# Patient Record
Sex: Female | Born: 1960 | Race: White | Hispanic: No | Marital: Married | Smoking: Never smoker
Health system: Southern US, Community
[De-identification: ages and names within clinical notes are randomized; demographics above are authoritative.]

## PROBLEM LIST (undated history)

## (undated) DIAGNOSIS — K219 Gastro-esophageal reflux disease without esophagitis: Secondary | ICD-10-CM

## (undated) DIAGNOSIS — R319 Hematuria, unspecified: Secondary | ICD-10-CM

## (undated) DIAGNOSIS — Z87898 Personal history of other specified conditions: Secondary | ICD-10-CM

## (undated) DIAGNOSIS — E559 Vitamin D deficiency, unspecified: Secondary | ICD-10-CM

## (undated) DIAGNOSIS — N189 Chronic kidney disease, unspecified: Secondary | ICD-10-CM

## (undated) DIAGNOSIS — F329 Major depressive disorder, single episode, unspecified: Secondary | ICD-10-CM

## (undated) DIAGNOSIS — R5383 Other fatigue: Secondary | ICD-10-CM

## (undated) DIAGNOSIS — F419 Anxiety disorder, unspecified: Secondary | ICD-10-CM

## (undated) DIAGNOSIS — K760 Fatty (change of) liver, not elsewhere classified: Secondary | ICD-10-CM

## (undated) DIAGNOSIS — E785 Hyperlipidemia, unspecified: Secondary | ICD-10-CM

## (undated) DIAGNOSIS — R232 Flushing: Secondary | ICD-10-CM

## (undated) DIAGNOSIS — N6320 Unspecified lump in the left breast, unspecified quadrant: Secondary | ICD-10-CM

## (undated) DIAGNOSIS — N83209 Unspecified ovarian cyst, unspecified side: Secondary | ICD-10-CM

## (undated) DIAGNOSIS — F32A Depression, unspecified: Secondary | ICD-10-CM

## (undated) DIAGNOSIS — N949 Unspecified condition associated with female genital organs and menstrual cycle: Secondary | ICD-10-CM

## (undated) DIAGNOSIS — K589 Irritable bowel syndrome without diarrhea: Secondary | ICD-10-CM

## (undated) DIAGNOSIS — M5126 Other intervertebral disc displacement, lumbar region: Secondary | ICD-10-CM

## (undated) DIAGNOSIS — R1032 Left lower quadrant pain: Secondary | ICD-10-CM

## (undated) HISTORY — DX: Major depressive disorder, single episode, unspecified: F32.9

## (undated) HISTORY — DX: Fatty (change of) liver, not elsewhere classified: K76.0

## (undated) HISTORY — DX: Left lower quadrant pain: R10.32

## (undated) HISTORY — DX: Irritable bowel syndrome, unspecified: K58.9

## (undated) HISTORY — DX: Anxiety disorder, unspecified: F41.9

## (undated) HISTORY — DX: Other intervertebral disc displacement, lumbar region: M51.26

## (undated) HISTORY — DX: Hematuria, unspecified: R31.9

## (undated) HISTORY — DX: Depression, unspecified: F32.A

## (undated) HISTORY — DX: Unspecified condition associated with female genital organs and menstrual cycle: N94.9

## (undated) HISTORY — PX: TUBAL LIGATION: SHX77

## (undated) HISTORY — DX: Chronic kidney disease, unspecified: N18.9

## (undated) HISTORY — DX: Hyperlipidemia, unspecified: E78.5

## (undated) HISTORY — DX: Other fatigue: R53.83

## (undated) HISTORY — DX: Flushing: R23.2

## (undated) HISTORY — DX: Vitamin D deficiency, unspecified: E55.9

## (undated) HISTORY — DX: Unspecified lump in the left breast, unspecified quadrant: N63.20

## (undated) HISTORY — PX: OTHER SURGICAL HISTORY: SHX169

## (undated) HISTORY — PX: ENDOMETRIAL ABLATION: SHX621

## (undated) HISTORY — DX: Unspecified ovarian cyst, unspecified side: N83.209

## (undated) HISTORY — DX: Personal history of other specified conditions: Z87.898

---

## 1996-04-03 DIAGNOSIS — D229 Melanocytic nevi, unspecified: Secondary | ICD-10-CM

## 1996-04-03 HISTORY — DX: Melanocytic nevi, unspecified: D22.9

## 2001-01-03 ENCOUNTER — Other Ambulatory Visit: Admission: RE | Admit: 2001-01-03 | Discharge: 2001-01-03 | Payer: Self-pay | Admitting: Obstetrics and Gynecology

## 2001-12-19 ENCOUNTER — Encounter: Payer: Self-pay | Admitting: Obstetrics and Gynecology

## 2001-12-19 ENCOUNTER — Ambulatory Visit (HOSPITAL_COMMUNITY): Admission: RE | Admit: 2001-12-19 | Discharge: 2001-12-19 | Payer: Self-pay | Admitting: Obstetrics and Gynecology

## 2001-12-22 DIAGNOSIS — C4492 Squamous cell carcinoma of skin, unspecified: Secondary | ICD-10-CM

## 2001-12-22 HISTORY — DX: Squamous cell carcinoma of skin, unspecified: C44.92

## 2003-01-01 ENCOUNTER — Ambulatory Visit (HOSPITAL_COMMUNITY): Admission: RE | Admit: 2003-01-01 | Discharge: 2003-01-01 | Payer: Self-pay | Admitting: Obstetrics and Gynecology

## 2003-01-01 ENCOUNTER — Encounter: Payer: Self-pay | Admitting: Obstetrics and Gynecology

## 2004-01-07 ENCOUNTER — Ambulatory Visit (HOSPITAL_COMMUNITY): Admission: RE | Admit: 2004-01-07 | Discharge: 2004-01-07 | Payer: Self-pay | Admitting: Obstetrics and Gynecology

## 2005-01-11 ENCOUNTER — Ambulatory Visit (HOSPITAL_COMMUNITY): Admission: RE | Admit: 2005-01-11 | Discharge: 2005-01-11 | Payer: Self-pay | Admitting: Obstetrics and Gynecology

## 2005-03-11 ENCOUNTER — Ambulatory Visit (HOSPITAL_COMMUNITY): Admission: RE | Admit: 2005-03-11 | Discharge: 2005-03-11 | Payer: Self-pay | Admitting: Obstetrics and Gynecology

## 2006-01-17 ENCOUNTER — Ambulatory Visit (HOSPITAL_COMMUNITY): Admission: RE | Admit: 2006-01-17 | Discharge: 2006-01-17 | Payer: Self-pay | Admitting: Obstetrics and Gynecology

## 2006-12-06 ENCOUNTER — Ambulatory Visit (HOSPITAL_COMMUNITY): Payer: Self-pay | Admitting: Psychiatry

## 2006-12-12 ENCOUNTER — Ambulatory Visit (HOSPITAL_COMMUNITY): Payer: Self-pay | Admitting: Psychiatry

## 2006-12-20 ENCOUNTER — Ambulatory Visit (HOSPITAL_COMMUNITY): Payer: Self-pay | Admitting: Psychiatry

## 2006-12-27 ENCOUNTER — Ambulatory Visit (HOSPITAL_COMMUNITY): Payer: Self-pay | Admitting: Psychiatry

## 2007-01-11 ENCOUNTER — Ambulatory Visit (HOSPITAL_COMMUNITY): Payer: Self-pay | Admitting: Psychiatry

## 2007-01-20 ENCOUNTER — Ambulatory Visit (HOSPITAL_COMMUNITY): Admission: RE | Admit: 2007-01-20 | Discharge: 2007-01-20 | Payer: Self-pay | Admitting: Obstetrics and Gynecology

## 2007-02-01 ENCOUNTER — Ambulatory Visit (HOSPITAL_COMMUNITY): Payer: Self-pay | Admitting: Psychiatry

## 2007-02-23 ENCOUNTER — Ambulatory Visit (HOSPITAL_COMMUNITY): Payer: Self-pay | Admitting: Psychiatry

## 2007-03-16 ENCOUNTER — Ambulatory Visit (HOSPITAL_COMMUNITY): Payer: Self-pay | Admitting: Psychiatry

## 2007-03-16 ENCOUNTER — Ambulatory Visit (HOSPITAL_COMMUNITY): Admission: RE | Admit: 2007-03-16 | Discharge: 2007-03-16 | Payer: Self-pay | Admitting: Internal Medicine

## 2007-04-24 ENCOUNTER — Ambulatory Visit (HOSPITAL_COMMUNITY): Payer: Self-pay | Admitting: Psychiatry

## 2007-08-22 ENCOUNTER — Ambulatory Visit (HOSPITAL_COMMUNITY): Admission: RE | Admit: 2007-08-22 | Discharge: 2007-08-22 | Payer: Self-pay | Admitting: Internal Medicine

## 2008-01-30 ENCOUNTER — Ambulatory Visit (HOSPITAL_COMMUNITY): Admission: RE | Admit: 2008-01-30 | Discharge: 2008-01-30 | Payer: Self-pay | Admitting: Obstetrics and Gynecology

## 2008-03-01 ENCOUNTER — Other Ambulatory Visit: Admission: RE | Admit: 2008-03-01 | Discharge: 2008-03-01 | Payer: Self-pay | Admitting: Obstetrics and Gynecology

## 2008-03-25 ENCOUNTER — Ambulatory Visit (HOSPITAL_COMMUNITY): Admission: RE | Admit: 2008-03-25 | Discharge: 2008-03-25 | Payer: Self-pay | Admitting: Obstetrics and Gynecology

## 2008-04-15 ENCOUNTER — Other Ambulatory Visit: Admission: RE | Admit: 2008-04-15 | Discharge: 2008-04-15 | Payer: Self-pay | Admitting: Obstetrics and Gynecology

## 2008-05-02 ENCOUNTER — Ambulatory Visit (HOSPITAL_COMMUNITY): Admission: RE | Admit: 2008-05-02 | Discharge: 2008-05-02 | Payer: Self-pay | Admitting: Obstetrics and Gynecology

## 2009-02-04 ENCOUNTER — Ambulatory Visit (HOSPITAL_COMMUNITY): Admission: RE | Admit: 2009-02-04 | Discharge: 2009-02-04 | Payer: Self-pay | Admitting: Obstetrics and Gynecology

## 2009-03-11 ENCOUNTER — Other Ambulatory Visit: Admission: RE | Admit: 2009-03-11 | Discharge: 2009-03-11 | Payer: Self-pay | Admitting: Obstetrics and Gynecology

## 2010-02-05 ENCOUNTER — Ambulatory Visit (HOSPITAL_COMMUNITY): Admission: RE | Admit: 2010-02-05 | Discharge: 2010-02-05 | Payer: Self-pay | Admitting: Obstetrics and Gynecology

## 2010-03-18 ENCOUNTER — Other Ambulatory Visit: Admission: RE | Admit: 2010-03-18 | Discharge: 2010-03-18 | Payer: Self-pay | Admitting: Obstetrics and Gynecology

## 2010-10-14 ENCOUNTER — Ambulatory Visit (HOSPITAL_COMMUNITY)
Admission: RE | Admit: 2010-10-14 | Discharge: 2010-10-14 | Payer: Self-pay | Source: Home / Self Care | Attending: Psychiatry | Admitting: Psychiatry

## 2010-10-20 ENCOUNTER — Ambulatory Visit (HOSPITAL_COMMUNITY)
Admission: RE | Admit: 2010-10-20 | Discharge: 2010-10-20 | Payer: Self-pay | Source: Home / Self Care | Attending: Psychiatry | Admitting: Psychiatry

## 2010-10-27 ENCOUNTER — Ambulatory Visit (HOSPITAL_COMMUNITY)
Admission: RE | Admit: 2010-10-27 | Discharge: 2010-10-27 | Payer: Self-pay | Source: Home / Self Care | Attending: Psychiatry | Admitting: Psychiatry

## 2010-11-03 ENCOUNTER — Encounter (INDEPENDENT_AMBULATORY_CARE_PROVIDER_SITE_OTHER): Payer: No Typology Code available for payment source | Admitting: Psychiatry

## 2010-11-03 DIAGNOSIS — F329 Major depressive disorder, single episode, unspecified: Secondary | ICD-10-CM

## 2010-11-10 ENCOUNTER — Encounter (INDEPENDENT_AMBULATORY_CARE_PROVIDER_SITE_OTHER): Payer: No Typology Code available for payment source | Admitting: Psychiatry

## 2010-11-10 DIAGNOSIS — F329 Major depressive disorder, single episode, unspecified: Secondary | ICD-10-CM

## 2010-11-24 ENCOUNTER — Encounter (INDEPENDENT_AMBULATORY_CARE_PROVIDER_SITE_OTHER): Payer: No Typology Code available for payment source | Admitting: Psychiatry

## 2010-11-24 DIAGNOSIS — F329 Major depressive disorder, single episode, unspecified: Secondary | ICD-10-CM

## 2010-12-08 ENCOUNTER — Encounter (INDEPENDENT_AMBULATORY_CARE_PROVIDER_SITE_OTHER): Payer: No Typology Code available for payment source | Admitting: Psychiatry

## 2010-12-08 DIAGNOSIS — F329 Major depressive disorder, single episode, unspecified: Secondary | ICD-10-CM

## 2011-01-05 ENCOUNTER — Encounter (INDEPENDENT_AMBULATORY_CARE_PROVIDER_SITE_OTHER): Payer: No Typology Code available for payment source | Admitting: Psychiatry

## 2011-01-05 DIAGNOSIS — F329 Major depressive disorder, single episode, unspecified: Secondary | ICD-10-CM

## 2011-01-20 ENCOUNTER — Other Ambulatory Visit: Payer: Self-pay | Admitting: Obstetrics and Gynecology

## 2011-01-20 DIAGNOSIS — Z139 Encounter for screening, unspecified: Secondary | ICD-10-CM

## 2011-02-08 ENCOUNTER — Ambulatory Visit (HOSPITAL_COMMUNITY)
Admission: RE | Admit: 2011-02-08 | Discharge: 2011-02-08 | Disposition: A | Discharge status: Home / Self Care | Payer: BC Managed Care – PPO | Source: Ambulatory Visit | Attending: Obstetrics and Gynecology | Admitting: Obstetrics and Gynecology

## 2011-02-08 DIAGNOSIS — Z139 Encounter for screening, unspecified: Secondary | ICD-10-CM

## 2011-02-08 DIAGNOSIS — Z1231 Encounter for screening mammogram for malignant neoplasm of breast: Secondary | ICD-10-CM | POA: Insufficient documentation

## 2011-02-09 NOTE — Op Note (Signed)
[name], Erica Lynch              ACCOUNT NO.:  [id number]   MEDICAL RECORD NO.:  [id number]          PATIENT TYPE:  AMB   LOCATION:  DAY                           FACILITY:  [facility]   PHYSICIAN:  Tilda Burrow, M.D. DATE OF BIRTH:  15-May-1961   DATE OF PROCEDURE:  05/02/2008  DATE OF DISCHARGE:                               OPERATIVE REPORT   PREOPERATIVE DIAGNOSES:  1. Menorrhagia.  2. Dysmenorrhea.   POSTOPERATIVE DIAGNOSES:  1. Menorrhagia.  2. Dysmenorrhea.   PROCEDURES:  1. Hysteroscopy.  2. Dilatation and curettage.  3. Endometrial ablation.   SURGEON:  Tilda Burrow, MD   ASSISTANTAmie Critchley, CST   ANESTHESIA:  General with LMA and paracervical block.   COMPLICATIONS:  None.   FINDINGS:  Uterus sounding to 8 cm, slightly more elongated at right  cornual area than left.  No visible areas of pathology.  Smooth  endometrial cavity as the patient is on her menses.  She is completing  her menses.   DETAILS OF PROCEDURE:  The patient was prepped and draped for vaginal  procedure.  Cervix grasped with single-tooth tenaculum.  Uterus sounded  in the anteflex position to 8 cm, dilated to 23-French, allowing  introduction of the 30-degree rigid hysteroscope which showed thin  endometrial surfaces, diffuse, and oozing from the tubes of the spiral  arteries and no evidence of tissue build up of any sort.  Smooth and  sharp curettage was performed briefly and no tissue fragments of  significance were obtained.  There was nothing to send for pathology.  Brief repeat hysteroscopy was performed to confirm that there were no  clots in the uterine cavity, to interfere with the procedure, and then  we proceeded with the Gynecare ThermaChoice III hysteroscopy,  endometrial ablation device testing it and inserting it into the uterus  to a depth of 8 cm, inflating it to 8 mL with D5W at 170 mmHg pressure.  The thermal ablation sequence was initiated.  After temperature  elevation of 87degrees, the patient had a 5 minute 45 second scalding  procedure performed.  Towards the end of the scalding sequence, the  intrauterine pressure began to increase.  There was no movement of the  catheter itself due to myometrial spasms.  This reached 200 mmHg and  resulted in automatic shutoff of the thermal sequence.  It was felt that  this was closed enough to completion of the normal 8-minute cycle with  no additional cautery was necessary.  The fluid was extracted now only 8  mL recovered.  Paracervical block was then placed with 20 mL of 1%  Marcaine with epinephrine and the patient allowed to go to recovery room  in stable condition with minimal blood loss, and the patient in good  comfort when reevaluated upon arrival to the recovery area.  Sponge and  needle counts were correct.  The patient will be discharged home on  Percocet x20 tablets and Motrin x30 tablets.      Tilda Burrow, M.D.  Electronically Signed     JVF/MEDQ  D:  05/02/2008  T:  05/03/2008  Job:  [id number]   cc:   [facility]

## 2011-02-09 NOTE — H&P (Signed)
NAME:  Erica Lynch, Erica Lynch              ACCOUNT NO.:  [id number]   MEDICAL RECORD NO.:  [id number]          PATIENT TYPE:  AMB   LOCATION:  DAY                           FACILITY:  [facility]   PHYSICIAN:  Tilda Burrow, M.D. DATE OF BIRTH:  04-25-1961   DATE OF ADMISSION:  DATE OF DISCHARGE:  LH                              HISTORY & PHYSICAL   PREOPERATIVE DIAGNOSES:  1. Menorrhagia.  2. Dysmenorrhea, currently managed on oral contraceptives.   HISTORY OF PRESENT ILLNESS:  This 50 year old female status post tubal  ligation, status post umbilical herniorrhaphy with an otherwise benign  GYN.  Course has been seen in our office for counseling regarding her  heavy and uncomfortable periods.  She has been referred to our office.  She was a patient of Dr. Carylon Perches.  She is having unacceptably heavy  periods that have lots of cramps associated with a menstrual flow.  It  lasts 5-7 days when on oral contraceptives.  Currently, she is on Junel  FE 1.5/30 taking one daily and has light menstrual periods,  which is  still after oral contraceptives.  She is reviewed Gynecare ThermaChoice,  endometrial ablation device, and brochures and desires to proceed with  the above procedure.  There is an ultrasound of the uterus, which shows  a small uterus measuring  9.1 x 4.8 x 6.6 cm with a 3 and 9-mm  endometrial stripe, considered normal with Bartholin cyst to the cervix  and no adnexal pathology.  Small ovaries are seen bilaterally.  She has  had a Pap smear, which was ascus with a negative HPV and negative GC and  chlamydia culture.  Risks of procedure have been reviewed.  Questions  answered to the patient's satisfaction.   PAST MEDICAL HISTORY:  Benign.   SURGICAL HISTORY:  D&C, tubal ligation, and umbilical herniorrhaphy on  March 02, 2005.   No known drug allergies.   PHYSICAL EXAM:  GENERAL:  Shows an anxious but appropriate healthy  Caucasian female alert and oriented x3.  HEENT: Pupils  equal, round, and reactive.  NECK:  Supple.  CARDIOVASCULAR EXAM: Unremarkable.  ABDOMEN: Moderate obesity without masses.  EXTERNAL GENITALIA: Multiparous.  Cervix smooth well supported.  Adnexa  negative for masses.  Uterus sounds to 8 cm with a small endometrial  biopsy performed under paracervical block on April 15, 2008, results are  please __________.   PLAN:  Hysteroscopy, D&C, and endometrial ablation using Gynecare  ThermaChoice III device on May 02, 2008, at [facility].      Tilda Burrow, M.D.     JVF/MEDQ  D:  04/15/2008  T:  04/16/2008  Job:  [id number]   cc:   Kingsley Callander. Ouida Sills, MD  Fax: [phone]

## 2011-02-09 NOTE — H&P (Signed)
NAME:  Erica Lynch, Erica Lynch              ACCOUNT NO.:  [id number]   MEDICAL RECORD NO.:  [id number]          PATIENT TYPE:  AMB   LOCATION:  DAY                           FACILITY:  [facility]   PHYSICIAN:  Tilda Burrow, M.D. DATE OF BIRTH:  02-12-1961   DATE OF ADMISSION:  DATE OF DISCHARGE:  LH                              HISTORY & PHYSICAL   PREOPERATIVE DIAGNOSES:  1. Menorrhagia.  2. Dysmenorrhea, currently managed on oral contraceptives.   HISTORY OF PRESENT ILLNESS:  This 50 year old female status post tubal  ligation, status post umbilical herniorrhaphy with an otherwise benign  GYN.  Course has been seen in our office for counseling regarding her  heavy and uncomfortable periods.  She has been referred to our office.  She was a patient of Dr. Carylon Perches.  She is having unacceptably heavy  periods that have lots of cramps associated with a menstrual flow.  It  lasts 5-7 days when on oral contraceptives.  Currently, she is on Junel  FE 1.5/30 taking one daily and has light menstrual periods,  which is  still after oral contraceptives.  She is reviewed Gynecare ThermaChoice,  endometrial ablation device, and brochures and desires to proceed with  the above procedure.  There is an ultrasound of the uterus, which shows  a small uterus measuring  9.1 x 4.8 x 6.6 cm with a 3 and 9-mm  endometrial stripe, considered normal with Bartholin cyst to the cervix  and no adnexal pathology.  Small ovaries are seen bilaterally.  She has  had a Pap smear, which was ascus with a negative HPV and negative GC and  chlamydia culture.  Risks of procedure have been reviewed.  Questions  answered to the patient's satisfaction.   PAST MEDICAL HISTORY:  Benign.   SURGICAL HISTORY:  D&C, tubal ligation, and umbilical herniorrhaphy on  March 02, 2005.   No known drug allergies.   PHYSICAL EXAM:  GENERAL:  Shows an anxious but appropriate healthy  Caucasian female alert and oriented x3.  HEENT: Pupils  equal, round, and reactive.  NECK:  Supple.  CARDIOVASCULAR EXAM: Unremarkable.  ABDOMEN: Moderate obesity without masses.  EXTERNAL GENITALIA: Multiparous.  Cervix smooth well supported.  Adnexa  negative for masses.  Uterus sounds to 8 cm with a small endometrial  biopsy performed under paracervical block on April 15, 2008, results are  __________.   PLAN:  Hysteroscopy, D&C, and endometrial ablation using Gynecare  ThermaChoice III device on May 02, 2008, at [facility].      Tilda Burrow, M.D.  Electronically Signed     JVF/MEDQ  D:  04/15/2008  T:  04/16/2008  Job:  [id number]   cc:   Kingsley Callander. Ouida Sills, MD  Fax: [phone]

## 2011-02-12 NOTE — Op Note (Signed)
[name], Erica Lynch              ACCOUNT NO.:  [id number]   MEDICAL RECORD NO.:  [id number]          PATIENT TYPE:  AMB   LOCATION:  DAY                           FACILITY:  [facility]   PHYSICIAN:  Tilda Burrow, M.D. DATE OF BIRTH:  [birth year]   DATE OF PROCEDURE:  03/11/2005  DATE OF DISCHARGE:                                 OPERATIVE REPORT   PREOPERATIVE DIAGNOSES:  1.  Elective sterilization.  2.  Small umbilical hernia.   POSTOPERATIVE DIAGNOSES:  1.  Elective sterilization.  2.  Small umbilical hernia.   POSTOPERATIVE DIAGNOSES:  1.  Laparoscopic tubal sterilization with Falope rings.  2.  Umbilical herniorrhaphy.   SURGEON:  Tilda Burrow, M.D.   ASSISTANT:  None.   ANESTHESIA:  General.   COMPLICATIONS:  None.   FINDINGS:  A small 1.5 cm umbilical hernia, subjectively tender to patient,  requesting correction at the time of tubal sterilization.  Normal tubes and  ovaries bilaterally.   DETAILS OF PROCEDURE:  The patient was taken to the operating room and  prepped and draped for combined abdominal and vaginal procedure with Hulka  tenaculum attached to the cervix, in-and-out catheterization of the bladder  performed, and the attention directed to the umbilicus, where an  intraumbilical 1.5 cm incision is made through the skin, being careful to  elevate it off of the underlying superficial hernia tissue sac.  The Veress  needle was introduced, and there was only extreme ease in identifying the  peritoneal space.  Pneumoperitoneum under 8 mmHg was achieved x3 L CO2,  followed by easy introduction of the camera and laparoscopic trocar under  direct visualization without difficulty.  Inspection of the pelvis showed no  evidence of bleeding or trauma.  The suprapubic 8 mm trocar was placed under  direct visualization as well.  Attention was directed to the left tube and  ovary, which were grasped and a single ring placed on the midsegment of the  left fallopian  tube with photo documentation at photo #1.  We then proceeded  with placement of ring in similar fashion on the right fallopian tube.  Both  tubes were infiltrated with Marcaine solution both above and below the ring  to improve pain management postop.  We then considered the procedure  completed successfully and addressed the hernia.   A camera was placed through the suprapubic port and a photo taken of the  insertion of the umbilical trocar.  There were no adhesions surrounding it.  We instilled 200 mL saline and removed the laparoscopic equipment.  We took  a photo of the right tube and ovary prior to placing saline.  There was a  little bit of oozing from the needle sticks through the abdominal wall for  anesthesia placement.   Umbilical hernia repair consisted of identifying the fascial defect, a 1.5  cm long firm-edged fascial defect which was longstanding.  The smooth  serosal tissue was trimmed off the surface of the fascial edges and the  protruding subfascial fat was excised.  We then pulled the fascia together  with three separate interrupted  0 Vicryl sutures, resulting in complete  satisfactory closure of the defect.  The subcuticular closure of the skin  then followed with minimal blood loss.  The suprapubic site was closed with  subcuticular 4-0 Dexon as well.  The patient went to the recovery room in  good condition.  Sponge and needle counts were correct.       JVF/MEDQ  D:  03/11/2005  T:  03/11/2005  Job:  [id number]

## 2011-02-12 NOTE — H&P (Signed)
NAME:  Erica Lynch, Erica Lynch              ACCOUNT NO.:  [id number]   MEDICAL RECORD NO.:  [id number]          PATIENT TYPE:  AMB   LOCATION:  DAY                           FACILITY:  [facility]   PHYSICIAN:  Tilda Burrow, M.D. DATE OF BIRTH:  03-16-1961   DATE OF ADMISSION:  DATE OF DISCHARGE:  LH                                HISTORY & PHYSICAL   ADMISSION DIAGNOSES:  1.  Desire for elective permanent sterilization.  2.  Umbilical hernia.   HISTORY OF PRESENT ILLNESS:  This 50 year old female was admitted for  elective permanent sterilization.  She has been seen in our office and  desires to make permanent her unwavering desire for no further children.  She is not on birth control pills and cannot take them due to increased  moodiness.  She describes her menses as light.  She first sought care in  April to discuss this and will proceed at this time with permanent  sterilization.   PAST MEDICAL HISTORY:  Benign.   PAST SURGICAL HISTORY:  Negative.   HABITS:  Alcohol:  Rarely, social.  Cigarettes:  Denied.  Recreational  drugs:  Denied.   PHYSICAL EXAMINATION:  GENERAL:  Shows a healthy-appearing Caucasian female.  VITAL SIGNS:  Weight 199-1/2 pounds, blood pressure 120/70.  HEENT:  Pupils equal, round, and reactive.  Extraocular movements intact.  NECK:  Supple.  CARDIOVASCULAR:  Exam unremarkable.  ABDOMEN:  Nontender.  There is a small 1 cm central umbilical hernia which  is tender to touch.  EXTERNAL GENITALIA:  Normal.  Vaginal exam normal secretions and cervix:  Class I Pap smear performed January 21, 2005.  Uterus anterior, normal size,  shape, and contour.  Adnexa negative for masses.   IMPRESSION:  Desire for elective permanent sterilization.   PLAN:  Laparoscopic tubal sterilization, __________June 2, 2006.  Also will  perform a simple closure of the tiny umbilical hernia.       JVF/MEDQ  D:  03/10/2005  T:  03/10/2005  Job:  [id number]   cc:   Tilda Burrow, M.D.  Fax: [phone]

## 2011-03-10 ENCOUNTER — Emergency Department (HOSPITAL_COMMUNITY): Payer: BC Managed Care – PPO

## 2011-03-10 ENCOUNTER — Emergency Department (HOSPITAL_COMMUNITY)
Admission: EM | Admit: 2011-03-10 | Discharge: 2011-03-10 | Disposition: A | Discharge status: Home / Self Care | Payer: BC Managed Care – PPO | Attending: Emergency Medicine | Admitting: Emergency Medicine

## 2011-03-10 DIAGNOSIS — F329 Major depressive disorder, single episode, unspecified: Secondary | ICD-10-CM | POA: Insufficient documentation

## 2011-03-10 DIAGNOSIS — R079 Chest pain, unspecified: Secondary | ICD-10-CM | POA: Insufficient documentation

## 2011-03-10 DIAGNOSIS — F411 Generalized anxiety disorder: Secondary | ICD-10-CM | POA: Insufficient documentation

## 2011-03-10 DIAGNOSIS — F3289 Other specified depressive episodes: Secondary | ICD-10-CM | POA: Insufficient documentation

## 2011-03-10 LAB — CK TOTAL AND CKMB (NOT AT ARMC)
CK, MB: 1.9 ng/mL (ref 0.3–4.0)
Relative Index: INVALID (ref 0.0–2.5)

## 2011-03-10 LAB — TROPONIN I: Troponin I: <0.3 ng/mL (ref <0.30)

## 2011-03-10 LAB — COMPREHENSIVE METABOLIC PANEL
ALT: 13 U/L (ref 0–35)
BUN: 22 mg/dL (ref 6–23)
GFR calc Af Amer: >60 mL/min (ref >60)
Sodium: 139 mEq/L (ref 135–145)
Total Protein: 7 g/dL (ref 6.0–8.3)

## 2011-05-05 ENCOUNTER — Other Ambulatory Visit: Payer: Self-pay | Admitting: Obstetrics and Gynecology

## 2011-05-05 ENCOUNTER — Other Ambulatory Visit (HOSPITAL_COMMUNITY)
Admission: RE | Admit: 2011-05-05 | Discharge: 2011-05-05 | Disposition: A | Discharge status: Home / Self Care | Payer: BC Managed Care – PPO | Source: Ambulatory Visit | Attending: Obstetrics and Gynecology | Admitting: Obstetrics and Gynecology

## 2011-05-05 DIAGNOSIS — R87810 Cervical high risk human papillomavirus (HPV) DNA test positive: Secondary | ICD-10-CM | POA: Insufficient documentation

## 2011-05-05 DIAGNOSIS — Z124 Encounter for screening for malignant neoplasm of cervix: Secondary | ICD-10-CM | POA: Insufficient documentation

## 2011-06-25 LAB — CBC
HCT: 41
Hemoglobin: 13.7
MCHC: 33.5
Platelets: 386

## 2011-06-25 LAB — HCG, QUANTITATIVE, PREGNANCY: hCG, Beta Chain, Quant, S: <2

## 2011-07-28 ENCOUNTER — Telehealth: Payer: Self-pay

## 2011-07-28 NOTE — Telephone Encounter (Signed)
LMOM for a return call on 07/27/2011.

## 2011-08-03 NOTE — Telephone Encounter (Signed)
Pt is scheduled an OV on 08/04/2011 @ 8:30 Am with Lorenza Burton, NP, due to med list.

## 2011-08-03 NOTE — Telephone Encounter (Signed)
LMOM to call.

## 2011-08-04 ENCOUNTER — Ambulatory Visit (INDEPENDENT_AMBULATORY_CARE_PROVIDER_SITE_OTHER): Payer: BC Managed Care – PPO | Admitting: Urgent Care

## 2011-08-04 ENCOUNTER — Encounter: Payer: Self-pay | Admitting: Urgent Care

## 2011-08-04 VITALS — BP 110/75 | HR 76 | Temp 97.4°F | Ht 65.0 in | Wt 175.8 lb

## 2011-08-04 DIAGNOSIS — Z1211 Encounter for screening for malignant neoplasm of colon: Secondary | ICD-10-CM | POA: Insufficient documentation

## 2011-08-04 NOTE — Progress Notes (Signed)
Referring Provider: Dr. Ouida Sills Primary Care Physician:  Carylon Perches, MD Primary Gastroenterologist:  Dr. Jena Gauss  Chief Complaint  Patient presents with  . Colonoscopy   HPI:  Erica Lynch is a 50 y.o. female here as a referral from Dr. Ouida Sills for screening colonoscopy.  She was brought into the office today due to her multiple psychoactive medications to discuss propofol for her procedure.  Wt loss 30# intentionally by cutting out extra calories.   Denies any upper GI symptoms including heartburn, indigestion, nausea, vomiting, dysphagia, odynophagia or anorexia.   Denies any lower GI symptoms including constipation, diarrhea, rectal bleeding, melena or weight loss.  Past Medical History  Diagnosis Date  . IBS (irritable bowel syndrome)   . Anxiety   . Hyperlipidemia    Past Surgical History  Procedure Date  . Cesarean section 1996  . Tubal ligation   . Other surgical history     Uterine ablation    Current Outpatient Prescriptions  Medication Sig Dispense Refill  . ALUVEA 39 % CREA Apply 1 application topically Nightly.      Marland Kitchen buPROPion (WELLBUTRIN XL) 300 MG 24 hr tablet Take 300 mg by mouth every morning.       . clonazePAM (KLONOPIN) 0.5 MG tablet Take 0.5 mg by mouth 2 (two) times daily as needed.        Marland Kitchen FLUoxetine (PROZAC) 20 MG capsule 60 mg.         Allergies as of 08/04/2011  . (No Known Allergies)    Family History  Problem Relation Age of Onset  . Lung cancer Mother   . Cirrhosis Father     etoh     History   Social History  . Marital Status: Married    Spouse Name: N/A    Number of Children: 1  . Years of Education: N/A   Occupational History  . [employer]    Social History Main Topics  . Smoking status: Never Smoker   . Smokeless tobacco: Not on file  . Alcohol Use: Yes     glass of wine once a month sometime not at all  . Drug Use: No  . Sexually Active: Not on file   Other Topics Concern  . Not on file   Social History Narrative   1 son-16    Review of Systems: Gen: Denies any fever, chills, sweats, anorexia, fatigue, weakness, malaise, weight loss, and sleep disorder CV: Denies chest pain, angina, palpitations, syncope, orthopnea, PND, peripheral edema, and claudication. Resp: Denies dyspnea at rest, dyspnea with exercise, cough, sputum, wheezing, coughing up blood, and pleurisy. GI: Denies vomiting blood, jaundice, and fecal incontinence.   Denies dysphagia or odynophagia. GU : Denies urinary burning, blood in urine, urinary frequency, urinary hesitancy, nocturnal urination, and urinary incontinence. MS: Denies joint pain, limitation of movement, and swelling, stiffness, low back pain, extremity pain. Denies muscle weakness, cramps, atrophy.  Derm: Denies rash, itching, dry skin, hives, moles, warts, or unhealing ulcers.  Psych: History of chronic anxiety controlled on above medications. Denies depression, memory loss, suicidal ideation, hallucinations, paranoia, and confusion. Heme: Denies bruising, bleeding, and enlarged lymph nodes.  Physical Exam: BP 110/75  Pulse 76  Temp(Src) 97.4 F (36.3 C) (Temporal)  Ht 5\' 5"  (1.651 m)  Wt 175 lb 12.8 oz (79.742 kg)  BMI 29.25 kg/m2 General:   Alert,  Well-developed, well-nourished, pleasant and cooperative in NAD Head:  Normocephalic and atraumatic. Eyes:  Sclera clear, no icterus.   Conjunctiva pink. Ears:  Normal auditory acuity. Nose:  No deformity, discharge,  or lesions. Mouth:  No deformity or lesions, oropharynx pink and moist. Neck:  Supple; no masses or thyromegaly. Lungs:  Clear throughout to auscultation.   No wheezes, crackles, or rhonchi. No acute distress. Heart:  Regular rate and rhythm; no murmurs, clicks, rubs,  or gallops. Abdomen:  Soft, nontender and nondistended. No masses, hepatosplenomegaly or hernias noted. Normal bowel sounds, without guarding, and without rebound.   Rectal:  Deferred until time of colonoscopy.   Msk:  Symmetrical without  gross deformities. Normal posture. Pulses:  Normal pulses noted. Extremities:  Without clubbing or edema. Neurologic:  Alert and  oriented x4;  grossly normal neurologically. Skin:  Intact without significant lesions or rashes. Cervical Nodes:  No significant cervical adenopathy. Psych:  Alert and cooperative. Normal mood and affect.

## 2011-08-04 NOTE — Assessment & Plan Note (Signed)
Erica Lynch is a 50 y.o. female for screening colonoscopy. She has no alarm features. She was brought in the office today to discuss the use of propofol for her procedure given her multiple medications for anxiety.  I have discussed risks & benefits which include, but are not limited to, bleeding, infection, perforation & drug reaction.  The patient agrees with this plan & written consent will be obtained.   Procedure will need to be done with deep sedation (propofol) in the OR under the direction of anesthesia services for multiple psychoactive medications.

## 2011-08-04 NOTE — Progress Notes (Signed)
Cc to PCP 

## 2011-08-04 NOTE — Patient Instructions (Signed)
Keep colonoscopy as planned 

## 2011-08-10 ENCOUNTER — Other Ambulatory Visit: Payer: Self-pay | Admitting: Gastroenterology

## 2011-08-10 DIAGNOSIS — Z1211 Encounter for screening for malignant neoplasm of colon: Secondary | ICD-10-CM

## 2011-08-28 HISTORY — PX: COLONOSCOPY: SHX174

## 2011-08-31 ENCOUNTER — Telehealth: Payer: Self-pay | Admitting: Gastroenterology

## 2011-08-31 NOTE — Telephone Encounter (Signed)
Okay for colonoscopy in OR with propofol as planned

## 2011-08-31 NOTE — Telephone Encounter (Signed)
Pt was seen by you on 08/04/11- could not get her scheduled until 12/13 in the OR due to RMR schedule being full and him on vacation

## 2011-08-31 NOTE — Telephone Encounter (Signed)
Patient needs office visit prior to colonoscopy given multiple psychoactive meds

## 2011-08-31 NOTE — Telephone Encounter (Signed)
Gastroenterology Pre-Procedure Form  Request Date: 08/31/11,     PATIENT INFORMATION:  Erica Lynch is a 50 y.o., female (DOB=10-Jun-1961).  PROCEDURE: Procedure(s) requested: colonoscopy Procedure Reason: screening for colon cancer  PATIENT REVIEW QUESTIONS: The patient reports the following:   1. Diabetes Melitis: no 2. Joint replacements in the past 12 months: no 3. Major health problems in the past 3 months: no 4. Has an artificial valve or MVP:no 5. Has been advised in past to take antibiotics in advance of a procedure like teeth cleaning: no}    MEDICATIONS & ALLERGIES:    Patient reports the following regarding taking any blood thinners:   Plavix? no Aspirin?no Coumadin?  no  Patient confirms/reports the following medications:  Current Outpatient Prescriptions  Medication Sig Dispense Refill  . ALUVEA 39 % CREA Apply 1 application topically Nightly.      Marland Kitchen buPROPion (WELLBUTRIN XL) 300 MG 24 hr tablet Take 300 mg by mouth every morning.       . clonazePAM (KLONOPIN) 0.5 MG tablet Take 0.5 mg by mouth 2 (two) times daily as needed.        Marland Kitchen FLUoxetine (PROZAC) 20 MG capsule 60 mg.         Patient confirms/reports the following allergies:  No Known Allergies  Patient is appropriate to schedule for requested procedure(s): yes   No orders of the defined types were placed in this encounter.    SCHEDULE INFORMATION: Procedure has been scheduled as follows:  Date: 09/09/11, Time:   Location: [facility]   This Gastroenterology Pre-Precedure Form is being routed to the following provider(s) for review: R. Roetta Sessions, MD

## 2011-08-31 NOTE — Telephone Encounter (Signed)
Pt was seen by you on 08/04/11- could not get her scheduled until 12/13 in the OR due to RMR schedule being full and him on vacation  

## 2011-09-03 ENCOUNTER — Encounter (HOSPITAL_COMMUNITY)
Admission: RE | Admit: 2011-09-03 | Discharge: 2011-09-03 | Disposition: A | Discharge status: Home / Self Care | Payer: BC Managed Care – PPO | Source: Ambulatory Visit | Attending: Internal Medicine | Admitting: Internal Medicine

## 2011-09-03 ENCOUNTER — Encounter (HOSPITAL_COMMUNITY): Payer: Self-pay | Admitting: Pharmacy Technician

## 2011-09-03 ENCOUNTER — Encounter (HOSPITAL_COMMUNITY): Payer: Self-pay

## 2011-09-03 LAB — CBC
Hemoglobin: 13.8 g/dL (ref 12.0–15.0)
MCHC: 34.1 g/dL (ref 30.0–36.0)
Platelets: 334 10*3/uL (ref 150–400)
WBC: 6.6 10*3/uL (ref 4.0–10.5)

## 2011-09-03 LAB — BASIC METABOLIC PANEL
BUN: 18 mg/dL (ref 6–23)
Calcium: 9.8 mg/dL (ref 8.4–10.5)
Creatinine, Ser: 0.92 mg/dL (ref 0.50–1.10)
Sodium: 139 mEq/L (ref 135–145)

## 2011-09-03 NOTE — Patient Instructions (Addendum)
19 Erica Lynch  09/03/2011   Your procedure is scheduled on:  09/09/2011  Report to [facility] at 11:20 AM.  Call this number if you have problems the morning of surgery: [phone]   Remember:   Do not eat food:After Midnight.  May have clear liquids:until Midnight .  Clear liquids include soda, tea, black coffee, apple or grape juice, broth.  Take these medicines the morning of surgery with A SIP OF WATER:klonopin,lwellbutrin,prozac   Do not wear jewelry, make-up or nail polish.  Do not wear lotions, powders, or perfumes. You may wear deodorant.  Do not shave 48 hours prior to surgery.  Do not bring valuables to the hospital.  Contacts, dentures or bridgework may not be worn into surgery.  Leave suitcase in the car. After surgery it may be brought to your room.  For patients admitted to the hospital, checkout time is 11:00 AM the day of discharge.   Patients discharged the day of surgery will not be allowed to drive home.  Name and phone number of your driver:   Special Instructions: N/A   Please read over the following fact sheets that you were given: Anesthesia Post-op Instructions     Colonoscopy A colonoscopy is an exam to evaluate your entire colon. In this exam, your colon is cleansed. A long fiberoptic tube is inserted through your rectum and into your colon. The fiberoptic scope (endoscope) is a long bundle of enclosed and very flexible fibers. These fibers transmit light to the area examined and send images from that area to your caregiver. Discomfort is usually minimal. You may be given a drug to help you sleep (sedative) during or prior to the procedure. This exam helps to detect lumps (tumors), polyps, inflammation, and areas of bleeding. Your caregiver may also take a small piece of tissue (biopsy) that will be examined under a microscope. LET YOUR CAREGIVER KNOW ABOUT:   Allergies to food or medicine.   Medicines taken, including vitamins, herbs, eyedrops,  over-the-counter medicines, and creams.   Use of steroids (by mouth or creams).   Previous problems with anesthetics or numbing medicines.   History of bleeding problems or blood clots.   Previous surgery.   Other health problems, including diabetes and kidney problems.   Possibility of pregnancy, if this applies.  BEFORE THE PROCEDURE   A clear liquid diet may be required for 2 days before the exam.   Ask your caregiver about changing or stopping your regular medications.   Liquid injections (enemas) or laxatives may be required.   A large amount of electrolyte solution may be given to you to drink over a short period of time. This solution is used to clean out your colon.   You should be present 60 minutes prior to your procedure or as directed by your caregiver.  AFTER THE PROCEDURE   If you received a sedative or pain relieving medication, you will need to arrange for someone to drive you home.   Occasionally, there is a little blood passed with the first bowel movement. Do not be concerned.  FINDING OUT THE RESULTS OF YOUR TEST Not all test results are available during your visit. If your test results are not back during the visit, make an appointment with your caregiver to find out the results. Do not assume everything is normal if you have not heard from your caregiver or the medical facility. It is important for you to follow up on all of your test results. HOME  CARE INSTRUCTIONS   It is not unusual to pass moderate amounts of gas and experience mild abdominal cramping following the procedure. This is due to air being used to inflate your colon during the exam. Walking or a warm pack on your belly (abdomen) may help.   You may resume all normal meals and activities after sedatives and medicines have worn off.   Only take over-the-counter or prescription medicines for pain, discomfort, or fever as directed by your caregiver. Do not use aspirin or blood thinners if a biopsy  was taken. Consult your caregiver for medicine usage if biopsies were taken.  SEEK IMMEDIATE MEDICAL CARE IF:   You have a fever.   You pass large blood clots or fill a toilet with blood following the procedure. This may also occur 10 to 14 days following the procedure. This is more likely if a biopsy was taken.   You develop abdominal pain that keeps getting worse and cannot be relieved with medicine.  Document Released: 09/10/2000 Document Revised: 05/26/2011 Document Reviewed: 04/25/2008 [facility] Patient Information 2012 [place], [place].    PATIENT INSTRUCTIONS POST-ANESTHESIA  IMMEDIATELY FOLLOWING SURGERY:  Do not drive or operate machinery for the first twenty four hours after surgery.  Do not make any important decisions for twenty four hours after surgery or while taking narcotic pain medications or sedatives.  If you develop intractable nausea and vomiting or a severe headache please notify your doctor immediately.  FOLLOW-UP:  Please make an appointment with your surgeon as instructed. You do not need to follow up with anesthesia unless specifically instructed to do so.  WOUND CARE INSTRUCTIONS (if applicable):  Keep a dry clean dressing on the anesthesia/puncture wound site if there is drainage.  Once the wound has quit draining you may leave it open to air.  Generally you should leave the bandage intact for twenty four hours unless there is drainage.  If the epidural site drains for more than 36-48 hours please call the anesthesia department.  QUESTIONS?:  Please feel free to call your physician or the hospital operator if you have any questions, and they will be happy to assist you.     [facility] Anesthesia Department [address] [place] [phone]

## 2011-09-08 DIAGNOSIS — Z1211 Encounter for screening for malignant neoplasm of colon: Secondary | ICD-10-CM

## 2011-09-09 ENCOUNTER — Encounter (HOSPITAL_COMMUNITY)
Admission: RE | Disposition: A | Discharge status: Home / Self Care | Payer: Self-pay | Source: Ambulatory Visit | Attending: Internal Medicine

## 2011-09-09 ENCOUNTER — Encounter (HOSPITAL_COMMUNITY): Payer: Self-pay | Admitting: Anesthesiology

## 2011-09-09 ENCOUNTER — Ambulatory Visit (HOSPITAL_COMMUNITY)
Admission: RE | Admit: 2011-09-09 | Discharge: 2011-09-09 | Disposition: A | Discharge status: Home / Self Care | Payer: BC Managed Care – PPO | Source: Ambulatory Visit | Attending: Internal Medicine | Admitting: Internal Medicine

## 2011-09-09 ENCOUNTER — Encounter (HOSPITAL_COMMUNITY): Payer: Self-pay | Admitting: *Deleted

## 2011-09-09 DIAGNOSIS — E785 Hyperlipidemia, unspecified: Secondary | ICD-10-CM | POA: Insufficient documentation

## 2011-09-09 DIAGNOSIS — Z1211 Encounter for screening for malignant neoplasm of colon: Secondary | ICD-10-CM | POA: Insufficient documentation

## 2011-09-09 SURGERY — COLONOSCOPY WITH PROPOFOL
Anesthesia: Monitor Anesthesia Care

## 2011-09-09 MED ORDER — FENTANYL CITRATE 0.05 MG/ML IJ SOLN
INTRAMUSCULAR | Status: DC | PRN
  Administered 2011-09-09 (×2): 50 ug via INTRAVENOUS

## 2011-09-09 MED ORDER — STERILE WATER FOR IRRIGATION IR SOLN
Status: DC | PRN
  Administered 2011-09-09: 12:00:00

## 2011-09-09 MED ORDER — ONDANSETRON HCL 4 MG/2ML IJ SOLN
4.0000 mg | Freq: Once | INTRAMUSCULAR | Status: AC
  Administered 2011-09-09: 4 mg via INTRAVENOUS

## 2011-09-09 MED ORDER — MIDAZOLAM HCL 2 MG/2ML IJ SOLN
1.0000 mg | INTRAMUSCULAR | Status: DC | PRN
  Administered 2011-09-09 (×2): 2 mg via INTRAVENOUS

## 2011-09-09 MED ORDER — DEXAMETHASONE SODIUM PHOSPHATE 4 MG/ML IJ SOLN
INTRAMUSCULAR | Status: AC
  Administered 2011-09-09: 4 mg via INTRAVENOUS
  Filled 2011-09-09: qty 1

## 2011-09-09 MED ORDER — WATER FOR IRRIGATION, STERILE IR SOLN
Status: DC | PRN
  Administered 2011-09-09: 1000 mL

## 2011-09-09 MED ORDER — FENTANYL CITRATE 0.05 MG/ML IJ SOLN
INTRAMUSCULAR | Status: AC
  Filled 2011-09-09: qty 2

## 2011-09-09 MED ORDER — MIDAZOLAM HCL 2 MG/2ML IJ SOLN
INTRAMUSCULAR | Status: AC
  Filled 2011-09-09: qty 2

## 2011-09-09 MED ORDER — ONDANSETRON HCL 4 MG/2ML IJ SOLN
INTRAMUSCULAR | Status: AC
  Administered 2011-09-09: 4 mg via INTRAVENOUS
  Filled 2011-09-09: qty 2

## 2011-09-09 MED ORDER — GLYCOPYRROLATE 0.2 MG/ML IJ SOLN
0.2000 mg | Freq: Once | INTRAMUSCULAR | Status: AC | PRN
  Administered 2011-09-09: 0.2 mg via INTRAVENOUS

## 2011-09-09 MED ORDER — PROPOFOL 10 MG/ML IV EMUL
INTRAVENOUS | Status: AC
  Filled 2011-09-09: qty 20

## 2011-09-09 MED ORDER — LACTATED RINGERS IV SOLN
INTRAVENOUS | Status: DC
  Administered 2011-09-09: 1000 mL via INTRAVENOUS

## 2011-09-09 MED ORDER — SCOPOLAMINE 1 MG/3DAYS TD PT72
1.0000 | MEDICATED_PATCH | Freq: Once | TRANSDERMAL | Status: DC
  Administered 2011-09-09: 1.5 mg via TRANSDERMAL

## 2011-09-09 MED ORDER — SCOPOLAMINE 1 MG/3DAYS TD PT72
MEDICATED_PATCH | TRANSDERMAL | Status: AC
  Administered 2011-09-09: 1.5 mg via TRANSDERMAL
  Filled 2011-09-09: qty 1

## 2011-09-09 MED ORDER — GLYCOPYRROLATE 0.2 MG/ML IJ SOLN
INTRAMUSCULAR | Status: AC
  Administered 2011-09-09: 0.2 mg via INTRAVENOUS
  Filled 2011-09-09: qty 1

## 2011-09-09 MED ORDER — PROPOFOL 10 MG/ML IV EMUL
INTRAVENOUS | Status: DC | PRN
  Administered 2011-09-09: 150 ug/kg/min via INTRAVENOUS

## 2011-09-09 MED ORDER — MIDAZOLAM HCL 2 MG/2ML IJ SOLN
INTRAMUSCULAR | Status: AC
  Administered 2011-09-09: 2 mg via INTRAVENOUS
  Filled 2011-09-09: qty 2

## 2011-09-09 MED ORDER — DEXAMETHASONE SODIUM PHOSPHATE 4 MG/ML IJ SOLN
4.0000 mg | Freq: Once | INTRAMUSCULAR | Status: AC
  Administered 2011-09-09: 4 mg via INTRAVENOUS

## 2011-09-09 MED ORDER — LIDOCAINE HCL (PF) 1 % IJ SOLN
INTRAMUSCULAR | Status: AC
  Filled 2011-09-09: qty 5

## 2011-09-09 MED ORDER — LIDOCAINE HCL (CARDIAC) 10 MG/ML IV SOLN
INTRAVENOUS | Status: DC | PRN
  Administered 2011-09-09: 10 mg via INTRAVENOUS

## 2011-09-09 SURGICAL SUPPLY — 21 items
ELECT REM PT RETURN 9FT ADLT (ELECTROSURGICAL)
ELECTRODE REM PT RTRN 9FT ADLT (ELECTROSURGICAL) IMPLANT
FCP BXJMBJMB 240X2.8X (CUTTING FORCEPS)
FLOOR PAD 36X40 (MISCELLANEOUS) ×2
FORCEPS BIOP RAD 4 LRG CAP 4 (CUTTING FORCEPS) IMPLANT
FORCEPS BIOP RJ4 240 W/NDL (CUTTING FORCEPS)
FORCEPS BXJMBJMB 240X2.8X (CUTTING FORCEPS) IMPLANT
INJECTOR/SNARE I SNARE (MISCELLANEOUS) IMPLANT
LUBRICANT JELLY 4.5OZ STERILE (MISCELLANEOUS) ×2 IMPLANT
MANIFOLD NEPTUNE II (INSTRUMENTS) ×2 IMPLANT
NEEDLE SCLEROTHERAPY 25GX240 (NEEDLE) IMPLANT
PAD FLOOR 36X40 (MISCELLANEOUS) ×1 IMPLANT
PROBE APC STR FIRE (PROBE) IMPLANT
PROBE INJECTION GOLD (MISCELLANEOUS)
PROBE INJECTION GOLD 7FR (MISCELLANEOUS) IMPLANT
SNARE ROTATE MED OVAL 20MM (MISCELLANEOUS) IMPLANT
SYR 50ML LL SCALE MARK (SYRINGE) ×2 IMPLANT
TRAP SPECIMEN MUCOUS 40CC (MISCELLANEOUS) IMPLANT
TUBING ENDO SMARTCAP PENTAX (MISCELLANEOUS) IMPLANT
TUBING IRRIGATION ENDOGATOR (MISCELLANEOUS) ×2 IMPLANT
WATER STERILE IRR 1000ML POUR (IV SOLUTION) ×4 IMPLANT

## 2011-09-09 NOTE — Op Note (Signed)
[facility] [address] [place], [place]  [address]  COLONOSCOPY PROCEDURE REPORT  PATIENT:  Erica Lynch, Erica Lynch  MR#:  [id number] BIRTHDATE:  [birth year], 50 yrs. old  GENDER:  female ENDOSCOPIST:  R. Roetta Sessions, MD FACP [facility] REF. BY:         Dr. Ouida Sills PROCEDURE DATE:  09/09/2011 PROCEDURE:   Screening colonoscopy  INDICATIONS:   first ever  average risk screening examination  INFORMED CONSENT:  The risks, benefits, alternatives and imponderables including but not limited to bleeding, perforation as well as the possibility of a missed lesion have been reviewed. The potential for biopsy, lesion removal, etc. have also been discussed.  Questions have been answered.  All parties agreeable. Please see the history and physical in the medical record for more information.  MEDICATIONS:   deep sedation per Dr. [facility]  DESCRIPTION OF PROCEDURE:  After a digital rectal exam was performed, the  colonoscope was advanced from the anus through the rectum and colon to the area of the cecum, ileocecal valve and appendiceal orifice.  The cecum was deeply intubated.  These structures were well-seen and photographed for the record.  From the level of the cecum and ileocecal valve, the scope was slowly and cautiously withdrawn.  The mucosal surfaces were carefully surveyed utilizing scope tip deflection to facilitate fold flattening as needed.  The scope was pulled down into the rectum where a thorough examination including retroflexion was performed. <<PROCEDUREIMAGES>>  FINDINGS: excellent preparation. Normal-appearing rectum and colon  THERAPEUTIC / DIAGNOSTIC MANEUVERS PERFORMED: none  COMPLICATIONS:   none  CECAL WITHDRAWAL TIME: 8 minutes  IMPRESSION:    Normal rectum and colon  RECOMMENDATIONS:    Consider repeat screening colonoscopy in 10 years  ______________________________ R. Roetta Sessions, MD Caleen Essex  CC:  Carylon Perches, MD  n. eSIGNED:   R.  Roetta Sessions at 09/09/2011 01:42 PM  Darene Lamer, [id number]

## 2011-09-09 NOTE — Anesthesia Procedure Notes (Addendum)
Procedure Name: MAC Date/Time: 09/09/2011 12:09 PM Performed by: Minerva Areola Pre-anesthesia Checklist: Patient identified, Patient being monitored, Emergency Drugs available, Timeout performed and Suction available Patient Re-evaluated:Patient Re-evaluated prior to inductionOxygen Delivery Method: Simple face mask

## 2011-09-09 NOTE — Transfer of Care (Signed)
Immediate Anesthesia Transfer of Care Note  Patient: Erica Lynch  Procedure(s) Performed:  COLONOSCOPY WITH PROPOFOL - in cecum at 1022, total withdrawal time  Patient Location: PACU  Anesthesia Type: MAC  Level of Consciousness: awake  Airway & Oxygen Therapy: Patient Spontanous Breathing.   Post-op Assessment: Report given to PACU RN, Post -op Vital signs reviewed and stable and Patient moving all extremities  Post vital signs: Reviewed and stable  Complications: No apparent anesthesia complications

## 2011-09-09 NOTE — H&P (Signed)
Primary Care Physician:  Carylon Perches, MD Primary Gastroenterologist:  Dr.   Pre-Procedure History & Physical: HPI:  Erica Lynch is a 50 y.o. female is here for a screening colonoscopy. First ever average risk screening examination. No lower GI tract symptoms. No family history of colon cancer.  Past Medical History  Diagnosis Date  . IBS (irritable bowel syndrome)   . Anxiety   . Hyperlipidemia   . PONV (postoperative nausea and vomiting)     Past Surgical History  Procedure Date  . Cesarean section 1996  . Other surgical history     Uterine ablation  . Endometrial ablation 2-3 yrs ago    [facility]  . Tubal ligation 6 yrs ago    [facility]    Prior to Admission medications   Medication Sig Start Date End Date Taking? Authorizing Provider  ALUVEA 39 % CREA Apply 1 application topically Nightly. 07/22/11  Yes Historical Provider, MD  buPROPion (WELLBUTRIN XL) 300 MG 24 hr tablet Take 300 mg by mouth daily.     Yes Historical Provider, MD  clonazePAM (KLONOPIN) 0.5 MG tablet Take 1 mg by mouth at bedtime as needed. For sleep   Yes Historical Provider, MD  FLUoxetine (PROZAC) 20 MG capsule Take 60 mg by mouth every morning.  06/29/11  Yes Historical Provider, MD    Allergies as of 08/10/2011  . (No Known Allergies)    Family History  Problem Relation Age of Onset  . Lung cancer Mother   . Breast cancer Mother   . Cirrhosis Father     etoh   . Anesthesia problems Neg Hx   . Hypotension Neg Hx   . Malignant hyperthermia Neg Hx   . Pseudochol deficiency Neg Hx     History   Social History  . Marital Status: Married    Spouse Name: N/A    Number of Children: 1  . Years of Education: N/A   Occupational History  . [employer]    Social History Main Topics  . Smoking status: Never Smoker   . Smokeless tobacco: Not on file  . Alcohol Use: Yes     glass of wine once a month sometime not at all  . Drug Use: No  . Sexually Active: Yes    Birth Control/  Protection: Surgical   Other Topics Concern  . Not on file   Social History Narrative   1 son-16    Review of Systems: See HPI, otherwise negative ROS  Physical Exam: BP 112/75  Pulse 76  Temp(Src) 98.4 F (36.9 C) (Oral)  Resp 14  Ht 5\' 5"  (1.651 m)  Wt 173 lb (78.472 kg)  BMI 28.79 kg/m2  SpO2 92% General:   Alert,  Well-developed, well-nourished, pleasant and cooperative in NAD Head:  Normocephalic and atraumatic. Eyes:  Sclera clear, no icterus.   Conjunctiva pink. Ears:  Normal auditory acuity. Nose:  No deformity, discharge,  or lesions. Mouth:  No deformity or lesions, dentition normal. Neck:  Supple; no masses or thyromegaly. Lungs:  Clear throughout to auscultation.   No wheezes, crackles, or rhonchi. No acute distress. Heart:  Regular rate and rhythm; no murmurs, clicks, rubs,  or gallops. Abdomen:  Soft, nontender and nondistended. No masses, hepatosplenomegaly or hernias noted. Normal bowel sounds, without guarding, and without rebound.   Msk:  Symmetrical without gross deformities. Normal posture. Pulses:  Normal pulses noted. Extremities:  Without clubbing or edema. Neurologic:  Alert and  oriented x4;  grossly normal neurologically. Skin:  Intact without significant lesions or rashes. Cervical Nodes:  No significant cervical adenopathy. Psych:  Alert and cooperative. Normal mood and affect.  Impression/Plan: Erica Lynch is now here to undergo a screening colonoscopy. First ever average risk screening examination  The risks, benefits, limitations, imponderables and alternatives regarding colonoscopy have been reviewed with the patient. Questions have been answered. All parties agreeable.

## 2011-09-09 NOTE — Anesthesia Preprocedure Evaluation (Signed)
Anesthesia Evaluation  Patient identified by MRN, date of birth, ID band Patient awake    Reviewed: Allergy & Precautions, H&P , NPO status , Patient's Chart, lab work & pertinent test results  History of Anesthesia Complications (+) PONV  Airway Mallampati: I      Dental  (+) Teeth Intact   Pulmonary neg pulmonary ROS,    Pulmonary exam normal       Cardiovascular neg cardio ROS Regular Normal    Neuro/Psych PSYCHIATRIC DISORDERS Anxiety Depression    GI/Hepatic GERD-  Medicated and Controlled,  Endo/Other    Renal/GU      Musculoskeletal   Abdominal   Peds  Hematology   Anesthesia Other Findings   Reproductive/Obstetrics                           Anesthesia Physical Anesthesia Plan  ASA: II  Anesthesia Plan: MAC   Post-op Pain Management:    Induction: Intravenous  Airway Management Planned: Simple Face Mask  Additional Equipment:   Intra-op Plan:   Post-operative Plan:   Informed Consent: I have reviewed the patients History and Physical, chart, labs and discussed the procedure including the risks, benefits and alternatives for the proposed anesthesia with the patient or authorized representative who has indicated his/her understanding and acceptance.     Plan Discussed with:   Anesthesia Plan Comments:         Anesthesia Quick Evaluation

## 2011-09-09 NOTE — Anesthesia Postprocedure Evaluation (Signed)
Anesthesia Post Note  Patient: Erica Lynch  Procedure(s) Performed:  COLONOSCOPY WITH PROPOFOL - in cecum at 1022, total withdrawal time  Anesthesia type: MAC  Patient location: PACU  Post pain: Pain level controlled  Post assessment: Post-op Vital signs reviewed, Patient's Cardiovascular Status Stable, Respiratory Function Stable, Patent Airway, No signs of Nausea or vomiting and Pain level controlled  Last Vitals:  Filed Vitals:   09/09/11 1234  BP: 103/75  Pulse: 94  Temp: 36.6 C  Resp: 18    Post vital signs: Reviewed and stable  Level of consciousness: awake and alert   Complications: No apparent anesthesia complications

## 2011-09-13 NOTE — Progress Notes (Signed)
Cc to PCP 

## 2011-12-01 ENCOUNTER — Other Ambulatory Visit (HOSPITAL_COMMUNITY)
Admission: RE | Admit: 2011-12-01 | Discharge: 2011-12-01 | Disposition: A | Discharge status: Home / Self Care | Payer: BC Managed Care – PPO | Source: Ambulatory Visit | Attending: Obstetrics and Gynecology | Admitting: Obstetrics and Gynecology

## 2011-12-01 ENCOUNTER — Other Ambulatory Visit: Payer: Self-pay | Admitting: Obstetrics and Gynecology

## 2011-12-01 DIAGNOSIS — Z01419 Encounter for gynecological examination (general) (routine) without abnormal findings: Secondary | ICD-10-CM | POA: Insufficient documentation

## 2011-12-21 ENCOUNTER — Other Ambulatory Visit: Payer: Self-pay | Admitting: Obstetrics and Gynecology

## 2011-12-31 ENCOUNTER — Other Ambulatory Visit: Payer: Self-pay | Admitting: Obstetrics and Gynecology

## 2011-12-31 DIAGNOSIS — Z139 Encounter for screening, unspecified: Secondary | ICD-10-CM

## 2012-02-10 ENCOUNTER — Ambulatory Visit (HOSPITAL_COMMUNITY)
Admission: RE | Admit: 2012-02-10 | Discharge: 2012-02-10 | Disposition: A | Discharge status: Home / Self Care | Payer: BC Managed Care – PPO | Source: Ambulatory Visit | Attending: Obstetrics and Gynecology | Admitting: Obstetrics and Gynecology

## 2012-02-10 DIAGNOSIS — Z1231 Encounter for screening mammogram for malignant neoplasm of breast: Secondary | ICD-10-CM | POA: Insufficient documentation

## 2012-02-10 DIAGNOSIS — Z139 Encounter for screening, unspecified: Secondary | ICD-10-CM

## 2012-03-02 ENCOUNTER — Ambulatory Visit: Payer: BC Managed Care – PPO | Admitting: Orthopedic Surgery

## 2012-05-24 ENCOUNTER — Other Ambulatory Visit: Payer: Self-pay | Admitting: Obstetrics and Gynecology

## 2012-05-24 ENCOUNTER — Other Ambulatory Visit (HOSPITAL_COMMUNITY)
Admission: RE | Admit: 2012-05-24 | Discharge: 2012-05-24 | Disposition: A | Discharge status: Home / Self Care | Payer: BC Managed Care – PPO | Source: Ambulatory Visit | Attending: Obstetrics and Gynecology | Admitting: Obstetrics and Gynecology

## 2012-05-24 DIAGNOSIS — Z01419 Encounter for gynecological examination (general) (routine) without abnormal findings: Secondary | ICD-10-CM | POA: Insufficient documentation

## 2012-06-21 ENCOUNTER — Other Ambulatory Visit: Payer: Self-pay | Admitting: Physician Assistant

## 2012-11-15 ENCOUNTER — Other Ambulatory Visit (HOSPITAL_COMMUNITY)
Admission: RE | Admit: 2012-11-15 | Discharge: 2012-11-15 | Disposition: A | Discharge status: Home / Self Care | Payer: No Typology Code available for payment source | Source: Ambulatory Visit | Attending: Obstetrics and Gynecology | Admitting: Obstetrics and Gynecology

## 2012-11-15 ENCOUNTER — Other Ambulatory Visit: Payer: Self-pay | Admitting: Obstetrics and Gynecology

## 2012-11-15 ENCOUNTER — Other Ambulatory Visit (HOSPITAL_COMMUNITY)
Admission: RE | Admit: 2012-11-15 | Discharge: 2012-11-15 | Disposition: A | Discharge status: Home / Self Care | Payer: BC Managed Care – PPO | Source: Ambulatory Visit | Attending: Obstetrics and Gynecology | Admitting: Obstetrics and Gynecology

## 2012-11-15 DIAGNOSIS — Z01419 Encounter for gynecological examination (general) (routine) without abnormal findings: Secondary | ICD-10-CM | POA: Insufficient documentation

## 2012-11-15 DIAGNOSIS — Z1151 Encounter for screening for human papillomavirus (HPV): Secondary | ICD-10-CM | POA: Insufficient documentation

## 2012-12-27 ENCOUNTER — Ambulatory Visit (INDEPENDENT_AMBULATORY_CARE_PROVIDER_SITE_OTHER): Payer: BC Managed Care – PPO | Admitting: Psychiatry

## 2012-12-27 DIAGNOSIS — F329 Major depressive disorder, single episode, unspecified: Secondary | ICD-10-CM

## 2012-12-27 DIAGNOSIS — F3289 Other specified depressive episodes: Secondary | ICD-10-CM

## 2012-12-29 NOTE — Patient Instructions (Signed)
Discussed orally 

## 2012-12-29 NOTE — Progress Notes (Addendum)
Patient:  Erica Lynch   DOB: 08/11/1961  MR Number: [id number]  [facility]:  [address] [place] [place],  [place], [address]  Start: Wednesday 12/27/2012 2:00 PM End: Wednesday 12/27/2012 2:55 PM  Provider/Observer:     Florencia Reasons, MSW, LCSW   Chief Complaint:      Chief Complaint  Patient presents with  . Anxiety  . Depression    Reason For Service:     The patient is referred for services by primary care physician Dr. Ouida Sills do to patient experiencing increased symptoms of depression and anxiety. She is a returning patient was last seen in this practice on 01/05/2011. 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 Patient reports being angry, irritable, resentful, and sad. She also expresses guilt for her feelings. She reports feeling overwhelmed, being more negative, and isolating self. She reports spending most of her Saturdays cleaning her home and being becoming very angry and resentful as her husband and son do not appreciate her efforts and did not pick up after themselves.  Interventions Strategy:  Supportive therapy  Participation Level:   Active  Participation Quality:  Appropriate      Behavioral Observation:  Well Groomed, Alert, and Tearful.   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 Patient admits fleeting suicidal ideations about 3 weeks ago and states she would not harm self. She denies current suicidal ideations as well as homicidal ideations. She denies any  psychotic symptoms. The patient agrees to call this practice, call 911, or have someone take her to the emergency room should symptoms worsen.  Patient Progress:   Poor. The patient reports experiencing increased symptoms of anxiety and depression prior to her father's death as he continued to experience problems with alcohol abuse/dependence and was in and out of the hospital. Symptoms have worsened since his death. Patient reports other stressors as being in her marriage as well as her job. Patient has continued to attending work regularly but reports feeling overwhelmed. She has been more negative and has lost interest in activities. Therapist works with patient to identify ways to improve self-care and to review relaxation techniques as well as began to process some of her feelings  Target Goals:   Establishing rapport  Last Reviewed:     Goals Addressed Today:    Establishing rapport  Impression/Diagnosis:   The patient presents with a long-standing history of depression that has been well controlled most of the time but has worsened in recent months due to to her father's death in 10-13-13marital discord, and work related stress. The patient is experiencing depressed mood, irritability, anxiety, excessive worrying, isolative behaviors, crying spells, fatigue, and loss of interest in activities. Diagnosis depressive disorder, rule out OCD  Diagnosis:  Axis I: Depressive disorder, not elsewhere classified          Axis II: Deferred

## 2013-01-03 ENCOUNTER — Encounter: Payer: Self-pay | Admitting: Obstetrics and Gynecology

## 2013-01-04 ENCOUNTER — Ambulatory Visit (INDEPENDENT_AMBULATORY_CARE_PROVIDER_SITE_OTHER): Payer: BC Managed Care – PPO | Admitting: Psychiatry

## 2013-01-04 DIAGNOSIS — F329 Major depressive disorder, single episode, unspecified: Secondary | ICD-10-CM

## 2013-01-04 NOTE — Progress Notes (Signed)
Patient:  Erica Lynch   DOB: 1961/05/31  MR Number: [id number]  Location: [facility]:  [address] [place] [place],  [place], [address]  Start: Wednesday 12/27/2012 2:00 PM End: Wednesday 12/27/2012 2:55 PM  Provider/Observer:     Florencia Reasons, MSW, LCSW   Chief Complaint:      Chief Complaint  Patient presents with  . Depression  . Anxiety    Reason For Service:     The patient is referred for services by primary care physician Dr. Ouida Sills do to patient experiencing increased symptoms of depression and anxiety. She is a returning patient was last seen in this practice on 01/05/2011. 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 Patient reports being angry, irritable, resentful, and sad. She also expresses guilt for her feelings. She reports feeling overwhelmed, being more negative, and isolating self. She reports spending most of her Saturdays cleaning her home and being becoming very angry and resentful as her husband and son do not appreciate her efforts and did not pick up after themselves. Patient is seen today for follow up appointment.  Interventions Strategy:  Supportive therapy  Participation Level:   Active  Participation Quality:  Appropriate      Behavioral Observation:  Well Groomed, Alert, and Tearful.   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  Patient Progress:   Fair. The patient reports feeling better since last session. She reports increased involvement in activity going toher  son's ballgame and attending church. She reports decreased conflict and increased positive interaction with husband who expressed support regarding patient being in treatment. 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 Patient has improved self-care efforts regarding nutrition and taking time for self. Patient continues to have unresolved grief and loss issues regarding her father's death. She also continues to experience anger issues regarding the past and present and states no longer wanting to be consumed by her anger. She reports additional stress related to anxiety and worry about husband and son not meeting her expectations. Patient also is experiencing anxiety related to her father's estate. She plans to attend a meeting tomorrow and is concerned about possible conflict with her sister. Therapist works with patient to develop a treatment plan. Patient Patient is looking forward to going on a weekend trip to [place] with her friend.   Target Goals:   1. Decrease anxiety and excessive worry: 1:1 psychotherapy one time every one to 4 weeks (supportive, cognitive behavioral therapy)    2. Process anger and manage in a healthy way: 1:1 psychotherapy one time every one to 4 weeks (supportive, cognitive behavioral therapy)    3. Increase positive interaction at work: 1:1 psychotherapy one time every one to 4 weeks (supportive cognitive behavioral therapy)    4. Process and resolve grief and loss issues, obtain closure: 1:1 psychotherapy one time every one to 4 weeks (supportive, cognitive behavioral therapy)  Last Reviewed:   01/04/2013  Goals Addressed Today:    Goals 1, 2, 3, and 4  Impression/Diagnosis:  The patient presents with a long-standing history of depression that has been well controlled most of the time but has worsened in recent months due to to her father's death in Nov 07, 2013marital discord, and work related stress. The patient  is experiencing depressed mood, irritability, anxiety, excessive worrying, isolative behaviors, crying spells, fatigue, and loss of interest in activities. Diagnosis depressive disorder, rule out OCD  Diagnosis:  Axis I: Depressive disorder, not elsewhere classified          Axis II: Deferred

## 2013-01-09 ENCOUNTER — Encounter: Payer: Self-pay | Admitting: Obstetrics and Gynecology

## 2013-01-18 ENCOUNTER — Ambulatory Visit (INDEPENDENT_AMBULATORY_CARE_PROVIDER_SITE_OTHER): Payer: BC Managed Care – PPO | Admitting: Psychiatry

## 2013-01-18 DIAGNOSIS — F329 Major depressive disorder, single episode, unspecified: Secondary | ICD-10-CM

## 2013-01-19 ENCOUNTER — Other Ambulatory Visit: Payer: Self-pay | Admitting: Obstetrics and Gynecology

## 2013-01-19 DIAGNOSIS — Z139 Encounter for screening, unspecified: Secondary | ICD-10-CM

## 2013-01-19 NOTE — Progress Notes (Addendum)
Patient:  Erica Lynch   DOB: [birth year]  MR Number: [id number]  [facility]: [facility]:  [address] [place],  [place], [address]  Start: Thursday 01/18/2013 3:00 PM End: Thursday 01/18/2013 3:50 PM  Provider/Observer:     Florencia Reasons, MSW, LCSW   Chief Complaint:      Chief Complaint  Patient presents with  . Depression  . Anxiety    Reason For Service:     The patient is referred for services by primary care physician Dr. Ouida Sills do to patient experiencing increased symptoms of depression and anxiety. She is a returning patient was last seen in this practice on 01/05/2011. 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 Patient reports being angry, irritable, resentful, and sad. She also expresses guilt for her feelings. She reports feeling overwhelmed, being more negative, and isolating self. She reports spending most of her Saturdays cleaning her home and being becoming very angry and resentful as her husband and son do not appreciate her efforts and did not pick up after themselves. Patient is seen today for follow up appointment.  Interventions Strategy:  Supportive therapy  Participation Level:   Active  Participation Quality:  Appropriate      Behavioral Observation:  Well Groomed, Alert, and Tearful at times  Current Psychosocial Factors: Father's death in 12-Jul-2012 stress related to work demands  Content of Session:    reviewing symptoms, processing grief and loss issues, reinforcing efforts to improve self-care, identifying boundary issues in work relationships  Current Status:   The patient reports improved mood and resuming normal interest in activities. However, she reports increased work stress and anger.  Patient Progress:   Fair. The patient reports feeling less depressed and increased involvement in activities including  regularly attending her son's baseball games. She has increased self-care efforts.  She continues to experience grief and loss issues regarding her father. She reports recently going inside her father's home and having difficulty being in the home due to to experiencing deep sadness. Therapist works with patient to process her feelings. Patient is grieving not only over the loss of her father but also for the way he lived his life after patient's mothers death. Patient expresses sadness regarding her father's alcohol addiction and his loneliness. Patient reports increased work stress related to additional work duties due to being [employer]. She expresses anger and frustration with her brother-in-law who is one of the co-owners of the business and will not confront issues or employees to try to correct the situation per patient's report. Patient reports repeated attempts to try to talk to her brother-in-law and a tendency to run interference to try to make things easier for her brother-in-law. Therapist works with patient to process her feelings and to discuss boundary issues in her work relationships. Therapist also works with patient to identify relaxation techniques.  Target Goals:   1. Decrease anxiety and excessive worry: 1:1 psychotherapy one time every one to 4 weeks (supportive, cognitive behavioral therapy)    2. Process anger and manage in a healthy way: 1:1 psychotherapy one time every one to 4 weeks (supportive, cognitive behavioral therapy)    3. Increase positive interaction at work: 1:1 psychotherapy one time every one to 4 weeks (supportive cognitive behavioral therapy)    4. Process and resolve grief and loss issues, obtain closure: 1:1 psychotherapy one time every one to  4 weeks (supportive, cognitive behavioral therapy)  Last Reviewed:   01/04/2013  Goals Addressed Today:    Goals 1, 2, 3, and 4  Impression/Diagnosis:   The patient presents with a long-standing history of depression  that has been well controlled most of the time but has worsened in recent months due to to her father's death in Oct 20, 2013marital discord, and work related stress. The patient is experiencing depressed mood, irritability, anxiety, excessive worrying, isolative behaviors, crying spells, fatigue, and loss of interest in activities. Diagnosis depressive disorder, rule out OCD  Diagnosis:  Axis I: Depressive disorder, not elsewhere classified          Axis II: Deferred

## 2013-01-19 NOTE — Patient Instructions (Signed)
Discussed orally 

## 2013-02-01 ENCOUNTER — Ambulatory Visit (HOSPITAL_COMMUNITY): Payer: Self-pay | Admitting: Psychiatry

## 2013-02-09 ENCOUNTER — Ambulatory Visit (INDEPENDENT_AMBULATORY_CARE_PROVIDER_SITE_OTHER): Payer: BC Managed Care – PPO | Admitting: Psychiatry

## 2013-02-09 DIAGNOSIS — F329 Major depressive disorder, single episode, unspecified: Secondary | ICD-10-CM

## 2013-02-11 NOTE — Patient Instructions (Signed)
Discussed orally 

## 2013-02-11 NOTE — Progress Notes (Signed)
Patient:  Erica Lynch   DOB: 08-30-1961  MR Number: [id number]  Location: [facility]:  [address] [place],  [place], [address]  Start: Friday 02/09/2013 2:00 PM End: Friday 02/09/2013 2:50 PM  Provider/Observer:     Erica Lynch, MSW, LCSW   Chief Complaint:      Chief Complaint  Patient presents with  . Anxiety  . Depression    Reason For Service:     The patient is referred for services by primary care physician Dr. Ouida Sills do to patient experiencing increased symptoms of depression and anxiety. She is a returning patient was last seen in this practice on 01/05/2011. 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 Patient reports being angry, irritable, resentful, and sad. She also expresses guilt for her feelings. She reports feeling overwhelmed, being more negative, and isolating self. She reports spending most of her Saturdays cleaning her home and being becoming very angry and resentful as her husband and son do not appreciate her efforts and did not pick up after themselves. Patient is seen today for follow up appointment.  Interventions Strategy:  Supportive therapy  Participation Level:   Active  Participation Quality:  Appropriate      Behavioral Observation:  Well Groomed, Alert, and appropriate  Current Psychosocial Factors: Father's death in 2013-10-31father's birthday was Sunday, stress related to work issues  Content of Session:    reviewing symptoms, processing grief and loss issues, identifying areas within patient's control, identifying coping statements  Current Status:   The patient reports "okay"  mood and resuming normal interest in activities. She reports increased sadness triggered by Mother's Day and her father's birthday.  Patient Progress:   Fair. The patient reports feeling okay but experiencing increased  sadness this past Sunday  due to Mother's Day and her father's birthday. Therapist works with patient to process her feelings. Patient states using it at work recently due to one of the co-owners taking supplies without going through proper procedures and paying. She told her brother-in-law who addressed the issues. 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 Therapist works with patient to process her feelings and to identify areas within her control as well as identify coping statements.   Target Goals:   1. Decrease anxiety and excessive worry: 1:1 psychotherapy one time every one to 4 weeks (supportive, cognitive behavioral therapy)    2. Process anger and manage in a healthy way: 1:1 psychotherapy one time every one to 4 weeks (supportive, cognitive behavioral therapy)    3. Increase positive interaction at work: 1:1 psychotherapy one time every one to 4 weeks (supportive cognitive behavioral therapy)    4. Process and resolve grief and loss issues, obtain closure: 1:1 psychotherapy one time every one to 4 weeks (supportive, cognitive behavioral therapy)  Last Reviewed:   01/04/2013  Goals Addressed Today:    Goals 1, 2, and 4  Impression/Diagnosis:   The patient presents with a long-standing history of depression that has been well controlled most of the time but has worsened in recent months due to to her father's death in 10/31/13marital discord, and work related stress. The patient is experiencing depressed mood, irritability, anxiety, excessive worrying, isolative behaviors, crying  spells, fatigue, and loss of interest in activities. Diagnosis depressive disorder, rule out OCD  Diagnosis:  Axis I: Depressive disorder, not elsewhere classified          Axis II: Deferred

## 2013-02-12 ENCOUNTER — Ambulatory Visit (HOSPITAL_COMMUNITY)
Admission: RE | Admit: 2013-02-12 | Discharge: 2013-02-12 | Disposition: A | Discharge status: Home / Self Care | Payer: BC Managed Care – PPO | Source: Ambulatory Visit | Attending: Obstetrics and Gynecology | Admitting: Obstetrics and Gynecology

## 2013-02-12 DIAGNOSIS — Z139 Encounter for screening, unspecified: Secondary | ICD-10-CM

## 2013-02-12 DIAGNOSIS — Z1231 Encounter for screening mammogram for malignant neoplasm of breast: Secondary | ICD-10-CM | POA: Insufficient documentation

## 2013-02-27 ENCOUNTER — Ambulatory Visit (HOSPITAL_COMMUNITY): Payer: Self-pay | Admitting: Psychiatry

## 2013-03-05 ENCOUNTER — Ambulatory Visit (INDEPENDENT_AMBULATORY_CARE_PROVIDER_SITE_OTHER): Payer: BC Managed Care – PPO | Admitting: Psychiatry

## 2013-03-05 DIAGNOSIS — F329 Major depressive disorder, single episode, unspecified: Secondary | ICD-10-CM

## 2013-03-05 NOTE — Progress Notes (Signed)
Patient:  Erica Lynch   DOB: Aug 26, 1961  MR Number: [id number]  Location: [facility]:  [address] [place],  [place], [address]  Start: Monday 03/05/2013 9:00 AM End: Monday 03/05/2013 9:55 AM  Provider/Observer:     Florencia Reasons, MSW, LCSW   Chief Complaint:      Chief Complaint  Patient presents with  . Depression  . Anxiety    Reason For Service:     The patient is referred for services by primary care physician Dr. Ouida Sills do to patient experiencing increased symptoms of depression and anxiety. She is a returning patient was last seen in this practice on 01/05/2011. 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 Patient reports being angry, irritable, resentful, and sad. She also expresses guilt for her feelings. She reports feeling overwhelmed, being more negative, and isolating self. She reports spending most of her Saturdays cleaning her home and being becoming very angry and resentful as her husband and son do not appreciate her efforts and did not pick up after themselves. Patient is seen today for follow up appointment.  Interventions Strategy:  Supportive therapy, cognitive behavioral therapy  Participation Level:   Active  Participation Quality:  Appropriate      Behavioral Observation:  Well Groomed, Alert, and appropriate  Current Psychosocial Factors: Patient reports recent conflict with her sister  Content of Session:    reviewing symptoms, processing feelings, discussing boundary issues and ways to maintain boundaries,  identifying areas within patient's control  Current Status:   The patient reports less depressed mood but increased anger and fatigue.  Patient Progress:   Good. The patient reports recent conflict with her sister regarding financial matters related to patient's brother-in-law's business. She expresses anger and  disappointment regarding her sister's refusal to accept brother-in-law's recent offer regarding the business. Patient expresses worry about brother-in-law's health and excessive use of alcohol. This has triggered memories of patient's father's excessive alcohol use. Therapist works with patient to identify the effects of this on patient's relationship with her brother-in-law and her ability to set boundaries. Therapist also works with patient to identify areas within her control. Therapist encourages patient to resume self-care efforts regarding walking and nutrition. Patient also plans to followup with her doctor regarding increased fatigue. Patient is excited about her son graduated from high school this Saturday.    Target Goals:   1. Decrease anxiety and excessive worry: 1:1 psychotherapy one time every one to 4 weeks (supportive, cognitive behavioral therapy)    2. Process anger and manage in a healthy way: 1:1 psychotherapy one time every one to 4 weeks (supportive, cognitive behavioral therapy)    3. Increase positive interaction at work: 1:1 psychotherapy one time every one to 4 weeks (supportive cognitive behavioral therapy)    4. Process and resolve grief and loss issues, obtain closure: 1:1 psychotherapy one time every one to 4 weeks (supportive, cognitive behavioral therapy)  Last Reviewed:   01/04/2013  Goals Addressed Today:    Goals 1, 2,  Impression/Diagnosis:   The patient presents with a long-standing history of depression that has been well controlled most of the time but has worsened in recent months due to to her father's death in 11-08-13marital discord, and work related stress. The patient is experiencing depressed mood, irritability, anxiety, excessive worrying, isolative behaviors, crying spells, fatigue, and loss of interest in activities.  Diagnosis depressive disorder, rule out OCD  Diagnosis:  Axis I: Depressive disorder, not elsewhere classified          Axis II:  Deferred

## 2013-03-05 NOTE — Patient Instructions (Signed)
Discussed orally 

## 2013-03-28 ENCOUNTER — Ambulatory Visit (HOSPITAL_COMMUNITY): Payer: Self-pay | Admitting: Psychiatry

## 2013-04-09 ENCOUNTER — Ambulatory Visit (INDEPENDENT_AMBULATORY_CARE_PROVIDER_SITE_OTHER): Payer: BC Managed Care – PPO | Admitting: Psychiatry

## 2013-04-09 DIAGNOSIS — F3289 Other specified depressive episodes: Secondary | ICD-10-CM

## 2013-04-09 DIAGNOSIS — F329 Major depressive disorder, single episode, unspecified: Secondary | ICD-10-CM

## 2013-04-10 NOTE — Progress Notes (Signed)
Patient:  Erica Lynch   DOB: 1961-03-11  MR Number: [id number]  Location: [facility]:  [address] [place],  [place], [address]  Start: Monday 04/09/2013 9:05 AM End: Monday 04/09/2013 9:55 AM  Provider/Observer:     Florencia Reasons, MSW, LCSW   Chief Complaint:      Chief Complaint  Patient presents with  . Anxiety  . Depression    Reason For Service:     The patient is referred for services by primary care physician Dr. Ouida Sills do to patient experiencing increased symptoms of depression and anxiety. She is a returning patient was last seen in this practice on 01/05/2011. 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 Patient reports being angry, irritable, resentful, and sad. She also expresses guilt for her feelings. She reports feeling overwhelmed, being more negative, and isolating self. She reports spending most of her Saturdays cleaning her home and becoming very angry and resentful as her husband and son do not appreciate her efforts and did not pick up after themselves. Patient is seen today for follow up appointment.  Interventions Strategy:  Supportive therapy, cognitive behavioral therapy  Participation Level:   Active  Participation Quality:  Appropriate      Behavioral Observation:  Well Groomed, Alert, and appropriate  Current Psychosocial Factors: Patient reports son will be going to college this fall and is scheduled for orientation this Friday.  Content of Session:    reviewing symptoms, processing feelings, discussing boundary issues and ways to maintain boundaries,  identifying areas within patient's control  Current Status:   The patient reports less depressed mood but increased sadness and worry. She also continues to experience anger.  Patient Progress:   Good. The patient reports decreased stress regarding work as her  brother-in-law's partner has retired. Patient is pleased. Patient is experiencing increased anxiety regarding her son who is planning to attending college this fall. He is scheduled for orientation this Friday but has not completed some of his paperwork for school. Patient is experiencing anxiety as she is concerned about her son's lack of planning and organization. She also is experiencing anxiety and transition issues regarding her son leaving home. Therapist works with patient to process feelings, to identify coping statements, and identify personal interests patient possibly could pursue at this time. Patient expresses interest in taking accounting classes.    Target Goals:   1. Decrease anxiety and excessive worry: 1:1 psychotherapy one time every one to 4 weeks (supportive, cognitive behavioral therapy)    2. Process anger and manage in a healthy way: 1:1 psychotherapy one time every one to 4 weeks (supportive, cognitive behavioral therapy)    3. Increase positive interaction at work: 1:1 psychotherapy one time every one to 4 weeks (supportive cognitive behavioral therapy)    4. Process and resolve grief and loss issues, obtain closure: 1:1 psychotherapy one time every one to 4 weeks (supportive, cognitive behavioral therapy)  Last Reviewed:   01/04/2013  Goals Addressed Today:    Goal 1  Impression/Diagnosis:   The patient presents with a long-standing history of depression that has been well controlled most of the time but has worsened in recent months due to to her father's death in 2013/11/09marital discord, and work related stress. The patient is experiencing depressed mood, irritability, anxiety, excessive worrying, isolative behaviors, crying spells, fatigue, and loss of interest in activities.  Diagnosis depressive disorder, rule out OCD  Diagnosis:  Axis I: Depressive disorder, not elsewhere classified          Axis II: Deferred

## 2013-04-10 NOTE — Patient Instructions (Signed)
Discussed orally 

## 2013-04-23 ENCOUNTER — Ambulatory Visit (HOSPITAL_COMMUNITY): Payer: Self-pay | Admitting: Psychiatry

## 2013-05-30 ENCOUNTER — Other Ambulatory Visit: Payer: Self-pay | Admitting: Physician Assistant

## 2013-11-07 ENCOUNTER — Encounter (INDEPENDENT_AMBULATORY_CARE_PROVIDER_SITE_OTHER): Payer: Self-pay

## 2013-11-07 ENCOUNTER — Other Ambulatory Visit: Payer: Self-pay | Admitting: Adult Health

## 2013-11-07 ENCOUNTER — Ambulatory Visit (INDEPENDENT_AMBULATORY_CARE_PROVIDER_SITE_OTHER): Payer: BC Managed Care – PPO | Admitting: Adult Health

## 2013-11-07 ENCOUNTER — Encounter: Payer: Self-pay | Admitting: Adult Health

## 2013-11-07 ENCOUNTER — Other Ambulatory Visit: Payer: Self-pay | Admitting: Obstetrics and Gynecology

## 2013-11-07 VITALS — BP 122/80 | Ht 64.0 in | Wt 196.5 lb

## 2013-11-07 DIAGNOSIS — N6320 Unspecified lump in the left breast, unspecified quadrant: Secondary | ICD-10-CM

## 2013-11-07 DIAGNOSIS — N644 Mastodynia: Secondary | ICD-10-CM

## 2013-11-07 DIAGNOSIS — N63 Unspecified lump in unspecified breast: Secondary | ICD-10-CM

## 2013-11-07 DIAGNOSIS — N6325 Unspecified lump in the left breast, overlapping quadrants: Secondary | ICD-10-CM

## 2013-11-07 HISTORY — DX: Unspecified lump in the left breast, unspecified quadrant: N63.20

## 2013-11-07 NOTE — Progress Notes (Signed)
Subjective:     Patient ID: Erica Lynch, female   DOB: [birth year], 53 y.o.   MRN: [id number]  HPI Erica Lynch is a 53 year old white female, married, in complaining of sore spot left breast and it feels different, noticed today.And she is teary, Mom had breast cancer.  Review of Systems See HPI Reviewed past medical,surgical, social and family history. Reviewed medications and allergies.     Objective:   Physical Exam BP 122/80  Ht 5\' 4"  (1.626 m)  Wt 196 lb 8 oz (89.132 kg)  BMI 33.71 kg/m2  LMP 10/15/2013    Skin warm and dry,  Breasts:no dominate palpable mass, retraction or nipple discharge on the right, no retraction or nipple discharge on the left, but has thickness ?mass at 3 0'clock at areola about 2 x 3 cms, that is tender.Had normal mammogram 12/2012. Discussed that tenderness is good sign could be a cyst, but will get diagnostic mammogram and [place] to evaluate the area.  Assessment:     Left breast mass    Plan:     Diagnostic mammogram 2/17 at 1:30 pm at [facility], and [place] if needed Follow up prn , has physical scheduled 2/23 with Dr Glo Herring   Do not wear under wire this week Review handout on breast cyst

## 2013-11-07 NOTE — Patient Instructions (Signed)
Breast Cyst  A breast cyst is a sac in the breast that is filled with fluid. Breast cysts are common in women. Women can have one or many cysts. When the breasts contain many cysts, it is usually due to a noncancerous (benign) condition called fibrocystic change. These lumps form under the influence of female hormones (estrogen and progesterone). The lumps are most often located in the upper, outer portion of the breast. They are often more swollen, painful, and tender before your period starts. They usually disappear after menopause, unless you are on hormone therapy.   There are several types of cysts:  · Macrocyst. This is a cyst that is about 2 in. (5.1 cm) in diameter.    · Microcyst. This is a tiny cyst that you cannot feel but can be seen with a mammogram or an ultrasound.    · Galactocele. This is a cyst containing milk that may develop if you suddenly stop breastfeeding.    · Sebaceous cyst of the skin. This type of cyst is not in the breast tissue itself.  Breast cysts do not increase your risk of breast cancer. However, they must be monitored closely because they can be cancerous.   CAUSES   It is not known exactly what causes a breast cyst to form. Possible causes include:   · An overgrowth of milk glands and connective tissue in the breast can block the milk glands, causing them to fill with fluid.    · Scar tissue in the breast from previous surgery may block the glands, causing a cyst.    RISK FACTORS  Estrogen may influence the development of a breast cyst.    SIGNS AND SYMPTOMS   · Feeling a smooth, round, soft lump (like a grape) in the breast that is easily moveable.    · Breast discomfort or pain.  · Increase in size of the lump before your menstrual period and decrease in its size after your menstrual period.    DIAGNOSIS   A cyst can be felt during a physical exam by your health care provider. A breast X-ray exam (mammogram) and ultrasonography will be done to confirm the diagnosis. Fluid may  be removed from the cyst with a needle (fine needle aspiration) to make sure the cyst is not cancerous.    TREATMENT   Treatment may not be necessary. Your health care provider may monitor the cyst to see if it goes away on its own. If treatment is needed, it may include:  · Hormone treatment.    · Needle aspiration. There is a chance of the cyst coming back after aspiration.    · Surgery to remove the whole cyst.    HOME CARE INSTRUCTIONS   · Keep all follow-up appointments with your health care provider.  · See your health care provider regularly:  · Get a yearly exam by your health care provider.  · Have a clinical breast exam by a health care provider every 1 3 years if you are 20 54 years of age. After age 40 years, you should have the exam every year.    · Get mammogram tests as directed by your health care provider.    · Understand the normal appearance and feel of your breasts and perform breast self-exams.    · Only take over-the-counter or prescription medicines as directed by your health care provider.    · Wear a supportive bra, especially when exercising.    · Avoid caffeine.    · Reduce your salt intake, especially before your menstrual period. Too much salt can cause fluid retention, breast   Your breast is still causing pain after your menstrual period is over.   You need medicine for breast pain and swelling that occurs with your menstrual period.  SEEK IMMEDIATE MEDICAL CARE IF:   You have severe pain, tenderness, redness, or warmth in your breast.   You have nipple discharge or bleeding.   Your breast lump becomes hard and painful.   You find new lumps or bumps that were not there before.   You feel lumps in your armpit (axilla).   You notice dimpling or wrinkling of the breast or nipple.   You  have a fever.  MAKE SURE YOU:  Understand these instructions.  Will watch your condition.  Will get help right away if you are not doing well or get worse. Document Released: 09/13/2005 Document Revised: 05/16/2013 Document Reviewed: 04/12/2013 [facility] Patient Information 2014 [place], [place]. Diagnostic mammogram and [place] 2/17 a 1:30 pm at breast center

## 2013-11-13 ENCOUNTER — Ambulatory Visit
Admission: RE | Admit: 2013-11-13 | Discharge: 2013-11-13 | Disposition: A | Discharge status: Home / Self Care | Payer: BC Managed Care – PPO | Source: Ambulatory Visit | Attending: Adult Health | Admitting: Adult Health

## 2013-11-13 ENCOUNTER — Other Ambulatory Visit: Payer: Self-pay | Admitting: Adult Health

## 2013-11-13 ENCOUNTER — Telehealth: Payer: Self-pay | Admitting: Adult Health

## 2013-11-13 DIAGNOSIS — N6325 Unspecified lump in the left breast, overlapping quadrants: Secondary | ICD-10-CM

## 2013-11-13 DIAGNOSIS — N6320 Unspecified lump in the left breast, unspecified quadrant: Secondary | ICD-10-CM

## 2013-11-13 DIAGNOSIS — N644 Mastodynia: Secondary | ICD-10-CM

## 2013-11-13 NOTE — Telephone Encounter (Signed)
Pt good is aware mammogram was ok

## 2013-11-19 ENCOUNTER — Encounter: Payer: Self-pay | Admitting: Obstetrics and Gynecology

## 2013-11-19 ENCOUNTER — Ambulatory Visit (INDEPENDENT_AMBULATORY_CARE_PROVIDER_SITE_OTHER): Payer: BC Managed Care – PPO | Admitting: Obstetrics and Gynecology

## 2013-11-19 ENCOUNTER — Other Ambulatory Visit (HOSPITAL_COMMUNITY)
Admission: RE | Admit: 2013-11-19 | Discharge: 2013-11-19 | Disposition: A | Discharge status: Home / Self Care | Payer: BC Managed Care – PPO | Source: Ambulatory Visit | Attending: Obstetrics and Gynecology | Admitting: Obstetrics and Gynecology

## 2013-11-19 VITALS — BP 124/82 | Ht 65.5 in | Wt 193.0 lb

## 2013-11-19 DIAGNOSIS — Z1151 Encounter for screening for human papillomavirus (HPV): Secondary | ICD-10-CM | POA: Insufficient documentation

## 2013-11-19 DIAGNOSIS — Z0000 Encounter for general adult medical examination without abnormal findings: Secondary | ICD-10-CM

## 2013-11-19 DIAGNOSIS — Z1212 Encounter for screening for malignant neoplasm of rectum: Secondary | ICD-10-CM

## 2013-11-19 DIAGNOSIS — Z124 Encounter for screening for malignant neoplasm of cervix: Secondary | ICD-10-CM | POA: Insufficient documentation

## 2013-11-19 DIAGNOSIS — Z01419 Encounter for gynecological examination (general) (routine) without abnormal findings: Secondary | ICD-10-CM

## 2013-11-19 LAB — POC HEMOCCULT BLD/STL (OFFICE/1-CARD/DIAGNOSTIC): FECAL OCCULT BLD: NEGATIVE

## 2013-11-19 NOTE — Patient Instructions (Signed)
Conjugated Estrogens; Bazedoxifene tablets What is this medicine? CONJUGATED ESTROGENS; BAZEDOXIFENE (CON ju gate ed ESS troe jenz; BAY ze DOX i feen) is used as hormone replacement in menopausal women who still have their uterus. This medicine helps to treat hot flashes and prevent osteoporosis (weak bones). This medicine may be used for other purposes; ask your health care provider or pharmacist if you have questions. COMMON BRAND NAME(S): DUAVEE What should I tell my health care provider before I take this medicine? They need to know if you have any of these conditions: -abnormal vaginal bleeding -blood vessel disease or blood clots -breast, cervical, endometrial, ovarian, liver, or uterine cancer -dementia -diabetes -endometriosis -fibroids -gallbladder disease -heart disease or recent heart attack -hereditary angioedema -high blood pressure -high cholesterol -high level of calcium in the blood -kidney disease -liver disease -mental depression -migraine headaches -porphyria -protein C deficiency -protein S deficiency -seizure disorder -stroke -systemic lupus erythematosus -thyroid disorder -tobacco smoker -an unusual or allergic reaction to estrogens, bazedoxifene, other medicines, foods, dyes, or preservatives -pregnant or trying to get pregnant -breast-feeding How should I use this medicine? Take this medicine by mouth with a glass of water. Follow the directions on the prescription label. Take your medicine at regular intervals, at the same time each day. Do not take your medicine more often than directed. A patient package insert for the product will be given with each prescription and refill. Read this sheet carefully each time. Talk to your pediatrician regarding the use of this medicine in children. This medicine is not approved for use in children. Overdosage: If you think you've taken too much of this medicine contact a poison control center or emergency room at  once. Overdosage: If you think you have taken too much of this medicine contact a poison control center or emergency room at once. NOTE: This medicine is only for you. Do not share this medicine with others. What if I miss a dose? If you miss a dose, take it as soon as you can. If it is almost time for your next dose, take only that dose. Do not take double or extra doses. What may interact with this medicine? -aromatase inhibitors like aminoglutethimide, anastrozole, exemestane, letrozole, testolactone -metyrapone  This medicine may also interact with the following medications: -barbiturates, such as phenobarbital -carbamazepine -clarithromycin -erythromycin -grapefruit juice -medicines for fungal infections like ketoconazole and itraconazole -phenytoin -rifampin -ritonavir -St. Erica Lynch's Wort -thyroid hormones This list may not describe all possible interactions. Give your health care provider a list of all the medicines, herbs, non-prescription drugs, or dietary supplements you use. Also tell them if you smoke, drink alcohol, or use illegal drugs. Some items may interact with your medicine. What should I watch for while using this medicine? Do not take a progestin product or additional estrogen or estrogen-like products while taking this drug. Discuss the risks and benefits of taking this drug with your prescriber. Visit your health care professional for regular checks on your progress. You will need a regular breast and pelvic exam and Pap smear while on this medicine. You should also discuss the need for regular mammograms with your health care professional, and follow his or her guidelines for these tests. This medicine can make your body retain fluid, making your fingers, hands, or ankles swell. Your blood pressure can go up. Contact your doctor or health care professional if you feel you are retaining fluid. You should make sure you get enough calcium and vitamin D in your diet while  you are taking this medicine. Discuss your dietary needs with your health care professional or nutritionist. Exercise may help to prevent bone loss. Discuss your exercise needs with your doctor or health care professional. This medicine can rarely cause blood clots. You should avoid long periods of bed rest while taking this medicine. If you are going to have surgery, tell your doctor or health care professional that you are taking this medicine. This medicine should be stopped at least 3 days before surgery. After surgery, it should be restarted only after you are walking again. It should not be restarted while you still need long periods of bed rest. Smoking increases the risk of getting a blood clot or having a stroke while you are taking this medicine, especially if you are more than 54 years old. You are strongly advised not to smoke. If you have any reason to think you are pregnant; stop taking this medicine at once and contact your doctor or health care professional. If you wear contact lenses and notice visual changes, or if the lenses begin to feel uncomfortable, consult your eye care specialist. What side effects may I notice from receiving this medicine? Side effects that you should report to your doctor or health care professional as soon as possible: -allergic reactions like skin rash, itching or hives, swelling of the face, lips, or tongue -breast tissue changes or discharge -changes in vision -chest pain -confusion, trouble speaking or understanding -dark urine -general ill feeling or flu-like symptoms -light-colored stools -nausea, vomiting -pain, swelling, warmth in the leg -right upper belly pain -severe headaches -shortness of breath -sudden numbness or weakness of the face, arm or leg -trouble walking, dizziness, loss of balance or coordination -unusual vaginal bleeding -yellowing of the eyes or skin  Side effects that usually do not require medical attention (Report  these to your doctor or health care professional if they continue or are bothersome.): -abdominal pain -diarrhea -dizziness -hair loss -increased hunger or thirst -increased urination -nausea -symptoms of vaginal infection like itching, irritation or unusual discharge -unusually weak or tired -upset stomach This list may not describe all possible side effects. Call your doctor for medical advice about side effects. You may report side effects to FDA at 1-800-FDA-1088. Where should I keep my medicine? Keep out of the reach of children. Store at room temperature between 15 and 30 degrees C (59 and 86 degrees F). Throw away any unused medicine after the expiration date. NOTE: This sheet is a summary. It may not cover all possible information. If you have questions about this medicine, talk to your doctor, pharmacist, or health care provider.  2014, Elsevier/Gold Standard. (2012-07-03 15:15:52)

## 2013-11-19 NOTE — Progress Notes (Signed)
Patient ID: Erica Lynch, female   DOB: Sep 02, 1961, 53 y.o.   MRN: [id number]  Assessment:  Annual Gyn Exam S/p recent Normal Diagnostic Mammogram LEFT breast, with followup recommended in 1 yr.  Hx ASCUS with - HPV in 2014, for 1 yr followup. Perimenopause will review DUAVEE INFO  And consider HT. Plan:  1. pap smear done, next pap due 65yr 2. return annually or prn 3    Annual mammogram advised Subjective:  Erica Lynch is a 53 y.o. female G1P1 who presents for annual exam. Patient's last menstrual period was 10/15/2013. The patient has complaints today of intermittent vasomotory sx ,and cycles q 63months, no PCB, IMB  The following portions of the patient's history were reviewed and updated as appropriate: allergies, current medications, past family history, past medical history, past social history, past surgical history and problem list.  Review of Systems Constitutional: negative Gastrointestinal: negative colonoscopy at 51 normal Genitourinary: no incontinence, s/p c/s   Objective:  BP 124/82  Ht 5' 5.5" (1.664 m)  Wt 193 lb (87.544 kg)  BMI 31.62 kg/m2  LMP 10/15/2013   BMI: Body mass index is 31.62 kg/(m^2).  General Appearance: Alert, appropriate appearance for age. No acute distress HEENT: Grossly normal Neck / Thyroid:  Cardiovascular: RRR; normal S1, S2, no murmur Lungs: CTA bilaterally Back: No CVAT Breast Exam: normal exam to me and to pt. Pt deniesa any breast pain at this time, and No masses or nodes.No dimpling, nipple retraction or discharge. Gastrointestinal: Soft, non-tender, no masses or organomegaly Pelvic Exam: Vulva and vagina appear normal. Bimanual exam reveals normal uterus and adnexa. External genitalia: normal general appearance Vaginal: normal mucosa without prolapse or lesions Cervix: normal appearance Adnexa: normal bimanual exam Uterus: normal single, nontender Rectal: good sphincter tone, no masses and guaiac negative Rectovaginal:  normal rectal, no masses Lymphatic Exam: Non-palpable nodes in neck, clavicular, axillary, or inguinal regions Skin: no rash or abnormalities Neurologic: Normal gait and speech, no tremor  Psychiatric: Alert and oriented, appropriate affect.  Urinalysis:Not done  Mallory Shirk. MD Pgr [phone] 8:48 AM

## 2013-11-21 ENCOUNTER — Telehealth: Payer: Self-pay | Admitting: Obstetrics and Gynecology

## 2013-11-23 ENCOUNTER — Telehealth: Payer: Self-pay | Admitting: Obstetrics and Gynecology

## 2013-11-23 MED ORDER — CONJ ESTROGENS-BAZEDOXIFENE 0.45-20 MG PO TABS: 1.0000 | ORAL_TABLET | Freq: Every day | ORAL | Status: DC

## 2013-11-23 NOTE — Telephone Encounter (Signed)
rx called to pharmacy of record by [facility]

## 2014-02-01 ENCOUNTER — Other Ambulatory Visit: Payer: Self-pay | Admitting: Physician Assistant

## 2014-02-19 ENCOUNTER — Ambulatory Visit (INDEPENDENT_AMBULATORY_CARE_PROVIDER_SITE_OTHER): Payer: BC Managed Care – PPO | Admitting: Adult Health

## 2014-02-19 ENCOUNTER — Encounter: Payer: Self-pay | Admitting: Adult Health

## 2014-02-19 VITALS — BP 118/82 | Ht 64.0 in | Wt 181.4 lb

## 2014-02-19 DIAGNOSIS — Z87898 Personal history of other specified conditions: Secondary | ICD-10-CM | POA: Insufficient documentation

## 2014-02-19 HISTORY — DX: Personal history of other specified conditions: Z87.898

## 2014-02-19 NOTE — Progress Notes (Signed)
Subjective:     Patient ID: Erica Lynch, female   DOB: 01-May-1961, 53 y.o.   MRN: [id number]  HPI [name] is a 53 year old white female in for breast exam.  Review of Systems See HPI Reviewed past medical,surgical, social and family history. Reviewed medications and allergies.     Objective:   Physical Exam BP 118/82  Ht 5\' 4"  (1.626 m)  Wt 181 lb 6.4 oz (82.283 kg)  BMI 31.12 kg/m2  Skin warm and dry,  Breasts:no dominate palpable mass, retraction or nipple discharge.Has regular irregularities in left breast esp 3-7 0 o'clock, breast dense.she stopped duavee due to leg cramps and has recently started back on Prozac,told her this may help with hot flashes if not let me know.    Assessment:     History of breast lump    Plan:     Follow up in 3 months to recheck breast

## 2014-02-19 NOTE — Patient Instructions (Signed)
Follow up in 3 months

## 2014-02-20 ENCOUNTER — Telehealth: Payer: Self-pay | Admitting: Adult Health

## 2014-02-20 DIAGNOSIS — N6320 Unspecified lump in the left breast, unspecified quadrant: Secondary | ICD-10-CM

## 2014-02-20 NOTE — Telephone Encounter (Signed)
Will schedule left diagnostic mammogram and [place] to check out irregularities in left breast, husband worried,

## 2014-02-20 NOTE — Telephone Encounter (Signed)
Spoke with pt. Pt's husband is very concerned with breast issues and wants to know if she should have further testing. Please advise. [place]

## 2014-02-21 NOTE — Telephone Encounter (Signed)
Pt aware mammogram /US 6/10 at 10:45 at [facility]

## 2014-02-21 NOTE — Addendum Note (Signed)
Addended by: Derrek Monaco A on: 02/21/2014 01:27 PM   Modules accepted: Orders

## 2014-03-06 ENCOUNTER — Encounter (HOSPITAL_COMMUNITY): Payer: Self-pay

## 2014-03-06 ENCOUNTER — Ambulatory Visit (HOSPITAL_COMMUNITY)
Admission: RE | Admit: 2014-03-06 | Discharge: 2014-03-06 | Disposition: A | Discharge status: Home / Self Care | Payer: BC Managed Care – PPO | Source: Ambulatory Visit | Attending: Adult Health | Admitting: Adult Health

## 2014-03-06 ENCOUNTER — Telehealth: Payer: Self-pay | Admitting: Adult Health

## 2014-03-06 ENCOUNTER — Other Ambulatory Visit (HOSPITAL_COMMUNITY): Payer: Self-pay | Admitting: Internal Medicine

## 2014-03-06 ENCOUNTER — Other Ambulatory Visit: Payer: Self-pay | Admitting: Adult Health

## 2014-03-06 ENCOUNTER — Ambulatory Visit (HOSPITAL_COMMUNITY)
Admission: RE | Admit: 2014-03-06 | Discharge: 2014-03-06 | Disposition: A | Discharge status: Home / Self Care | Payer: BC Managed Care – PPO | Source: Ambulatory Visit | Attending: Internal Medicine | Admitting: Internal Medicine

## 2014-03-06 DIAGNOSIS — M25559 Pain in unspecified hip: Secondary | ICD-10-CM | POA: Insufficient documentation

## 2014-03-06 DIAGNOSIS — M25551 Pain in right hip: Secondary | ICD-10-CM

## 2014-03-06 DIAGNOSIS — N6019 Diffuse cystic mastopathy of unspecified breast: Secondary | ICD-10-CM | POA: Insufficient documentation

## 2014-03-06 DIAGNOSIS — N6320 Unspecified lump in the left breast, unspecified quadrant: Secondary | ICD-10-CM

## 2014-03-06 NOTE — Telephone Encounter (Signed)
Pt aware of [place] and mammogram

## 2014-05-22 ENCOUNTER — Ambulatory Visit (INDEPENDENT_AMBULATORY_CARE_PROVIDER_SITE_OTHER): Payer: BC Managed Care – PPO | Admitting: Adult Health

## 2014-05-22 ENCOUNTER — Encounter: Payer: Self-pay | Admitting: Adult Health

## 2014-05-22 VITALS — BP 138/80 | Ht 64.0 in | Wt 186.0 lb

## 2014-05-22 DIAGNOSIS — R1032 Left lower quadrant pain: Secondary | ICD-10-CM | POA: Insufficient documentation

## 2014-05-22 DIAGNOSIS — N949 Unspecified condition associated with female genital organs and menstrual cycle: Secondary | ICD-10-CM

## 2014-05-22 DIAGNOSIS — R319 Hematuria, unspecified: Secondary | ICD-10-CM | POA: Insufficient documentation

## 2014-05-22 DIAGNOSIS — Z87898 Personal history of other specified conditions: Secondary | ICD-10-CM

## 2014-05-22 HISTORY — DX: Left lower quadrant pain: R10.32

## 2014-05-22 HISTORY — DX: Unspecified condition associated with female genital organs and menstrual cycle: N94.9

## 2014-05-22 HISTORY — DX: Hematuria, unspecified: R31.9

## 2014-05-22 LAB — POCT URINALYSIS DIPSTICK
Glucose, UA: NEGATIVE
LEUKOCYTES UA: NEGATIVE
Nitrite, UA: NEGATIVE

## 2014-05-22 MED ORDER — NITROFURANTOIN MONOHYD MACRO 100 MG PO CAPS: 100.0000 mg | ORAL_CAPSULE | Freq: Two times a day (BID) | ORAL | Status: DC

## 2014-05-22 NOTE — Addendum Note (Signed)
Addended by: Derrek Monaco A on: 05/22/2014 09:43 AM   Modules accepted: Level of Service

## 2014-05-22 NOTE — Patient Instructions (Signed)
Pelvic Pain Female pelvic pain can be caused by many different things and start from a variety of places. Pelvic pain refers to pain that is located in the lower half of the abdomen and between your hips. The pain may occur over a short period of time (acute) or may be reoccurring (chronic). The cause of pelvic pain may be related to disorders affecting the female reproductive organs (gynecologic), but it may also be related to the bladder, kidney stones, an intestinal complication, or muscle or skeletal problems. Getting help right away for pelvic pain is important, especially if there has been severe, sharp, or a sudden onset of unusual pain. It is also important to get help right away because some types of pelvic pain can be life threatening.  CAUSES  Below are only some of the causes of pelvic pain. The causes of pelvic pain can be in one of several categories.   Gynecologic.  Pelvic inflammatory disease.  Sexually transmitted infection.  Ovarian cyst or a twisted ovarian ligament (ovarian torsion).  Uterine lining that grows outside the uterus (endometriosis).  Fibroids, cysts, or tumors.  Ovulation.  Pregnancy.  Pregnancy that occurs outside the uterus (ectopic pregnancy).  Miscarriage.  Labor.  Abruption of the placenta or ruptured uterus.  Infection.  Uterine infection (endometritis).  Bladder infection.  Diverticulitis.  Miscarriage related to a uterine infection (septic abortion).  Bladder.  Inflammation of the bladder (cystitis).  Kidney stone(s).  Gastrointestinal.  Constipation.  Diverticulitis.  Neurologic.  Trauma.  Feeling pelvic pain because of mental or emotional causes (psychosomatic).  Cancers of the bowel or pelvis. EVALUATION  Your caregiver will want to take a careful history of your concerns. This includes recent changes in your health, a careful gynecologic history of your periods (menses), and a sexual history. Obtaining your family  history and medical history is also important. Your caregiver may suggest a pelvic exam. A pelvic exam will help identify the location and severity of the pain. It also helps in the evaluation of which organ system may be involved. In order to identify the cause of the pelvic pain and be properly treated, your caregiver may order tests. These tests may include:   A pregnancy test.  Pelvic ultrasonography.  An X-ray exam of the abdomen.  A urinalysis or evaluation of vaginal discharge.  Blood tests. HOME CARE INSTRUCTIONS   Only take over-the-counter or prescription medicines for pain, discomfort, or fever as directed by your caregiver.   Rest as directed by your caregiver.   Eat a balanced diet.   Drink enough fluids to make your urine clear or pale yellow, or as directed.   Avoid sexual intercourse if it causes pain.   Apply warm or cold compresses to the lower abdomen depending on which one helps the pain.   Avoid stressful situations.   Keep a journal of your pelvic pain. Write down when it started, where the pain is located, and if there are things that seem to be associated with the pain, such as food or your menstrual cycle.  Follow up with your caregiver as directed.  SEEK MEDICAL CARE IF:  Your medicine does not help your pain.  You have abnormal vaginal discharge. SEEK IMMEDIATE MEDICAL CARE IF:   You have heavy bleeding from the vagina.   Your pelvic pain increases.   You feel light-headed or faint.   You have chills.   You have pain with urination or blood in your urine.   You have uncontrolled diarrhea   or vomiting.   You have a fever or persistent symptoms for more than 3 days.  You have a fever and your symptoms suddenly get worse.   You are being physically or sexually abused.  MAKE SURE YOU:  Understand these instructions.  Will watch your condition.  Will get help if you are not doing well or get worse. Document Released:  08/10/2004 Document Revised: 01/28/2014 Document Reviewed: 01/03/2012 [facility] Patient Information 2015 [place], [place]. This information is not intended to replace advice given to you by your health care provider. Make sure you discuss any questions you have with your health care provider. Hematuria Hematuria is blood in your urine. It can be caused by a bladder infection, kidney infection, prostate infection, kidney stone, or cancer of your urinary tract. Infections can usually be treated with medicine, and a kidney stone usually will pass through your urine. If neither of these is the cause of your hematuria, further workup to find out the reason may be needed. It is very important that you tell your health care provider about any blood you see in your urine, even if the blood stops without treatment or happens without causing pain. Blood in your urine that happens and then stops and then happens again can be a symptom of a very serious condition. Also, pain is not a symptom in the initial stages of many urinary cancers. HOME CARE INSTRUCTIONS   Drink lots of fluid, 3-4 quarts a day. If you have been diagnosed with an infection, cranberry juice is especially recommended, in addition to large amounts of water.  Avoid caffeine, tea, and carbonated beverages because they tend to irritate the bladder.  Avoid alcohol because it may irritate the prostate.  Take all medicines as directed by your health care provider.  If you were prescribed an antibiotic medicine, finish it all even if you start to feel better.  If you have been diagnosed with a kidney stone, follow your health care provider's instructions regarding straining your urine to catch the stone.  Empty your bladder often. Avoid holding urine for long periods of time.  After a bowel movement, women should cleanse front to back. Use each tissue only once.  Empty your bladder before and after sexual intercourse if you are a female. SEEK  MEDICAL CARE IF:  You develop back pain.  You have a fever.  You have a feeling of sickness in your stomach (nausea) or vomiting.  Your symptoms are not better in 3 days. Return sooner if you are getting worse. SEEK IMMEDIATE MEDICAL CARE IF:   You develop severe vomiting and are unable to keep the medicine down.  You develop severe back or abdominal pain despite taking your medicines.  You begin passing a large amount of blood or clots in your urine.  You feel extremely weak or faint, or you pass out. MAKE SURE YOU:   Understand these instructions.  Will watch your condition.  Will get help right away if you are not doing well or get worse. Document Released: 09/13/2005 Document Revised: 01/28/2014 Document Reviewed: 05/14/2013 [facility] Patient Information 2015 [place], [place]. This information is not intended to replace advice given to you by your health care provider. Make sure you discuss any questions you have with your health care provider. Take macrobid Return in 1 day for [place] Push fluids

## 2014-05-22 NOTE — Progress Notes (Signed)
Subjective:     Patient ID: Erica Lynch, female   DOB: [birth year], 53 y.o.   MRN: [id number]  HPI Erica Lynch is a 53 year old white female, married, in for 3 month beast exam and complains of pain on and off LLQ and hurts with BMs and voiding.LMP January.  Review of Systems See HPI Reviewed past medical,surgical, social and family history. Reviewed medications and allergies.     Objective:   Physical Exam BP 138/80  Ht 5\' 4"  (1.626 m)  Wt 186 lb (84.369 kg)  BMI 31.91 kg/m2  Urine 2+ blood trace protein, Skin warm and dry,  Breasts:no dominate palpable mass, retraction or nipple discharge, has regular irregularities esp on left. Pelvic: external genitalia is normal in appearance, vagina: has fair moisture,and rugae, cervix:smooth and bulbous, uterus: normal size, shape and contour, non tender, no masses felt, adnexa: no masses felt, some fullness LLQ and tenderness there.    Assessment:     LLQ pain  Hematuria Pelvic pain  History of left breast nodule(cyst)    Plan:     Rx macrobid 1 bid x 7 days Push fluids UA C&S Return in 1 day for [place] to assess LLQ pain and see me   Review handout on hematuria and pelvic pain

## 2014-05-23 ENCOUNTER — Ambulatory Visit (INDEPENDENT_AMBULATORY_CARE_PROVIDER_SITE_OTHER): Payer: BC Managed Care – PPO

## 2014-05-23 ENCOUNTER — Other Ambulatory Visit: Payer: Self-pay | Admitting: Adult Health

## 2014-05-23 ENCOUNTER — Ambulatory Visit (INDEPENDENT_AMBULATORY_CARE_PROVIDER_SITE_OTHER): Payer: BC Managed Care – PPO | Admitting: Adult Health

## 2014-05-23 ENCOUNTER — Encounter: Payer: Self-pay | Admitting: Adult Health

## 2014-05-23 VITALS — BP 138/82 | Ht 64.0 in | Wt 185.0 lb

## 2014-05-23 DIAGNOSIS — N949 Unspecified condition associated with female genital organs and menstrual cycle: Secondary | ICD-10-CM

## 2014-05-23 DIAGNOSIS — N83209 Unspecified ovarian cyst, unspecified side: Secondary | ICD-10-CM | POA: Insufficient documentation

## 2014-05-23 HISTORY — DX: Unspecified ovarian cyst, unspecified side: N83.209

## 2014-05-23 LAB — URINALYSIS
BILIRUBIN URINE: NEGATIVE
GLUCOSE, UA: NEGATIVE mg/dL
HGB URINE DIPSTICK: NEGATIVE
KETONES UR: NEGATIVE mg/dL
Leukocytes, UA: NEGATIVE
Nitrite: NEGATIVE
PROTEIN: NEGATIVE mg/dL
Specific Gravity, Urine: 1.015 (ref 1.005–1.030)
Urobilinogen, UA: 0.2 mg/dL (ref 0.0–1.0)
pH: 6 (ref 5.0–8.0)

## 2014-05-23 NOTE — Patient Instructions (Signed)
Ovarian Cyst An ovarian cyst is a fluid-filled sac that forms on an ovary. The ovaries are small organs that produce eggs in women. Various types of cysts can form on the ovaries. Most are not cancerous. Many do not cause problems, and they often go away on their own. Some may cause symptoms and require treatment. Common types of ovarian cysts include:  Functional cysts--These cysts may occur every month during the menstrual cycle. This is normal. The cysts usually go away with the next menstrual cycle if the woman does not get pregnant. Usually, there are no symptoms with a functional cyst.  Endometrioma cysts--These cysts form from the tissue that lines the uterus. They are also called "chocolate cysts" because they become filled with blood that turns brown. This type of cyst can cause pain in the lower abdomen during intercourse and with your menstrual period.  Cystadenoma cysts--This type develops from the cells on the outside of the ovary. These cysts can get very big and cause lower abdomen pain and pain with intercourse. This type of cyst can twist on itself, cut off its blood supply, and cause severe pain. It can also easily rupture and cause a lot of pain.  Dermoid cysts--This type of cyst is sometimes found in both ovaries. These cysts may contain different kinds of body tissue, such as skin, teeth, hair, or cartilage. They usually do not cause symptoms unless they get very big.  Theca lutein cysts--These cysts occur when too much of a certain hormone (human chorionic gonadotropin) is produced and overstimulates the ovaries to produce an egg. This is most common after procedures used to assist with the conception of a baby (in vitro fertilization). CAUSES   Fertility drugs can cause a condition in which multiple large cysts are formed on the ovaries. This is called ovarian hyperstimulation syndrome.  A condition called polycystic ovary syndrome can cause hormonal imbalances that can lead to  nonfunctional ovarian cysts. SIGNS AND SYMPTOMS  Many ovarian cysts do not cause symptoms. If symptoms are present, they may include:  Pelvic pain or pressure.  Pain in the lower abdomen.  Pain during sexual intercourse.  Increasing girth (swelling) of the abdomen.  Abnormal menstrual periods.  Increasing pain with menstrual periods.  Stopping having menstrual periods without being pregnant. DIAGNOSIS  These cysts are commonly found during a routine or annual pelvic exam. Tests may be ordered to find out more about the cyst. These tests may include:  Ultrasound.  X-ray of the pelvis.  CT scan.  MRI.  Blood tests. TREATMENT  Many ovarian cysts go away on their own without treatment. Your health care provider may want to check your cyst regularly for 2-3 months to see if it changes. For women in menopause, it is particularly important to monitor a cyst closely because of the higher rate of ovarian cancer in menopausal women. When treatment is needed, it may include any of the following:  A procedure to drain the cyst (aspiration). This may be done using a long needle and ultrasound. It can also be done through a laparoscopic procedure. This involves using a thin, lighted tube with a tiny camera on the end (laparoscope) inserted through a small incision.  Surgery to remove the whole cyst. This may be done using laparoscopic surgery or an open surgery involving a larger incision in the lower abdomen.  Hormone treatment or birth control pills. These methods are sometimes used to help dissolve a cyst. HOME CARE INSTRUCTIONS   Only take over-the-counter   or prescription medicines as directed by your health care provider.  Follow up with your health care provider as directed.  Get regular pelvic exams and Pap tests. SEEK MEDICAL CARE IF:   Your periods are late, irregular, or painful, or they stop.  Your pelvic pain or abdominal pain does not go away.  Your abdomen becomes  larger or swollen.  You have pressure on your bladder or trouble emptying your bladder completely.  You have pain during sexual intercourse.  You have feelings of fullness, pressure, or discomfort in your stomach.  You lose weight for no apparent reason.  You feel generally ill.  You become constipated.  You lose your appetite.  You develop acne.  You have an increase in body and facial hair.  You are gaining weight, without changing your exercise and eating habits.  You think you are pregnant. SEEK IMMEDIATE MEDICAL CARE IF:   You have increasing abdominal pain.  You feel sick to your stomach (nauseous), and you throw up (vomit).  You develop a fever that comes on suddenly.  You have abdominal pain during a bowel movement.  Your menstrual periods become heavier than usual. MAKE SURE YOU:  Understand these instructions.  Will watch your condition.  Will get help right away if you are not doing well or get worse. Document Released: 09/13/2005 Document Revised: 09/18/2013 Document Reviewed: 05/21/2013 [facility] Patient Information 2015 [place], [place]. This information is not intended to replace advice given to you by your health care provider. Make sure you discuss any questions you have with your health care provider. Return in 3 months

## 2014-05-23 NOTE — Progress Notes (Signed)
Subjective:     Patient ID: Erica Lynch, female   DOB: [birth year], 52 y.o.   MRN: [id number]  HPI [name] is a 53 year old white female in for [place] for LLQ pain on exam yesterday.  Review of Systems See HPI Reviewed past medical,surgical, social and family history. Reviewed medications and allergies.     Objective:   Physical Exam BP 138/82  Ht 5\' 4"  (1.626 m)  Wt 185 lb (83.915 kg)  BMI 31.74 kg/m2Reviewed [place] with pt:   Uterus 7.8 x 6.0 x 4.3 cm, anteverted inhomogeneous echotexture of myometrium although no obvious mass noted  Endometrium 3.4 mm, symmetrical, (s/p ablation)  Right ovary 1.9 x 1.0 x 1.0 cm,  Left ovary 2.0 x 1.6 x 1.4 cm, with 1.2cm simple cystic area noted  No free fluid noted within the pelvis  Technician Comments:  Anteverted uterus, inhomogeneous myometrium noted although no mass noted, ENdom-3.25mm, Rt ovary appears WNL, LT ovary with 1.2cm simple cyst noted, no free fluid noted  Urine showed no blood, await C&S.   Assessment:     Simple ovarian cyst on left    Plan:     Review handout on ovarian cyst Follow up in 3 months for breast exam

## 2014-05-24 ENCOUNTER — Telehealth: Payer: Self-pay | Admitting: Adult Health

## 2014-05-24 LAB — URINE CULTURE

## 2014-05-24 NOTE — Telephone Encounter (Signed)
Pt aware of culture 

## 2014-07-19 ENCOUNTER — Ambulatory Visit: Payer: Self-pay | Admitting: Gastroenterology

## 2014-07-29 ENCOUNTER — Encounter: Payer: Self-pay | Admitting: Adult Health

## 2014-08-15 ENCOUNTER — Encounter: Payer: Self-pay | Admitting: Gastroenterology

## 2014-08-15 ENCOUNTER — Ambulatory Visit (INDEPENDENT_AMBULATORY_CARE_PROVIDER_SITE_OTHER): Payer: BC Managed Care – PPO | Admitting: Gastroenterology

## 2014-08-15 VITALS — BP 120/83 | HR 76 | Temp 97.7°F | Ht 64.0 in | Wt 188.0 lb

## 2014-08-15 DIAGNOSIS — K601 Chronic anal fissure: Secondary | ICD-10-CM

## 2014-08-15 NOTE — Progress Notes (Signed)
Primary Care Physician:  Asencion Noble, MD  Primary Gastroenterologist:  Garfield Cornea, MD   Chief Complaint  Patient presents with  . Rectal Pain  . Rectal Pressure  . Rectal Bleeding    HPI:  Erica Lynch is a 53 y.o. female here for further evaluation of rectal pain, rectal bleeding. Patient was last seen in 2012 at time of her screening colonoscopy which was normal.   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 Pain with BM and shortly after. Normally just blood on the toilet paper. Last time on outside of  Most recent episode lasted about 2 weeks. Denies constipation. Started stool softener with last flare up. Currently asymptomatic. Denies abdominal pain, heartburn, melena, unintentional weight loss.    Current Outpatient Prescriptions  Medication Sig Dispense Refill  . clonazePAM (KLONOPIN) 0.5 MG tablet Take 1 mg by mouth at bedtime as needed. For sleep    . FLUoxetine (PROZAC) 20 MG tablet Take 60 mg by mouth daily.      No current facility-administered medications for this visit.    Allergies as of 08/15/2014  . (No Known Allergies)    Past Medical History  Diagnosis Date  . IBS (irritable bowel syndrome)   . Anxiety   . Hyperlipidemia   . PONV (postoperative nausea and vomiting)   . Left breast mass 11/07/2013  . History of breast lump 02/19/2014  . Unspecified symptom associated with female genital organs 05/22/2014  . Hematuria 05/22/2014  . LLQ pain 05/22/2014  . Other and unspecified ovarian cyst 05/23/2014    Past Surgical History  Procedure Laterality Date  . Cesarean section  1996  . Other surgical history      Uterine ablation  . Endometrial ablation  2-3 yrs ago    [facility]  . Tubal ligation  6 yrs ago    [facility]  . Colonoscopy  08/2011    Dr. Gala Romney: normal    Family History  Problem Relation Age of Onset  . Lung cancer Mother   . Breast cancer Mother   . Cancer Mother     breast  . Cirrhosis Father     etoh   . Anesthesia problems  Neg Hx   . Hypotension Neg Hx   . Malignant hyperthermia Neg Hx   . Pseudochol deficiency Neg Hx   . Diabetes Maternal Grandfather     History   Social History  . Marital Status: Married    Spouse Name: N/A    Number of Children: 1  . Years of Education: N/A   Occupational History  . [employer]    Social History Main Topics  . Smoking status: Never Smoker   . Smokeless tobacco: Never Used  . Alcohol Use: Yes     Comment: glass of wine once a month sometime not at all  . Drug Use: No  . Sexual Activity: Yes    Birth Control/ Protection: Surgical   Other Topics Concern  . Not on file   Social History Narrative   1 son-16            ROS:  General: Negative for anorexia, weight loss, fever, chills, fatigue, weakness. Eyes: Negative for vision changes.  ENT: Negative for hoarseness, difficulty swallowing , nasal congestion. CV: Negative for chest pain, angina, palpitations, dyspnea on exertion, peripheral edema.  Respiratory: Negative for dyspnea at rest, dyspnea on exertion, cough, sputum, wheezing.  GI: See history of present illness. GU:  Negative for dysuria, hematuria, urinary incontinence, urinary frequency,  nocturnal urination.  MS: Negative for joint pain, low back pain.  Derm: Negative for rash or itching.  Neuro: Negative for weakness, abnormal sensation, seizure, frequent headaches, memory loss, confusion.  Psych: Negative for anxiety, depression, suicidal ideation, hallucinations.  Endo: Negative for unusual weight change.  Heme: Negative for bruising or bleeding. Allergy: Negative for rash or hives.    Physical Examination:  BP 120/83 mmHg  Pulse 76  Temp(Src) 97.7 F (36.5 C) (Oral)  Ht 5\' 4"  (1.626 m)  Wt 188 lb (85.276 kg)  BMI 32.25 kg/m2   General: Well-nourished, well-developed in no acute distress.  Head: Normocephalic, atraumatic.   Eyes: Conjunctiva pink, no icterus. Mouth: Oropharyngeal mucosa moist and pink , no lesions  erythema or exudate. Neck: Supple without thyromegaly, masses, or lymphadenopathy.  Lungs: Clear to auscultation bilaterally.  Heart: Regular rate and rhythm, no murmurs rubs or gallops.  Abdomen: Bowel sounds are normal, nontender, nondistended, no hepatosplenomegaly or masses, no abdominal bruits or    hernia , no rebound or guarding.   Rectal: no external abnormalities. nontender DRE, brown secretions, heme negative.  Extremities: No lower extremity edema. No clubbing or deformities.  Neuro: Alert and oriented x 4 , grossly normal neurologically.  Skin: Warm and dry, no rash or jaundice.   Psych: Alert and cooperative, normal mood and affect.

## 2014-08-15 NOTE — Patient Instructions (Signed)
   Please call if you have recurrent rectal pain and/or bleeding that lasts for more than 48 hours. We would start nitrogylcerin ointment with the next flare.  I will discuss today's visit with Dr. Gala Romney and let you know if he recommends further evaluation with colonoscopy.   Anal Fissure, Adult An anal fissure is a small tear or crack in the skin around the anus. Bleeding from a fissure usually stops on its own within a few minutes. However, bleeding will often reoccur with each bowel movement until the crack heals.  CAUSES   Passing large, hard stools.  Frequent diarrheal stools.  Constipation.  Inflammatory bowel disease (Crohn's disease or ulcerative colitis).  Infections.  Anal sex. SYMPTOMS   Small amounts of blood seen on your stools, on toilet paper, or in the toilet after a bowel movement.  Rectal bleeding.  Painful bowel movements.  Itching or irritation around the anus. DIAGNOSIS Your caregiver will examine the anal area. An anal fissure can usually be seen with careful inspection. A rectal exam may be performed and a short tube (anoscope) may be used to examine the anal canal. TREATMENT   You may be instructed to take fiber supplements. These supplements can soften your stool to help make bowel movements easier.  Sitz baths may be recommended to help heal the tear. Do not use soap in the sitz baths.  A medicated cream or ointment may be prescribed to lessen discomfort. HOME CARE INSTRUCTIONS   Maintain a diet high in fruits, whole grains, and vegetables. Avoid constipating foods like bananas and dairy products.  Take sitz baths as directed by your caregiver.  Drink enough fluids to keep your urine clear or pale yellow.  Only take over-the-counter or prescription medicines for pain, discomfort, or fever as directed by your caregiver. Do not take aspirin as this may increase bleeding.  Do not use ointments containing numbing medications (anesthetics) or  hydrocortisone. They could slow healing. SEEK MEDICAL CARE IF:   Your fissure is not completely healed within 3 days.  You have further bleeding.  You have a fever.  You have diarrhea mixed with blood.  You have pain.  Your problem is getting worse rather than better. MAKE SURE YOU:   Understand these instructions.  Will watch your condition.  Will get help right away if you are not doing well or get worse. Document Released: 09/13/2005 Document Revised: 12/06/2011 Document Reviewed: 02/28/2011 [facility] Patient Information 2015 [place], [place]. This information is not intended to replace advice given to you by your health care provider. Make sure you discuss any questions you have with your health care provider.

## 2014-08-18 NOTE — Assessment & Plan Note (Signed)
Recent rectal bleeding/rectal pain likely due to anal fissure. Currently asymptomatic. Recommend avoiding constipation. With next flare, consider nitroglycerin ointment anally. I will discuss further with Dr. Gala Romney. Last colonoscopy 53 years old. Likely can avoid repeat at this time; however, if patient has progressive symptoms she would require colonoscopy.

## 2014-08-21 ENCOUNTER — Ambulatory Visit (INDEPENDENT_AMBULATORY_CARE_PROVIDER_SITE_OTHER): Payer: BC Managed Care – PPO | Admitting: Adult Health

## 2014-08-21 ENCOUNTER — Encounter: Payer: Self-pay | Admitting: Adult Health

## 2014-08-21 VITALS — BP 120/82 | Ht 64.0 in | Wt 188.0 lb

## 2014-08-21 DIAGNOSIS — Z87898 Personal history of other specified conditions: Secondary | ICD-10-CM

## 2014-08-21 NOTE — Patient Instructions (Signed)
Follow up in february

## 2014-08-21 NOTE — Progress Notes (Signed)
cc'ed to pcp °

## 2014-08-21 NOTE — Progress Notes (Signed)
Subjective:     Patient ID: LATIVIA VELIE, female   DOB: 09-25-1961, 53 y.o.   MRN: [id number]  HPI [name] is a 53 year old white female in for 3 month breast exam.No complaints.  Review of Systems See HPI Reviewed past medical,surgical, social and family history. Reviewed medications and allergies.     Objective:   Physical Exam BP 120/82 mmHg  Ht 5\' 4"  (1.626 m)  Wt 188 lb (85.276 kg)  BMI 32.25 kg/m2    Skin warm and dry,  Breasts:no dominate palpable mass, retraction or nipple discharge, there is fibroglandular like tissue at 5 o'clock left breast   Assessment:     History of breast lump    Plan:     Return in 3 months for pap and physical and Mammogram

## 2014-09-04 NOTE — Progress Notes (Signed)
Please let patient know that Dr. Gala Romney recommends treatment for anorectal fissure with nitroglycerin anally if symptoms recur; however if her symptoms are progressive or do not respond to treatment she will need another colonoscopy.

## 2014-09-09 NOTE — Progress Notes (Signed)
Tried to call pt- LMOM 

## 2014-09-13 NOTE — Progress Notes (Signed)
Tried to call pt- LMOM 

## 2014-10-01 NOTE — Progress Notes (Signed)
Tried to call pt- NA- LMOM 

## 2014-10-03 NOTE — Progress Notes (Signed)
Mailed letter to pt

## 2014-10-08 ENCOUNTER — Ambulatory Visit (HOSPITAL_COMMUNITY)
Admission: RE | Admit: 2014-10-08 | Discharge: 2014-10-08 | Disposition: A | Discharge status: Home / Self Care | Payer: BLUE CROSS/BLUE SHIELD | Source: Ambulatory Visit | Attending: Orthopedic Surgery | Admitting: Orthopedic Surgery

## 2014-10-08 ENCOUNTER — Ambulatory Visit (INDEPENDENT_AMBULATORY_CARE_PROVIDER_SITE_OTHER): Payer: BLUE CROSS/BLUE SHIELD | Admitting: Orthopedic Surgery

## 2014-10-08 ENCOUNTER — Encounter: Payer: Self-pay | Admitting: Orthopedic Surgery

## 2014-10-08 ENCOUNTER — Other Ambulatory Visit: Payer: Self-pay | Admitting: Orthopedic Surgery

## 2014-10-08 DIAGNOSIS — M5410 Radiculopathy, site unspecified: Secondary | ICD-10-CM | POA: Insufficient documentation

## 2014-10-08 DIAGNOSIS — M25551 Pain in right hip: Secondary | ICD-10-CM | POA: Diagnosis not present

## 2014-10-08 DIAGNOSIS — M5442 Lumbago with sciatica, left side: Secondary | ICD-10-CM

## 2014-10-08 MED ORDER — PREDNISONE (PAK) 5 MG PO TABS: ORAL_TABLET | ORAL | Status: DC

## 2014-10-08 NOTE — Progress Notes (Addendum)
Patient ID: Erica Lynch, female   DOB: Oct 22, 1960, 54 y.o.   MRN: [id number] Chief Complaint  Patient presents with  . Hip Pain    right hip/low back pain radiates down right leg    54 year old female presents with right-sided lumbar spine pain radiating in the posterior aspect of her right thigh to but not below the right knee. She's had this for several months no history of trauma. Red flags such as night sweats fevers travel to other countries bowel bladder dysfunction negative  Other review of systems have been recorded, and we should note that her night sweats are related to her menopause she does have some fatigue depression anxiety the numbness and tingling is related to her back and leg problem currently. Her back pain same limb pain joint pain same. All other systems were negative  Past Medical History  Diagnosis Date  . IBS (irritable bowel syndrome)   . Anxiety   . Hyperlipidemia   . PONV (postoperative nausea and vomiting)   . Left breast mass 11/07/2013  . History of breast lump 02/19/2014  . Unspecified symptom associated with female genital organs 05/22/2014  . Hematuria 05/22/2014  . LLQ pain 05/22/2014  . Other and unspecified ovarian cyst 05/23/2014   She's healthy well-developed well-nourished well-groomed oriented 3 mood is pleasant and affect is normal gait and station are normal. She does have tenderness right at the lower L5-S1 segment and the right SI joint and right buttock negative straight leg raises bilaterally. Symmetric normal pain-free hip range of motion bilaterally. Both lower extremities are stable hip and knee. Motor exam normal throughout both lower extremities. Skin over the lumbar spine thoracic and cervical spine normal, no pathologic skin lesions. Distal pulses are 2+ symmetric and intact. Reflexes are 2+ at the knee and ankle toes are downgoing straight leg raises are negative  X-ray show mild degenerative changes with anterior lipping of the vertebrae  L2-5 and S1. Mild loss of lumbar lordosis. Mild shift in the coronal plane of the spine thought to be reactive changes, I also interpret the pelvic film to show no significant degenerative changes of the hip joint  Encounter Diagnosis  Name Primary?  . Back pain with right-sided radiculopathy    We talked about various treatment options and we decided to go with a 5 mg steroid Dosepak for 12 days.  Follow-up if no improvement

## 2014-10-11 ENCOUNTER — Other Ambulatory Visit: Payer: Self-pay | Admitting: Obstetrics and Gynecology

## 2014-10-11 DIAGNOSIS — Z1231 Encounter for screening mammogram for malignant neoplasm of breast: Secondary | ICD-10-CM

## 2014-11-13 ENCOUNTER — Ambulatory Visit (HOSPITAL_COMMUNITY)
Admission: RE | Admit: 2014-11-13 | Discharge: 2014-11-13 | Disposition: A | Discharge status: Home / Self Care | Payer: BLUE CROSS/BLUE SHIELD | Source: Ambulatory Visit | Attending: Obstetrics and Gynecology | Admitting: Obstetrics and Gynecology

## 2014-11-13 DIAGNOSIS — Z1231 Encounter for screening mammogram for malignant neoplasm of breast: Secondary | ICD-10-CM | POA: Diagnosis not present

## 2014-11-21 ENCOUNTER — Other Ambulatory Visit: Payer: BC Managed Care – PPO | Admitting: Adult Health

## 2014-11-21 ENCOUNTER — Other Ambulatory Visit: Payer: Self-pay | Admitting: Adult Health

## 2014-11-21 DIAGNOSIS — R928 Other abnormal and inconclusive findings on diagnostic imaging of breast: Secondary | ICD-10-CM

## 2014-11-27 ENCOUNTER — Ambulatory Visit (INDEPENDENT_AMBULATORY_CARE_PROVIDER_SITE_OTHER): Payer: BLUE CROSS/BLUE SHIELD | Admitting: Adult Health

## 2014-11-27 ENCOUNTER — Other Ambulatory Visit (HOSPITAL_COMMUNITY)
Admission: RE | Admit: 2014-11-27 | Discharge: 2014-11-27 | Disposition: A | Discharge status: Home / Self Care | Payer: BLUE CROSS/BLUE SHIELD | Source: Ambulatory Visit | Attending: Adult Health | Admitting: Adult Health

## 2014-11-27 ENCOUNTER — Encounter: Payer: Self-pay | Admitting: Adult Health

## 2014-11-27 VITALS — BP 148/90 | HR 90 | Ht 64.25 in | Wt 190.5 lb

## 2014-11-27 DIAGNOSIS — Z1151 Encounter for screening for human papillomavirus (HPV): Secondary | ICD-10-CM | POA: Diagnosis present

## 2014-11-27 DIAGNOSIS — Z01419 Encounter for gynecological examination (general) (routine) without abnormal findings: Secondary | ICD-10-CM

## 2014-11-27 DIAGNOSIS — R921 Mammographic calcification found on diagnostic imaging of breast: Secondary | ICD-10-CM

## 2014-11-27 NOTE — Progress Notes (Signed)
Patient ID: Erica Lynch, female   DOB: Sep 20, 1961, 54 y.o.   MRN: [id number] History of Present Illness: Erica Lynch is a 54 year old white female, married in for well woman gyn exam and pap.She had mammogram about 2 weeks ago that showed possible mass with calcifications and she has F/U mammogram and [place] 3/4 in [place].She had ASCUS pap with negative HPV 11/19/13.She is a worrier,but moods are good is on Prozac.   Current Medications, Allergies, Past Medical History, Past Surgical History, Family History and Social History were reviewed in [employer] record.     Review of Systems: Patient denies any headaches, hearing loss, fatigue, blurred vision, shortness of breath, chest pain, abdominal pain, problems with bowel movements, urination, or intercourse. No joint pain or mood swings.    Physical Exam:BP 148/90 mmHg  Pulse 90  Ht 5' 4.25" (1.632 m)  Wt 190 lb 8 oz (86.41 kg)  BMI 32.44 kg/m2  LMP 10/15/2013 General:  Well developed, well nourished, no acute distress Skin:  Warm and dry Neck:  Midline trachea, normal thyroid, good ROM, no lymphadenopathy Lungs; Clear to auscultation bilaterally Breast:  No dominant palpable mass, retraction, or nipple discharge Cardiovascular: Regular rate and rhythm Abdomen:  Soft, non tender, no hepatosplenomegaly Pelvic:  External genitalia is normal in appearance, no lesions.  The vagina has decreased color, moisture and rugae. Urethra has no lesions or masses. The cervix is smooth, pap with HPV performed.  Uterus is felt to be normal size, shape, and contour.  No adnexal masses or tenderness noted.Bladder is non tender, no masses felt. Rectal: Good sphincter tone, no polyps, or hemorrhoids felt.  Hemoccult negative. Extremities/musculoskeletal:  No swelling or varicosities noted, no clubbing or cyanosis Psych:  No mood changes, alert and cooperative,seems happy   Impression: Well woman gyn exam with pap    Plan: Physical  in 1 year Mammogram  And [place] 3/4 and yearly Return in 3 months for breast exam for her peace of mind Needs fasting labs in near future,call when you to do Meds prescribed by Dr Willey Blade

## 2014-11-27 NOTE — Patient Instructions (Signed)
Physical in 1 year Breast check in 3 months Get mammogram 3/4

## 2014-11-28 LAB — CYTOLOGY - PAP

## 2014-11-29 ENCOUNTER — Ambulatory Visit
Admission: RE | Admit: 2014-11-29 | Discharge: 2014-11-29 | Disposition: A | Discharge status: Home / Self Care | Payer: BLUE CROSS/BLUE SHIELD | Source: Ambulatory Visit | Attending: Adult Health | Admitting: Adult Health

## 2014-11-29 ENCOUNTER — Telehealth: Payer: Self-pay | Admitting: Adult Health

## 2014-11-29 DIAGNOSIS — R928 Other abnormal and inconclusive findings on diagnostic imaging of breast: Secondary | ICD-10-CM

## 2014-11-29 NOTE — Telephone Encounter (Signed)
Pt aware of pap being normal with negative HPV and that breast US showed cysts and milk calcium

## 2015-02-27 ENCOUNTER — Ambulatory Visit: Payer: BLUE CROSS/BLUE SHIELD | Admitting: Adult Health

## 2015-03-04 ENCOUNTER — Ambulatory Visit (INDEPENDENT_AMBULATORY_CARE_PROVIDER_SITE_OTHER): Payer: BLUE CROSS/BLUE SHIELD | Admitting: Adult Health

## 2015-03-04 ENCOUNTER — Encounter: Payer: Self-pay | Admitting: Adult Health

## 2015-03-04 VITALS — BP 120/80 | HR 60 | Ht 64.0 in | Wt 191.0 lb

## 2015-03-04 DIAGNOSIS — Z87898 Personal history of other specified conditions: Secondary | ICD-10-CM

## 2015-03-04 NOTE — Progress Notes (Signed)
Subjective:     Patient ID: LAKOTA SCHWEPPE, female   DOB: 1961-06-19, 54 y.o.   MRN: [id number]  HPI [name] is a 54 year old in for follow up breast exam, she had abnormal mammogram in February and had to go back in March for [place] and had cluster of small cysts and milk of calcium, and she wanted exam to make sure felt OK.  Review of Systems  Patient denies any headaches, hearing loss, fatigue, blurred vision, shortness of breath, chest pain, abdominal pain, problems with bowel movements, urination, or intercourse. No joint pain or mood swings. Reviewed past medical,surgical, social and family history. Reviewed medications and allergies.     Objective:   Physical Exam BP 120/80 mmHg  Pulse 60  Ht 5\' 4"  (1.626 m)  Wt 191 lb (86.637 kg)  BMI 32.77 kg/m2  LMP 10/15/2013   Skin warm and dry,  Breasts:no dominate palpable mass, retraction or nipple discharge    Assessment:     History of breast lump    Plan:     Follow up prn Get F/U right mammogram and [place] in September

## 2015-03-04 NOTE — Patient Instructions (Signed)
Follow up prn Get mammogram and [place] in September

## 2015-05-01 ENCOUNTER — Other Ambulatory Visit: Payer: Self-pay | Admitting: Adult Health

## 2015-05-01 DIAGNOSIS — N644 Mastodynia: Secondary | ICD-10-CM

## 2015-05-01 DIAGNOSIS — R921 Mammographic calcification found on diagnostic imaging of breast: Secondary | ICD-10-CM

## 2015-05-01 DIAGNOSIS — Z09 Encounter for follow-up examination after completed treatment for conditions other than malignant neoplasm: Secondary | ICD-10-CM

## 2015-05-07 ENCOUNTER — Encounter (HOSPITAL_COMMUNITY): Payer: Self-pay | Admitting: Psychiatry

## 2015-05-07 ENCOUNTER — Ambulatory Visit (INDEPENDENT_AMBULATORY_CARE_PROVIDER_SITE_OTHER): Payer: BLUE CROSS/BLUE SHIELD | Admitting: Psychiatry

## 2015-05-07 DIAGNOSIS — F331 Major depressive disorder, recurrent, moderate: Secondary | ICD-10-CM

## 2015-05-07 NOTE — Progress Notes (Signed)
Patient:   Erica Lynch   DOB:   Dec 12, 1960  MR Number:  [id number]  Location:  [address] [place], [place] [address]  Date of Service:   Wednesday 05/07/2015  Start Time:   2:10 PM End Time:   3:05 PM  Provider/Observer:  Maurice Small, MSW, LCSW  Billing Code/Service:  [phone]  Chief Complaint:     Chief Complaint  Patient presents with  . Depression    Reason for Service:  Patient is referred for services by PCP Dr. Willey Blade due to patient experiencing symptoms of depression and anxiety. She is a returning patient to this clinician and last was seen in 2014. She initially was seen in this practice in 2012.  She states her emotions are out of control and  reports feeling angry, anxious, unhappy, and crying a lot. She says she feels as if she will never be happy again. She also  states she doesn't want to be around people. Her main stressor is her marriage. She and her husband began to experience increased problems in their marriage when son left for college 2 years ago. They have little in common now and often disagree about money and caring for thier possessions. Patient also reports discord related to her lack of interest in sex since she has been menopausal. She states being tired of picking up after her husband and adult son. She reports additional stress related to not settling father's estate. He has been deceased for 3 years. Her middle sister refuses to agree to settle the estate due to finanicial issues. Patient also had to give up her interest in her father's house and land to compensate the middle sister because sister says she didn't have an inheritance. Now patient and middle sister are estranged. 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 They have bought land adjacent to the business which results in patient being obligated to work another 10 years to pay off the loan that was used to purchase the property.   Current Status:  Patient reports  depressed mood, anxiety, angry, irritable, crying spells, difficulty staying asleep (wakes up with racing thoughts) rumination, decreased interest in activities, social withdrawal,  and panic attacks  Reliability of Information: Information gathered from patient and medical record  Behavioral Observation: Erica Lynch  presents as a 54 y.o.-year-old Right -handed Caucasian Female who appeared her stated age. Her dress was appropriate and she was well groomed. Her manners were ppropriate to the situation.  There were not any physical disabilities noted.  She displayed an appropriate level of cooperation and motivation.    Interactions:    Active   Attention:   within normal limits  Memory:   within normal limits  Visuo-spatial:   not examined  Speech (Volume):  normal  Speech:   normal pitch and normal volume  Thought Process:  Coherent and Relevant  Though Content:  Rumination  Orientation:   person, place, time/date, situation, day of week, month of year and year  Judgment:   Good  Planning:   Good  Affect:    Anxious and Depressed  Mood:    Anxious and Depressed  Insight:   Good  Intelligence:   normal  Marital Status/Living: Patient was born and raised in [place]. She is the oldest of three siblings. Parents were married. Patient describes household in childhood as normal. Patient has been married twice. First marriage ended after 7 years due to irreconcilable differences. Patient and her  current husband have been married for 26 years and have a 51 year old son. They reside in [place], [place]. Patient is [place]. Patient normally likes going to the gym, reading, and working in her yard.   Current Employment: Patient has been an Glass blower/designer for 28 years  Past Employment:    Substance Use:  No concerns of substance abuse are reported.    Education:   Associate's degree  Medical History:   Past Medical History  Diagnosis Date  . IBS (irritable bowel  syndrome)   . Anxiety   . Hyperlipidemia   . PONV (postoperative nausea and vomiting)   . Left breast mass 11/07/2013  . History of breast lump 02/19/2014  . Unspecified symptom associated with female genital organs 05/22/2014  . Hematuria 05/22/2014  . LLQ pain 05/22/2014  . Other and unspecified ovarian cyst 05/23/2014    Sexual History:   History  Sexual Activity  . Sexual Activity: Yes  . Birth Control/ Protection: Surgical    Comment: tubal    Abuse/Trauma History: Denies  Psychiatric History:  Patient reports no psychiatric hospitalizations. She has participated in outpatient therapy in this practice in 2012 and 2104 due to experiencing symptoms of depression and anxiety. Patient currently is taking Prozac, Wellbutrin, and Klonapin  as prescribed by PCP Dr. Willey Blade.  Family Med/Psych History:  Family History  Problem Relation Age of Onset  . Lung cancer Mother   . Breast cancer Mother   . Cancer Mother     breast  . Depression Mother   . Cirrhosis Father     etoh   . Anxiety disorder Father   . Alcohol abuse Father   . Depression Father   . Anesthesia problems Neg Hx   . Hypotension Neg Hx   . Malignant hyperthermia Neg Hx   . Pseudochol deficiency Neg Hx   . Diabetes Maternal Grandfather   . Heart disease Maternal Grandfather   . Depression Maternal Grandmother   . Depression Maternal Uncle   . Depression Maternal Uncle     Risk of Suicide/Violence: Patient reports no suicide attempts. She\ admits sometimes having  passive suicidal ideations wanting it to all end but no plan and no intent. She denies current suicidal ideations.  She reports she has had fleeting violent thoughts about husband but says she wouldn't harm him. She denies current homicidal ideations. She reports no self-injurious behaviors and no pattern of aggression or violence. Patient agrees to call this practice, call 911, or have someone take her to the emergency room should symptoms  worsen.  Impression/DX:  Patient presents with a long standing history of recurrent periods of depression along with anxiety. Symptoms have worsened in recent months and include  depressed mood, anxiety, angry, irritable, crying spells, difficulty staying asleep (wakes up with racing thoughts) rumination, decreased interest in activities, social withdrawal, and panic attacks. Diagnosis: Major Depressive Disorder, Recurrent, Moderate    Disposition/Plan:  Patient attends the assessment appointment today. Confidentiality and limits are discussed. The patient agrees to return for an appointment in 2 weeks for continuing assessment and treatment planning. Patient also agrees to see psychiatrist Dr. Harrington Challenger for medication evaluation. Patient agrees to call this practice, call 911, or have someone take her to the emergency room should symptoms worsen.  Diagnosis:    Axis I:  Major depressive disorder, recurrent episode, moderate      Axis II: Deferred       Axis III:   Past Medical History  Diagnosis Date  .  IBS (irritable bowel syndrome)   . Anxiety   . Hyperlipidemia   . PONV (postoperative nausea and vomiting)   . Left breast mass 11/07/2013  . History of breast lump 02/19/2014  . Unspecified symptom associated with female genital organs 05/22/2014  . Hematuria 05/22/2014  . LLQ pain 05/22/2014  . Other and unspecified ovarian cyst 05/23/2014        Axis IV:  problems with primary support group          Axis V:  51-60 moderate symptoms          Juancarlos Crescenzo, LCSW

## 2015-05-07 NOTE — Patient Instructions (Signed)
Discussed orally 

## 2015-05-23 ENCOUNTER — Ambulatory Visit (INDEPENDENT_AMBULATORY_CARE_PROVIDER_SITE_OTHER): Payer: BLUE CROSS/BLUE SHIELD | Admitting: Psychiatry

## 2015-05-23 ENCOUNTER — Encounter (HOSPITAL_COMMUNITY): Payer: Self-pay | Admitting: Psychiatry

## 2015-05-23 DIAGNOSIS — F331 Major depressive disorder, recurrent, moderate: Secondary | ICD-10-CM

## 2015-05-23 NOTE — Progress Notes (Signed)
   THERAPIST PROGRESS NOTE  Session Time: Friday 05/23/2015 10:10 AM - 11:10 AM  Participation Level: Active  Behavioral Response: Well GroomedAlertAnxious, Depressed and Irritable  Type of Therapy: Individual Therapy  Treatment Goals addressed:   1. Learn and implement conflict resolution skills to resolve interpersonal problems       2. Identify and replace thoughts and beliefs that support depression and anxiety  Interventions: CBT and Supportive  Summary: Erica Lynch is a 54 y.o. female who is referred for services by PCP Dr. Willey Blade due to patient experiencing symptoms of depression and anxiety. She is a returning patient to this clinician and last was seen in 2014. She initially was seen in this practice in 2012.  She states her emotions are out of control and  reports feeling angry, anxious, unhappy, and crying a lot. She says she feels as if she will never be happy again. She also  states she doesn't want to be around people. Her main stressor is her marriage. She and her husband began to experience increased problems in their marriage when son left for college 2 years ago. They have little in common now and often disagree about money and caring for thier possessions. Patient also reports discord related to her lack of interest in sex since she has been menopausal. She states being tired of picking up after her husband and adult son. She reports additional stress related to not settling deceased  father's estate , conflict with her middle sister, and issues at work.  Patient reports little to no change in symptoms since last session. She says she and her husband have an argued since last session. However, patient continues to experience depressed mood, anxiety, excessive worry, decreased interest in activities, and irritability. She expresses continued frustration with her husband and son as they do not pick up after themselves per her report. She reports feeling unappreciated at work and  at home. Patient admits tendency to internalize her feelings and then becoming very upset and emotional wants she has had enough. Patient also admits a pattern of thinking she has to fix other people's problems.  Suicidal/Homicidal: No  Therapist Response: Therapist works with patient to review symptoms, identify strengths and supports, develop treatment plan, examine patient's patterns in relationships, discuss connection between thought patterns/mood/behavior  Plan: Return again in 2 weeks.  Diagnosis: Axis I: Major depressive disorder, recurrent, moderate    Axis II: Deferred    BYNUM,[name], LCSW 05/23/2015

## 2015-05-23 NOTE — Patient Instructions (Signed)
Discussed orally 

## 2015-06-03 ENCOUNTER — Ambulatory Visit (HOSPITAL_COMMUNITY)
Admission: RE | Admit: 2015-06-03 | Discharge: 2015-06-03 | Disposition: A | Discharge status: Home / Self Care | Payer: BLUE CROSS/BLUE SHIELD | Source: Ambulatory Visit | Attending: Adult Health | Admitting: Adult Health

## 2015-06-03 DIAGNOSIS — N644 Mastodynia: Secondary | ICD-10-CM | POA: Insufficient documentation

## 2015-06-03 DIAGNOSIS — R921 Mammographic calcification found on diagnostic imaging of breast: Secondary | ICD-10-CM | POA: Diagnosis not present

## 2015-06-03 DIAGNOSIS — Z09 Encounter for follow-up examination after completed treatment for conditions other than malignant neoplasm: Secondary | ICD-10-CM | POA: Insufficient documentation

## 2015-06-04 ENCOUNTER — Ambulatory Visit (INDEPENDENT_AMBULATORY_CARE_PROVIDER_SITE_OTHER): Payer: BLUE CROSS/BLUE SHIELD | Admitting: Psychiatry

## 2015-06-04 ENCOUNTER — Encounter (HOSPITAL_COMMUNITY): Payer: Self-pay | Admitting: Psychiatry

## 2015-06-04 DIAGNOSIS — F331 Major depressive disorder, recurrent, moderate: Secondary | ICD-10-CM

## 2015-06-04 NOTE — Progress Notes (Signed)
    THERAPIST PROGRESS NOTE  Session Time:  Wednesday 06/04/2015 3:15 PM - 4:05 PM  Participation Level: Active  Behavioral Response: Well GroomedAlertAnxious, Depressed and Irritable  Type of Therapy: Individual Therapy  Treatment Goals addressed:   1. Learn and implement conflict resolution skills to resolve interpersonal problems       2. Identify and replace thoughts and beliefs that support depression and anxiety  Interventions: CBT and Supportive  Summary: Erica Lynch is a 54 y.o. female who is referred for services by PCP Dr. Willey Blade due to patient experiencing symptoms of depression and anxiety. She is a returning patient to this clinician and last was seen in 2014. She initially was seen in this practice in 2012.  She states her emotions are out of control and  reports feeling angry, anxious, unhappy, and crying a lot. She says she feels as if she will never be happy again. She also  states she doesn't want to be around people. Her main stressor is her marriage. She and her husband began to experience increased problems in their marriage when son left for college 2 years ago. They have little in common now and often disagree about money and caring for thier possessions. Patient also reports discord related to her lack of interest in sex since she has been menopausal. She states being tired of picking up after her husband and adult son. She reports additional stress related to not settling deceased  father's estate , conflict with her middle sister, and issues at work.  Patient reports decreased anxiety but increased symptoms of depression since last session. She reports depressed mood, poor motivation, and decreased energy. She reports staying in the bed most of the day this past Saturday and Sunday. She continues to express frustration about husband and expresses resentment that he does not pick up after self. She admits continued pattern of internalizing feelings and then being  verbally aggressive when she can't take it anymore. She expresses sadness she and husband have little communication and involvement with each other.  Suicidal/Homicidal: No  Therapist Response: Therapist works with patient to review symptoms, provide psychoeducation about depression and effects on functioning, identify ways to improve self-care and increase involvement in activity, examine patient's communication pattern with husband, identify areas patient can change, identify space patient can use as her space in her home, discuss referral to psychiatrist Dr. Harrington Challenger for medication evaluation  Plan: Return again in 2 weeks.  Diagnosis: Axis I: Major depressive disorder, recurrent, moderate    Axis II: Deferred    BYNUM,[name], LCSW 06/04/2015

## 2015-06-04 NOTE — Patient Instructions (Signed)
Discussed orally 

## 2015-06-17 ENCOUNTER — Ambulatory Visit (INDEPENDENT_AMBULATORY_CARE_PROVIDER_SITE_OTHER): Payer: BLUE CROSS/BLUE SHIELD | Admitting: Psychiatry

## 2015-06-17 ENCOUNTER — Encounter (HOSPITAL_COMMUNITY): Payer: Self-pay | Admitting: Psychiatry

## 2015-06-17 VITALS — BP 95/63 | HR 76 | Ht 64.0 in | Wt 191.0 lb

## 2015-06-17 DIAGNOSIS — F329 Major depressive disorder, single episode, unspecified: Secondary | ICD-10-CM | POA: Insufficient documentation

## 2015-06-17 DIAGNOSIS — F331 Major depressive disorder, recurrent, moderate: Secondary | ICD-10-CM | POA: Diagnosis not present

## 2015-06-17 MED ORDER — BUPROPION HCL ER (XL) 300 MG PO TB24: 300.0000 mg | ORAL_TABLET | Freq: Every day | ORAL | Status: DC

## 2015-06-17 MED ORDER — VENLAFAXINE HCL ER 150 MG PO CP24: 150.0000 mg | ORAL_CAPSULE | Freq: Every day | ORAL | Status: DC

## 2015-06-17 NOTE — Progress Notes (Signed)
Psychiatric Initial Adult Assessment   Patient Identification: Erica Lynch MRN:  [id number] Date of Evaluation:  06/17/2015 Referral Source: Maurice Small MSW Chief Complaint:   Chief Complaint    Depression; Anxiety; Establish Care     Visit Diagnosis:    ICD-9-CM ICD-10-CM   1. Major depressive disorder, recurrent episode, moderate 296.32 F33.1    Diagnosis:   Patient Active Problem List   Diagnosis Date Noted  . Major depression [F32.2] 06/17/2015  . Breast calcifications on mammogram [R92.8] 11/27/2014  . Back pain with right-sided radiculopathy [M54.10] 10/08/2014  . 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 Other and unspecified ovarian cyst [N83.20] 05/23/2014  . Unspecified symptom associated with female genital organs [N94.9] 05/22/2014  . Hematuria [R31.9] 05/22/2014  . LLQ pain [R10.32] 05/22/2014  . History of breast lump [Z87.898] 02/19/2014  . Left breast mass [N63] 11/07/2013  . Screen for colon cancer [Z12.11] 08/04/2011   History of Present Illness:  This patient is a 54 year old married white female who lives with her husband and 5 year old son in [place]. 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  The patient was originally referred by her primary care physician, Dr. Willey Blade, to see Maurice Small for counseling. She in turn has referred her to me for medication management of depression and anxiety.  The patient states that her mood problems started during the first year after she had her son 20 years ago. She felt sad down and was having crying spells. She was also having marital problems. At the time her husband was acting in an immature fashion, going out with friends a lot drinking and leaving her alone with the baby. She never had treatment for this at the time.  Over the years her depression worsened at times. Her mother died in 01-24-06 from cancer which caused her to feel worse. Her primary physician put her on Zoloft and later  Cymbalta and most recently on Prozac. Her father was an alcoholic who died in 1962 from complications of alcoholism. In 24-Jan-2013 she decided try going off Prozac but got very depressed without it and went back on. She's now on accommodation of Prozac and Wellbutrin.  Despite the 2 antidepressants the patient feels sad all the time. Her energy isn't motivation are low. She states that she is able to function at home and work but just doesn't enjoy anything. She sleeps but only when she takes clonazepam at bedtime. Her marriage is still not going well and she feels that she and her husband have little in common. He still doesn't help much around the house and would rather spend time with his mother than a dinner with her. She feels alone much of the time. She does have one close friend that she does things with. She tries to exercise 2-3 times a week. She has never been suicidal has never had psychotic symptoms or previous psychiatric treatment. Elements:  Location:  Global. Quality:  Moderate. Severity:  Moderate. Timing:  Daily. Duration:  20 years. Context:  Marital problems, loss of parents. Associated Signs/Symptoms: Depression Symptoms:  depressed mood, anhedonia, psychomotor retardation, fatigue, difficulty concentrating, anxiety, loss of energy/fatigue, (Hypo) Manic Symptoms:  Irritable Mood, Anxiety Symptoms:  Excessive Worry,   Past Medical History:  Past Medical History  Diagnosis Date  . IBS (irritable bowel syndrome)   . Anxiety   . Hyperlipidemia   . PONV (postoperative nausea and vomiting)   . Left breast mass 11/07/2013  . History of breast lump 02/19/2014  .  Unspecified symptom associated with female genital organs 05/22/2014  . Hematuria 05/22/2014  . LLQ pain 05/22/2014  . Other and unspecified ovarian cyst 05/23/2014  . Depression     Past Surgical History  Procedure Laterality Date  . Cesarean section  1996  . Other surgical history      Uterine ablation  .  Endometrial ablation  2-3 yrs ago    [facility]  . Tubal ligation  6 yrs ago    [facility]  . Colonoscopy  08/2011    Dr. Gala Romney: normal   Family History:  Family History  Problem Relation Age of Onset  . Lung cancer Mother   . Breast cancer Mother   . Cancer Mother     breast  . Depression Mother   . Cirrhosis Father     etoh   . Anxiety disorder Father   . Alcohol abuse Father   . Depression Father   . Anesthesia problems Neg Hx   . Hypotension Neg Hx   . Malignant hyperthermia Neg Hx   . Pseudochol deficiency Neg Hx   . Diabetes Maternal Grandfather   . Heart disease Maternal Grandfather   . Depression Maternal Grandmother   . Depression Maternal Uncle   . Depression Maternal Uncle    Social History:   Social History   Social History  . Marital Status: Married    Spouse Name: N/A  . Number of Children: 1  . Years of Education: N/A   Occupational History  . [employer]    Social History Main Topics  . Smoking status: Never Smoker   . Smokeless tobacco: Never Used  . Alcohol Use: Yes     Comment: glass of wine once a month sometime not at all  . Drug Use: No  . Sexual Activity: Yes    Birth Control/ Protection: Surgical     Comment: tubal   Other Topics Concern  . None   Social History Narrative   1 son-16         Additional Social History: The patient grew up in [place] with both parents. She has 2 sisters. She's never been the victim of any trauma or abuse. She has a 2 year college degree. 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 Her brother-in-law now owns the store.  Musculoskeletal: Strength & Muscle Tone: within normal limits Gait & Station: normal Patient leans: N/A  Psychiatric Specialty Exam: HPI  Review of Systems  Psychiatric/Behavioral: Positive for depression. The patient is nervous/anxious.   All other systems reviewed and are  negative.   Blood pressure 95/63, pulse 76, height 5\' 4"  (1.626 m), weight 191 lb (86.637 kg), last menstrual period 10/15/2013, SpO2 96 %.Body mass index is 32.77 kg/(m^2).  General Appearance: Casual, Neat and Well Groomed  Eye Contact:  Good  Speech:  Clear and Coherent  Volume:  Normal  Mood:  Depressed  Affect:  Constricted and Depressed  Thought Process:  Goal Directed  Orientation:  Full (Time, Place, and Person)  Thought Content:  Rumination  Suicidal Thoughts:  No  Homicidal Thoughts:  No  Memory:  Immediate;   Good Recent;   Good Remote;   Good  Judgement:  Good  Insight:  Good  Psychomotor Activity:  Normal  Concentration:  Fair  Recall:  Good  Fund of Knowledge:Good  Language: Good  Akathisia:  No  Handed:  Right  AIMS (if indicated):    Assets:  Communication Skills Desire for Improvement Physical Health Resilience Social Support Talents/Skills  ADL's:  Intact  Cognition: WNL  Sleep:  ok   Is the patient at risk to self?  No. Has the patient been a risk to self in the past 6 months?  No. Has the patient been a risk to self within the distant past?  No. Is the patient a risk to others?  No. Has the patient been a risk to others in the past 6 months?  No. Has the patient been a risk to others within the distant past?  No.  Allergies:  No Known Allergies Current Medications: Current Outpatient Prescriptions  Medication Sig Dispense Refill  . buPROPion (WELLBUTRIN XL) 300 MG 24 hr tablet Take 1 tablet (300 mg total) by mouth daily. 30 tablet 2  . clonazePAM (KLONOPIN) 1 MG tablet 1 mg at bedtime.   2  . FLUoxetine HCl 60 MG TABS 60 mg daily.   4  . ibuprofen (ADVIL,MOTRIN) 200 MG tablet Take 200 mg by mouth as needed.    . venlafaxine XR (EFFEXOR-XR) 150 MG 24 hr capsule Take 1 capsule (150 mg total) by mouth daily. 30 capsule 2   No current facility-administered medications for this visit.    Previous Psychotropic Medications: Yes   Substance Abuse  History in the last 12 months:  No.  Consequences of Substance Abuse: NA  Medical Decision Making:  Review of Psycho-Social Stressors (1), Review or order clinical lab tests (1), Review and summation of old records (2), Established Problem, Worsening (2), Review of Medication Regimen & Side Effects (2) and Review of New Medication or Change in Dosage (2)  Treatment Plan Summary: Medication management   This patient is a 54 year old female with a long history of depression. Some of this is exacerbated by the marital difficulties and stressors at work. She also has strong genetic history of major depression in her family. Her current combination of medicines is not working. I suggested we continue the Wellbutrin and switch Prozac to Effexor to help more with energy and low mood. She can also continue the clonazepam to help anxiety and sleep at bedtime. She'll continue her counseling and return to see me in 4 weeks    ROSS, [facility] 9/20/20162:52 PM

## 2015-06-18 ENCOUNTER — Ambulatory Visit (INDEPENDENT_AMBULATORY_CARE_PROVIDER_SITE_OTHER): Payer: BLUE CROSS/BLUE SHIELD | Admitting: Psychiatry

## 2015-06-18 ENCOUNTER — Encounter (HOSPITAL_COMMUNITY): Payer: Self-pay | Admitting: Psychiatry

## 2015-06-18 DIAGNOSIS — F331 Major depressive disorder, recurrent, moderate: Secondary | ICD-10-CM

## 2015-06-18 NOTE — Progress Notes (Signed)
    THERAPIST PROGRESS NOTE  Session Time:  Wednesday 06/18/2015 9:10 AM - 10:05 AM  Participation Level: Active  Behavioral Response: Well GroomedAlertAnxious, Depressed and Irritable  Type of Therapy: Individual Therapy  Treatment Goals addressed:   1. Learn and implement conflict resolution skills to resolve interpersonal problems       2. Identify and replace thoughts and beliefs that support depression and anxiety  Interventions: CBT and Supportive  Summary: Erica Lynch is a 54 y.o. female who is referred for services by PCP Dr. Willey Blade due to patient experiencing symptoms of depression and anxiety. She is a returning patient to this clinician and last was seen in 2014. She initially was seen in this practice in 2012.  She states her emotions are out of control and  reports feeling angry, anxious, unhappy, and crying a lot. She says she feels as if she will never be happy again. She also  states she doesn't want to be around people. Her main stressor is her marriage. She and her husband began to experience increased problems in their marriage when son left for college 2 years ago. They have little in common now and often disagree about money and caring for thier possessions. Patient also reports discord related to her lack of interest in sex since she has been menopausal. She states being tired of picking up after her husband and adult son. She reports additional stress related to not settling deceased  father's estate , conflict with her middle sister, and issues at work.  Patient reports continued sadness and depression since last session. She reports one of her dogs died last week. She has tried to engage in more activity using daily planning and reports this helps her feel more organized. However she continues to struggle regarding increasing physical activity and having social interaction. She reports feeling better when she is more engaged. She continues to express anger regarding  husband and has difficulty being assertive. Suicidal/Homicidal: No  Therapist Response: Therapist works with patient to review symptoms, encourage patient to continue efforts to improve self-care and increase involvement in activity, identify role of feelings and encourage patient to journal, identify blockers and helpers of effective assertion  Plan: Return again in 2 weeks. Patient agrees to use daily planning and journaling.  Diagnosis: Axis I: Major depressive disorder, recurrent, moderate    Axis II: Deferred    BYNUM,[name], LCSW 06/18/2015

## 2015-06-18 NOTE — Patient Instructions (Signed)
Discussed orally 

## 2015-07-03 ENCOUNTER — Ambulatory Visit (INDEPENDENT_AMBULATORY_CARE_PROVIDER_SITE_OTHER): Payer: BLUE CROSS/BLUE SHIELD | Admitting: Psychiatry

## 2015-07-03 ENCOUNTER — Encounter (HOSPITAL_COMMUNITY): Payer: Self-pay | Admitting: Psychiatry

## 2015-07-03 DIAGNOSIS — F331 Major depressive disorder, recurrent, moderate: Secondary | ICD-10-CM | POA: Diagnosis not present

## 2015-07-03 NOTE — Progress Notes (Signed)
    THERAPIST PROGRESS NOTE  Session Time:  Thursday 07/03/2015 1:07 PM -    1:58 PM  Participation Level: Active  Behavioral Response: Well GroomedAlertAnxious, Depressed and Irritable  Type of Therapy: Individual Therapy  Treatment Goals addressed:   1. Learn and implement conflict resolution skills to resolve interpersonal problems       2. Identify and replace thoughts and beliefs that support depression and anxiety  Interventions: CBT and Supportive  Summary: Erica Lynch is a 53 y.o. female who is referred for services by PCP Dr. Willey Blade due to patient experiencing symptoms of depression and anxiety. She is a returning patient to this clinician and last was seen in 2014. She initially was seen in this practice in 2012.  She states her emotions are out of control and  reports feeling angry, anxious, unhappy, and crying a lot. She says she feels as if she will never be happy again. She also  states she doesn't want to be around people. Her main stressor is her marriage. She and her husband began to experience increased problems in their marriage when son left for college 2 years ago. They have little in common now and often disagree about money and caring for thier possessions. Patient also reports discord related to her lack of interest in sex since she has been menopausal. She states being tired of picking up after her husband and adult son. She reports additional stress related to not settling deceased  father's estate , conflict with her middle sister, and issues at work.  Patient reports improved mood rating depression at 3/10 since last session. She reports Effexor seems to be helping. Patient reports she has been journaling and using daily planning. Patient reports becoming more aware of her behavior and thoughts since journaling.  She states feeling more in control of her time since using daily planning. She reports increased energy, improved sleep pattern, and decreased irritability.  She has improved self-care  and has been working out 3 times per week. She also has been doing more nurturing activities for self. She enjoyed recent girls weekend at the beach with family.  Suicidal/Homicidal: None  Therapist Response: Therapist works with patient to review symptoms, praise and reinforce patient's use of journaling and daily planning, identify benefits of journaling/planning and effects on mood, behavior, and thoughts, discuss non-assertive, assertive, and aggressive modes of behavior identifying thoughts, feelings, and behaviors associated with each  Plan: Return again in 2 weeks. Patient agrees to continuing using daily planning and journaling.  Diagnosis: Axis I: Major depressive disorder, recurrent, moderate    Axis II: Deferred    BYNUM,[name], LCSW 07/03/2015

## 2015-07-03 NOTE — Patient Instructions (Signed)
Discussed orally 

## 2015-07-17 ENCOUNTER — Ambulatory Visit (INDEPENDENT_AMBULATORY_CARE_PROVIDER_SITE_OTHER): Payer: BLUE CROSS/BLUE SHIELD | Admitting: Psychiatry

## 2015-07-17 DIAGNOSIS — F331 Major depressive disorder, recurrent, moderate: Secondary | ICD-10-CM | POA: Diagnosis not present

## 2015-07-17 NOTE — Progress Notes (Signed)
     THERAPIST PROGRESS NOTE  Session Time:  Thursday 10/202016 2:07 PM - 2:57 PM  Participation Level: Active  Behavioral Response: Well GroomedAlertAnxious, Depressed and Irritable  Type of Therapy: Individual Therapy  Treatment Goals addressed:   1. Learn and implement conflict resolution skills to resolve interpersonal problems       2. Identify and replace thoughts and beliefs that support depression and anxiety  Interventions: CBT and Supportive  Summary: Erica Lynch is a 54 y.o. female who is referred for services by PCP Dr. Willey Blade due to patient experiencing symptoms of depression and anxiety. She is a returning patient to this clinician and last was seen in 2014. She initially was seen in this practice in 2012.  She states her emotions are out of control and  reports feeling angry, anxious, unhappy, and crying a lot. She says she feels as if she will never be happy again. She also  states she doesn't want to be around people. Her main stressor is her marriage. She and her husband began to experience increased problems in their marriage when son left for college 2 years ago. They have little in common now and often disagree about money and caring for thier possessions. Patient also reports discord related to her lack of interest in sex since she has been menopausal. She states being tired of picking up after her husband and adult son. She reports additional stress related to not settling deceased  father's estate , conflict with her middle sister, and issues at work.  Patient reports continued improved mood since last session.  She is pleased with medication as prescribed by psychiatrist Dr. Harrington Challenger. She has continued journaling, working, and maintained positive self-care. She also has maintained social involvement. She reports less stress related to marriage but did report becoming very angry with husband about a recent incident. She admits being nonassertive initially but then becoming  aggressive in her mode of communicating with husband. She did journal regarding the incidents later that evening but states being so upset about the incident that it affected her physically.  Suicidal/Homicidal: None  Therapist Response: Therapist works with patient to review symptoms, praise and reinforce patient's use of journaling and daily planning,  discuss non-assertive, and aggressive modes of behavior in recent interaction with husband, identify assertive ways patient could have handled the situation, role play ways to be assertive in communication with husband, and identify assertiveness tips  Plan: Return again in 2 weeks. Patient agrees to continuing using daily planning and journaling.  Diagnosis: Axis I: Major depressive disorder, recurrent, moderate    Axis II: Deferred    Jazlen Ogarro, LCSW 07/17/2015

## 2015-07-17 NOTE — Patient Instructions (Signed)
Discussed orally 

## 2015-07-21 ENCOUNTER — Encounter (HOSPITAL_COMMUNITY): Payer: Self-pay | Admitting: Psychiatry

## 2015-07-21 ENCOUNTER — Ambulatory Visit (INDEPENDENT_AMBULATORY_CARE_PROVIDER_SITE_OTHER): Payer: BLUE CROSS/BLUE SHIELD | Admitting: Psychiatry

## 2015-07-21 VITALS — BP 102/71 | HR 92 | Ht 64.0 in | Wt 189.8 lb

## 2015-07-21 DIAGNOSIS — F331 Major depressive disorder, recurrent, moderate: Secondary | ICD-10-CM

## 2015-07-21 MED ORDER — BUPROPION HCL ER (XL) 300 MG PO TB24: 300.0000 mg | ORAL_TABLET | Freq: Every day | ORAL | Status: DC

## 2015-07-21 MED ORDER — CLONAZEPAM 1 MG PO TABS: 1.0000 mg | ORAL_TABLET | Freq: Two times a day (BID) | ORAL | Status: DC | PRN

## 2015-07-21 MED ORDER — VENLAFAXINE HCL ER 150 MG PO CP24: 150.0000 mg | ORAL_CAPSULE | Freq: Every day | ORAL | Status: DC

## 2015-07-21 NOTE — Progress Notes (Signed)
Patient ID: Erica Lynch, female   DOB: November 18, 1960, 54 y.o.   MRN: [id number]  Psychiatric Initial Adult Assessment   Patient Identification: [name] MRN:  [id number] Date of Evaluation:  07/21/2015 Referral Source: Maurice Small MSW Chief Complaint:   Chief Complaint    Depression; Anxiety; Follow-up     Visit Diagnosis:    ICD-9-CM ICD-10-CM   1. Major depressive disorder, recurrent episode, moderate (HCC) 296.32 F33.1    Diagnosis:   Patient Active Problem List   Diagnosis Date Noted  . Major depression ([place]) [F32.9] 06/17/2015  . Breast calcifications on mammogram [R92.8] 11/27/2014  . Back pain with right-sided radiculopathy [M54.10] 10/08/2014  . 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 Other and unspecified ovarian cyst [N83.209] 05/23/2014  . Unspecified symptom associated with female genital organs [N94.9] 05/22/2014  . Hematuria [R31.9] 05/22/2014  . LLQ pain [R10.32] 05/22/2014  . History of breast lump [Z87.898] 02/19/2014  . Left breast mass [N63] 11/07/2013  . Screen for colon cancer [Z12.11] 08/04/2011   History of Present Illness:  This patient is a 54 year old married white female who lives with her husband and 21 year old son in [place]. 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  The patient was originally referred by her primary care physician, Dr. Willey Blade, to see Maurice Small for counseling. She in turn has referred her to me for medication management of depression and anxiety.  The patient states that her mood problems started during the first year after she had her son 20 years ago. She felt sad down and was having crying spells. She was also having marital problems. At the time her husband was acting in an immature fashion, going out with friends a lot drinking and leaving her alone with the baby. She never had treatment for this at the time.  Over the years her depression worsened at times. Her mother died in December 27, 2005 from  cancer which caused her to feel worse. Her primary physician put her on Zoloft and later Cymbalta and most recently on Prozac. Her father was an alcoholic who died in 2637 from complications of alcoholism. In 2012-12-27 she decided try going off Prozac but got very depressed without it and went back on. She's now on accommodation of Prozac and Wellbutrin.  Despite the 2 antidepressants the patient feels sad all the time. Her energy isn't motivation are low. She states that she is able to function at home and work but just doesn't enjoy anything. She sleeps but only when she takes clonazepam at bedtime. Her marriage is still not going well and she feels that she and her husband have little in common. He still doesn't help much around the house and would rather spend time with his mother than a dinner with her. She feels alone much of the time. She does have one close friend that she does things with. She tries to exercise 2-3 times a week. She has never been suicidal has never had psychotic symptoms or previous psychiatric treatment.  The patient returns after 4 weeks. She states that she is doing better. Last time we switch Prozac to Effexor XR. She states that now she has more energy. She is getting out with friends and doing things. She is not waiting on her husband to suggest things to do. She is sleeping fairly well but still has some anxiety through the day and would like to have some clonazepam available to use. I suggested we increase her quantity to 60 month  so she can have some to use during the day and she agrees.   Elements:  Location:  Global. Quality:  Moderate. Severity:  Moderate. Timing:  Daily. Duration:  20 years. Context:  Marital problems, loss of parents. Associated Signs/Symptoms: Depression Symptoms:  depressed mood, anhedonia, psychomotor retardation, fatigue, difficulty concentrating, anxiety, loss of energy/fatigue, (Hypo) Manic Symptoms:  Irritable Mood, Anxiety Symptoms:   Excessive Worry,   Past Medical History:  Past Medical History  Diagnosis Date  . IBS (irritable bowel syndrome)   . Anxiety   . Hyperlipidemia   . PONV (postoperative nausea and vomiting)   . Left breast mass 11/07/2013  . History of breast lump 02/19/2014  . Unspecified symptom associated with female genital organs 05/22/2014  . Hematuria 05/22/2014  . LLQ pain 05/22/2014  . Other and unspecified ovarian cyst 05/23/2014  . Depression     Past Surgical History  Procedure Laterality Date  . Cesarean section  1996  . Other surgical history      Uterine ablation  . Endometrial ablation  2-3 yrs ago    [facility]  . Tubal ligation  6 yrs ago    [facility]  . Colonoscopy  08/2011    Dr. Gala Romney: normal   Family History:  Family History  Problem Relation Age of Onset  . Lung cancer Mother   . Breast cancer Mother   . Cancer Mother     breast  . Depression Mother   . Cirrhosis Father     etoh   . Anxiety disorder Father   . Alcohol abuse Father   . Depression Father   . Anesthesia problems Neg Hx   . Hypotension Neg Hx   . Malignant hyperthermia Neg Hx   . Pseudochol deficiency Neg Hx   . Diabetes Maternal Grandfather   . Heart disease Maternal Grandfather   . Depression Maternal Grandmother   . Depression Maternal Uncle   . Depression Maternal Uncle    Social History:   Social History   Social History  . Marital Status: Married    Spouse Name: N/A  . Number of Children: 1  . Years of Education: N/A   Occupational History  . [employer]    Social History Main Topics  . Smoking status: Never Smoker   . Smokeless tobacco: Never Used  . Alcohol Use: Yes     Comment: glass of wine once a month sometime not at all  . Drug Use: No  . Sexual Activity: Yes    Birth Control/ Protection: Surgical     Comment: tubal   Other Topics Concern  . None   Social History Narrative   1 son-16         Additional Social History: The patient grew up in [place]  with both parents. She has 2 sisters. She's never been the victim of any trauma or abuse. She has a 2 year college degree. 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 Her brother-in-law now owns the store.  Musculoskeletal: Strength & Muscle Tone: within normal limits Gait & Station: normal Patient leans: N/A  Psychiatric Specialty Exam: Depression        Past medical history includes anxiety.   Anxiety Symptoms include nervous/anxious behavior.      Review of Systems  Psychiatric/Behavioral: Positive for depression. The patient is nervous/anxious.   All other systems reviewed and are negative.  Blood pressure 102/71, pulse 92, height 5\' 4"  (1.626 m), weight 189 lb 12.8 oz (86.093 kg), last menstrual period 10/15/2013.Body mass index is 32.56 kg/(m^2).  General Appearance: Casual, Neat and Well Groomed  Eye Contact:  Good  Speech:  Clear and Coherent  Volume:  Normal  Mood:euthymic  Affect:  brighter  Thought Process:  Goal Directed  Orientation:  Full (Time, Place, and Person)  Thought Content:  Rumination  Suicidal Thoughts:  No  Homicidal Thoughts:  No  Memory:  Immediate;   Good Recent;   Good Remote;   Good  Judgement:  Good  Insight:  Good  Psychomotor Activity:  Normal  Concentration:  Fair  Recall:  Good  Fund of Knowledge:Good  Language: Good  Akathisia:  No  Handed:  Right  AIMS (if indicated):    Assets:  Communication Skills Desire for Improvement Physical Health Resilience Social Support Talents/Skills  ADL's:  Intact  Cognition: WNL  Sleep:  ok   Is the patient at risk to self?  No. Has the patient been a risk to self in the past 6 months?  No. Has the patient been a risk to self within the distant past?  No. Is the patient a risk to others?  No. Has the patient been a risk to others in the past 6 months?  No. Has the patient been a risk to others  within the distant past?  No.  Allergies:  No Known Allergies Current Medications: Current Outpatient Prescriptions  Medication Sig Dispense Refill  . buPROPion (WELLBUTRIN XL) 300 MG 24 hr tablet Take 1 tablet (300 mg total) by mouth daily. 30 tablet 2  . clonazePAM (KLONOPIN) 1 MG tablet Take 1 tablet (1 mg total) by mouth 2 (two) times daily as needed for anxiety. 60 tablet 2  . ibuprofen (ADVIL,MOTRIN) 200 MG tablet Take 200 mg by mouth as needed.    . venlafaxine XR (EFFEXOR-XR) 150 MG 24 hr capsule Take 1 capsule (150 mg total) by mouth daily. 30 capsule 2   No current facility-administered medications for this visit.    Previous Psychotropic Medications: Yes   Substance Abuse History in the last 12 months:  No.  Consequences of Substance Abuse: NA  Medical Decision Making:  Review of Psycho-Social Stressors (1), Review or order clinical lab tests (1), Review and summation of old records (2), Established Problem, Worsening (2), Review of Medication Regimen & Side Effects (2) and Review of New Medication or Change in Dosage (2)  Treatment Plan Summary: Medication management   This patient is a 54 year old female with a long history of depression. Some of this is exacerbated by the marital difficulties and stressors at work. She also has strong genetic history of major depression in her family. Her current combination of medicines is not working. I suggested we continue the Wellbutrin and Effexor  help more with energy and low mood. She can also continue the clonazepam but increase the dose to up to 1 mg twice a day to help anxiety and sleep at bedtime. She'll continue her counseling and return to see me in 2 months    Catrice Zuleta, [place] 10/24/201610:56 AM

## 2015-07-29 DIAGNOSIS — M5126 Other intervertebral disc displacement, lumbar region: Secondary | ICD-10-CM

## 2015-07-29 HISTORY — DX: Other intervertebral disc displacement, lumbar region: M51.26

## 2015-07-31 ENCOUNTER — Ambulatory Visit (HOSPITAL_COMMUNITY): Payer: Self-pay | Admitting: Psychiatry

## 2015-08-07 ENCOUNTER — Ambulatory Visit (INDEPENDENT_AMBULATORY_CARE_PROVIDER_SITE_OTHER): Payer: BLUE CROSS/BLUE SHIELD

## 2015-08-07 ENCOUNTER — Ambulatory Visit (INDEPENDENT_AMBULATORY_CARE_PROVIDER_SITE_OTHER): Payer: BLUE CROSS/BLUE SHIELD | Admitting: Orthopedic Surgery

## 2015-08-07 ENCOUNTER — Encounter: Payer: Self-pay | Admitting: Orthopedic Surgery

## 2015-08-07 VITALS — BP 106/70 | Ht 64.0 in | Wt 190.0 lb

## 2015-08-07 DIAGNOSIS — M5441 Lumbago with sciatica, right side: Secondary | ICD-10-CM

## 2015-08-07 DIAGNOSIS — M5126 Other intervertebral disc displacement, lumbar region: Secondary | ICD-10-CM | POA: Diagnosis not present

## 2015-08-07 MED ORDER — GABAPENTIN 100 MG PO CAPS: 100.0000 mg | ORAL_CAPSULE | Freq: Three times a day (TID) | ORAL | Status: DC

## 2015-08-07 MED ORDER — PREDNISONE 10 MG PO TABS: 10.0000 mg | ORAL_TABLET | Freq: Two times a day (BID) | ORAL | Status: DC

## 2015-08-07 NOTE — Patient Instructions (Signed)
Two new medications sent to your pharmacy  We will schedule MRI for you and call you with the appointment

## 2015-08-07 NOTE — Progress Notes (Signed)
Patient ID: Erica Lynch, female   DOB: 1961-02-26, 54 y.o.   MRN: [id number]  Chief Complaint  Patient presents with  . Back Pain    Low back pain    HPI Erica Lynch is a 54 y.o. female.  I saw Erica Lynch in January treated with Medrol Dosepak for degenerative changes in the lower back with some radicular symptoms  She recovered well but about 3 or 4 weeks ago she sort of had an injury where she twisted her tripped and started have some right lower back pain down and then radiation of the pain down her leg. She took hydrocodone Flexeril cut some Toradol on November 3 did not improve presents back for reevaluation with the following symptoms  Pain in the lower back radiating down the right leg. Sharp throbbing burning stabbing aching radiating pain with numbness and tingling in the great toe and stiffness and pain in the lower back. Improved with lying down and worse with sitting down getting up from sitting position. She says it feels like something chest right  No changes in the bowel bladder function she does have some depression and anxiety other review of systems already described and she has not had fever or night sweats  Medical history of depression Review of Systems Review of Systems  Past Medical History  Diagnosis Date  . IBS (irritable bowel syndrome)   . Anxiety   . Hyperlipidemia   . PONV (postoperative nausea and vomiting)   . Left breast mass 11/07/2013  . History of breast lump 02/19/2014  . Unspecified symptom associated with female genital organs 05/22/2014  . Hematuria 05/22/2014  . LLQ pain 05/22/2014  . Other and unspecified ovarian cyst 05/23/2014  . Depression     Past Surgical History  Procedure Laterality Date  . Cesarean section  1996  . Other surgical history      Uterine ablation  . Endometrial ablation  2-3 yrs ago    [facility]  . Tubal ligation  6 yrs ago    [facility]  . Colonoscopy  08/2011    Dr. Gala Romney: normal    Family History  Problem  Relation Age of Onset  . Lung cancer Mother   . Breast cancer Mother   . Cancer Mother     breast  . Depression Mother   . Cirrhosis Father     etoh   . Anxiety disorder Father   . Alcohol abuse Father   . Depression Father   . Anesthesia problems Neg Hx   . Hypotension Neg Hx   . Malignant hyperthermia Neg Hx   . Pseudochol deficiency Neg Hx   . Diabetes Maternal Grandfather   . Heart disease Maternal Grandfather   . Depression Maternal Grandmother   . Depression Maternal Uncle   . Depression Maternal Uncle     Social History Social History  Substance Use Topics  . Smoking status: Never Smoker   . Smokeless tobacco: Never Used  . Alcohol Use: Yes     Comment: glass of wine once a month sometime not at all    No Known Allergies  Current Outpatient Prescriptions  Medication Sig Dispense Refill  . buPROPion (WELLBUTRIN XL) 300 MG 24 hr tablet Take 1 tablet (300 mg total) by mouth daily. 30 tablet 2  . clonazePAM (KLONOPIN) 1 MG tablet Take 1 tablet (1 mg total) by mouth 2 (two) times daily as needed for anxiety. 60 tablet 2  . gabapentin (NEURONTIN) 100 MG capsule Take 1  capsule (100 mg total) by mouth 3 (three) times daily. 90 capsule 0  . ibuprofen (ADVIL,MOTRIN) 200 MG tablet Take 200 mg by mouth as needed.    . predniSONE (DELTASONE) 10 MG tablet Take 1 tablet (10 mg total) by mouth 2 (two) times daily with a meal. 28 tablet 0  . venlafaxine XR (EFFEXOR-XR) 150 MG 24 hr capsule Take 1 capsule (150 mg total) by mouth daily. 30 capsule 2   No current facility-administered medications for this visit.       Physical Exam Physical Exam Blood pressure 106/70, height 5\' 4"  (1.626 m), weight 190 lb (86.183 kg), last menstrual period 10/15/2013. Appearance, there are no abnormalities in terms of appearance the patient was well-developed and well-nourished. The grooming and hygiene were normal.  Mental status orientation, there was normal alertness and orientation Mood  pleasant Ambulatory status normal with no assistive devices  Examination of the lower back shows painful outpatient in the L4 S1 region Inspection no palpable defects step-offs or cyst formation scans normal.  Thoracic and cervical spine nontender Range of motion painful spinal flexion  Motor strength  bilateral lower extremity exam shows normal strength in both legs and feet Skin warm dry and intact without laceration or ulceration or erythema Neurologic examination normal sensation pinprick and palpation soft touch but positive right straight leg raise and positive Lasegue's sign Vascular examination normal pulses with warm extremity and normal capillary refill      Data Reviewed Previous imaging studies showed that the lumbar spine was degenerative and then today's films also revealed abnormal spinal contour L5-S1 degenerative changes personally interpreted and reviewed  Assessment  Herniated disc lumbar probably L4-5 or S1   Plan  Prednisone Dosepak gabapentin 100 mg 3 times a day follow-up after MRI  MRI L-spine to determine if we can put some epidural injections or fluid can continue with medical management and physical therapy

## 2015-08-13 ENCOUNTER — Telehealth (HOSPITAL_COMMUNITY): Payer: Self-pay | Admitting: *Deleted

## 2015-08-14 ENCOUNTER — Ambulatory Visit (HOSPITAL_COMMUNITY): Payer: Self-pay | Admitting: Psychiatry

## 2015-08-22 ENCOUNTER — Ambulatory Visit (HOSPITAL_COMMUNITY): Payer: BLUE CROSS/BLUE SHIELD

## 2015-08-22 ENCOUNTER — Ambulatory Visit (HOSPITAL_COMMUNITY)
Admission: RE | Admit: 2015-08-22 | Discharge: 2015-08-22 | Disposition: A | Discharge status: Home / Self Care | Payer: BLUE CROSS/BLUE SHIELD | Source: Ambulatory Visit | Attending: Orthopedic Surgery | Admitting: Orthopedic Surgery

## 2015-08-22 DIAGNOSIS — M5126 Other intervertebral disc displacement, lumbar region: Secondary | ICD-10-CM | POA: Insufficient documentation

## 2015-08-22 DIAGNOSIS — M545 Low back pain: Secondary | ICD-10-CM | POA: Insufficient documentation

## 2015-08-22 DIAGNOSIS — R200 Anesthesia of skin: Secondary | ICD-10-CM | POA: Insufficient documentation

## 2015-08-22 DIAGNOSIS — M5136 Other intervertebral disc degeneration, lumbar region: Secondary | ICD-10-CM | POA: Diagnosis not present

## 2015-08-22 DIAGNOSIS — M5127 Other intervertebral disc displacement, lumbosacral region: Secondary | ICD-10-CM | POA: Diagnosis not present

## 2015-08-26 ENCOUNTER — Ambulatory Visit (INDEPENDENT_AMBULATORY_CARE_PROVIDER_SITE_OTHER): Payer: BLUE CROSS/BLUE SHIELD | Admitting: Orthopedic Surgery

## 2015-08-26 VITALS — BP 113/81 | Ht 64.0 in | Wt 190.0 lb

## 2015-08-26 DIAGNOSIS — M5126 Other intervertebral disc displacement, lumbar region: Secondary | ICD-10-CM | POA: Diagnosis not present

## 2015-08-26 NOTE — Patient Instructions (Signed)
We will refer to [place] for epidural steroid injections in your spine

## 2015-08-26 NOTE — Progress Notes (Signed)
Chief Complaint  Patient presents with  . Follow-up    follow up MRI L spine    Previous history: Erica Lynch is a 54 y.o. female. I saw Erica Lynch in January treated with Medrol Dosepak for degenerative changes in the lower back with some radicular symptoms  She recovered well but about 3 or 4 weeks ago she sort of had an injury where she twisted her tripped and started have some right lower back pain down and then radiation of the pain down her leg. She took hydrocodone Flexeril cut some Toradol on November 3 did not improve presents back for reevaluation with the following symptoms  Pain in the lower back radiating down the right leg. Sharp throbbing burning stabbing aching radiating pain with numbness and tingling in the great toe and stiffness and pain in the lower back. Improved with lying down and worse with sitting down getting up from sitting position. She says it feels like something chest right  No changes in the bowel bladder function she does have some depression and anxiety other review of systems already described and she has not had fever or night sweats  MRI report was read as follows  IMPRESSION: Very small right posterior lateral to foraminal disc herniation at L5-S1. Definite neural compression is not established but this could cause right-sided neural irritation.  Disc bulge at L4-5. Mild facet degeneration. Mild narrowing of the lateral recesses without visible neural compression.   Electronically Signed  By: Nelson Chimes M.D.  On: 08/22/2015 14:17  Recommend L5-S1 disc injection epidural call me after second injection, report reviewed with the patient

## 2015-08-28 ENCOUNTER — Encounter (HOSPITAL_COMMUNITY): Payer: Self-pay | Admitting: Psychiatry

## 2015-08-28 ENCOUNTER — Ambulatory Visit (INDEPENDENT_AMBULATORY_CARE_PROVIDER_SITE_OTHER): Payer: BLUE CROSS/BLUE SHIELD | Admitting: Psychiatry

## 2015-08-28 DIAGNOSIS — F331 Major depressive disorder, recurrent, moderate: Secondary | ICD-10-CM

## 2015-08-28 NOTE — Progress Notes (Signed)
     THERAPIST PROGRESS NOTE  Session Time:  Thursday 08/28/2015 11:15 AM -12:00 PM          Participation Level: Active  Behavioral Response: Well GroomedAlert/Anxious  Type of Therapy: Individual Therapy  Treatment Goals addressed:   1. Learn and implement conflict resolution skills to resolve interpersonal problems       2. Identify and replace thoughts and beliefs that support depression and anxiety  Interventions: CBT and Supportive  Summary: Erica Lynch is a 54 y.o. female who is referred for services by PCP Dr. Willey Blade due to patient experiencing symptoms of depression and anxiety. She is a returning patient to this clinician and last was seen in 2014. She initially was seen in this practice in 2012.  She states her emotions are out of control and  reports feeling angry, anxious, unhappy, and crying a lot. She says she feels as if she will never be happy again. She also  states she doesn't want to be around people. Her main stressor is her marriage. She and her husband began to experience increased problems in their marriage when son left for college 2 years ago. They have little in common now and often disagree about money and caring for thier possessions. Patient also reports discord related to her lack of interest in sex since she has been menopausal. She states being tired of picking up after her husband and adult son. She reports additional stress related to not settling deceased  father's estate , conflict with her middle sister, and issues at work.  Patient reports continued improved mood since last session.  She is more involved in activities and has initiated more social involvement. She has been more assertive and has noticed decreased anger.  She has continued journaling, working, and maintained positive self-care. However,  she recently injured back while moving furniture. She now has a herniated disc and is scheduled to have injections on 09/04/2015. Patient is very anxious  about the procedure and is very critical of self for incurring the injury.  Suicidal/Homicidal: None  Therapist Response: Therapist works with patient to review symptoms, praise and reinforce patient's use of journaling and daily planning, identify, challenge, and replace negative thoughts regarding incurring the injury, discuss ways to express concerns to medical staff regarding her anxiety about the procedure and questions/ requests to staff to facilitate less anxiety, identify coping statements,   Plan: Return again in 2 weeks. Patient agrees to continuing using daily planning and journaling.  Diagnosis: Axis I: Major depressive disorder, recurrent, moderate    Axis II: Deferred    Sumedha Munnerlyn, LCSW 08/28/2015

## 2015-08-28 NOTE — Patient Instructions (Signed)
Discussed orally 

## 2015-09-03 ENCOUNTER — Telehealth: Payer: Self-pay | Admitting: *Deleted

## 2015-09-03 NOTE — Telephone Encounter (Signed)
Routing to Dr Harrison for review 

## 2015-09-03 NOTE — Telephone Encounter (Signed)
Patient called stating she is having a Epidural tomorrow and patient states she is still having a little bit of pain from the herniated disk in her back and she wants to know if this epidural benefit her, Patient said she is not having constant pain now just certain positions like if she sits to long. Please advise [phone]

## 2015-09-04 ENCOUNTER — Ambulatory Visit
Admission: RE | Admit: 2015-09-04 | Discharge: 2015-09-04 | Disposition: A | Discharge status: Home / Self Care | Payer: BLUE CROSS/BLUE SHIELD | Source: Ambulatory Visit | Attending: Orthopedic Surgery | Admitting: Orthopedic Surgery

## 2015-09-04 DIAGNOSIS — M5126 Other intervertebral disc displacement, lumbar region: Secondary | ICD-10-CM

## 2015-09-04 MED ORDER — METHYLPREDNISOLONE ACETATE 40 MG/ML INJ SUSP (RADIOLOG
120.0000 mg | Freq: Once | INTRAMUSCULAR | Status: AC
  Administered 2015-09-04: 120 mg via EPIDURAL

## 2015-09-04 MED ORDER — IOHEXOL 180 MG/ML  SOLN
1.0000 mL | Freq: Once | INTRAMUSCULAR | Status: AC | PRN
  Administered 2015-09-04: 1 mL via EPIDURAL

## 2015-09-04 NOTE — Discharge Instructions (Signed)

## 2015-09-04 NOTE — Telephone Encounter (Signed)
Yes, all coming from disc

## 2015-09-04 NOTE — Telephone Encounter (Signed)
Patient aware.

## 2015-09-11 ENCOUNTER — Ambulatory Visit (HOSPITAL_COMMUNITY): Payer: Self-pay | Admitting: Psychiatry

## 2015-09-15 ENCOUNTER — Ambulatory Visit (INDEPENDENT_AMBULATORY_CARE_PROVIDER_SITE_OTHER): Payer: BLUE CROSS/BLUE SHIELD | Admitting: Psychiatry

## 2015-09-15 ENCOUNTER — Encounter (HOSPITAL_COMMUNITY): Payer: Self-pay | Admitting: Psychiatry

## 2015-09-15 DIAGNOSIS — F331 Major depressive disorder, recurrent, moderate: Secondary | ICD-10-CM

## 2015-09-15 NOTE — Progress Notes (Signed)
     THERAPIST PROGRESS NOTE  Session Time:  Monday 09/15/2015  9:02 AM -9:58 AM                         Participation Level: Active  Behavioral Response: Well GroomedAlert/Anxious  Type of Therapy: Individual Therapy  Treatment Goals addressed:   1. Learn and implement conflict resolution skills to resolve interpersonal problems       2. Identify and replace thoughts and beliefs that support depression and anxiety  Interventions: CBT and Supportive  Summary: Erica Lynch is a 54 y.o. female who is referred for services by PCP Dr. Willey Blade due to patient experiencing symptoms of depression and anxiety. She is a returning patient to this clinician and last was seen in 2014. She initially was seen in this practice in 2012.  She states her emotions are out of control and  reports feeling angry, anxious, unhappy, and crying a lot. She says she feels as if she will never be happy again. She also  states she doesn't want to be around people. Her main stressor is her marriage. She and her husband began to experience increased problems in their marriage when son left for college 2 years ago. They have little in common now and often disagree about money and caring for thier possessions. Patient also reports discord related to her lack of interest in sex since she has been menopausal. She states being tired of picking up after her husband and adult son. She reports additional stress related to not settling deceased  father's estate , conflict with her middle sister, and issues at work.  Patient reports doing well since last session. She reports talking with medical staff about her concerns regarding back injections before the procedure was helpful and reduced anxiety. She is pleased that she has been able to resume walking at the gym.  She has continued journaling, working, and maintained positive self-care. She also has continued to successful use of  assertiveness skills and cites recent examples with  family. She has experienced increased sadness regarding the recent 9th anniversary of her mother's death in the last few days.   Suicidal/Homicidal: None   Therapist Response: Therapist works with patient to review symptoms, praise and reinforce patient's use of journaling, continued activity and use of assertiveness skills, processed grief and loss issues, identify ways to cope with grief during the holidays  Plan: Return again in 2 weeks. Patient agrees to continuing using daily planning and journaling.  Diagnosis: Axis I: Major depressive disorder, recurrent, moderate    Axis II: Deferred    Maliek Schellhorn, LCSW 09/15/2015

## 2015-09-15 NOTE — Patient Instructions (Signed)
Discussed orally 

## 2015-09-26 ENCOUNTER — Ambulatory Visit (INDEPENDENT_AMBULATORY_CARE_PROVIDER_SITE_OTHER): Payer: BLUE CROSS/BLUE SHIELD | Admitting: Psychiatry

## 2015-09-26 ENCOUNTER — Encounter (HOSPITAL_COMMUNITY): Payer: Self-pay | Admitting: Psychiatry

## 2015-09-26 VITALS — BP 126/92 | HR 101 | Ht 64.0 in | Wt 199.2 lb

## 2015-09-26 DIAGNOSIS — F331 Major depressive disorder, recurrent, moderate: Secondary | ICD-10-CM | POA: Diagnosis not present

## 2015-09-26 MED ORDER — VENLAFAXINE HCL ER 75 MG PO CP24: 75.0000 mg | ORAL_CAPSULE | Freq: Every day | ORAL | Status: DC

## 2015-09-26 MED ORDER — BUPROPION HCL ER (XL) 300 MG PO TB24: 300.0000 mg | ORAL_TABLET | Freq: Every day | ORAL | Status: DC

## 2015-09-26 NOTE — Progress Notes (Signed)
Patient ID: Erica Lynch, female   DOB: 12/13/1960, 54 y.o.   MRN: [id number] Patient ID: Erica Lynch, female   DOB: 19-Jan-1961, 54 y.o.   MRN: [id number]  Psychiatric Initial Adult Assessment   Patient Identification: Erica Lynch MRN:  [id number] Date of Evaluation:  09/26/2015 Referral Source: Maurice Small MSW Chief Complaint:   Chief Complaint    Depression; Anxiety; Follow-up     Visit Diagnosis:    ICD-9-CM ICD-10-CM   1. Major depressive disorder, recurrent episode, moderate (HCC) 296.32 F33.1    Diagnosis:   Patient Active Problem List   Diagnosis Date Noted  . Major depression ([place]) [F32.9] 06/17/2015  . Breast calcifications on mammogram [R92.8] 11/27/2014  . Back pain with right-sided radiculopathy [M54.10] 10/08/2014  . 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 Other and unspecified ovarian cyst [N83.209] 05/23/2014  . Unspecified symptom associated with female genital organs [N94.9] 05/22/2014  . Hematuria [R31.9] 05/22/2014  . LLQ pain [R10.32] 05/22/2014  . History of breast lump [Z87.898] 02/19/2014  . Left breast mass [N63] 11/07/2013  . Screen for colon cancer [Z12.11] 08/04/2011   History of Present Illness:  This patient is a 54 year old married white female who lives with her husband and 36 year old son in [place]. 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  The patient was originally referred by her primary care physician, Dr. Willey Blade, to see Maurice Small for counseling. She in turn has referred her to me for medication management of depression and anxiety.  The patient states that her mood problems started during the first year after she had her son 20 years ago. She felt sad down and was having crying spells. She was also having marital problems. At the time her husband was acting in an immature fashion, going out with friends a lot drinking and leaving her alone with the baby. She never had treatment for this at the  time.  Over the years her depression worsened at times. Her mother died in 12-30-05 from cancer which caused her to feel worse. Her primary physician put her on Zoloft and later Cymbalta and most recently on Prozac. Her father was an alcoholic who died in 0000000 from complications of alcoholism. In Dec 30, 2012 she decided try going off Prozac but got very depressed without it and went back on. She's now on accommodation of Prozac and Wellbutrin.  Despite the 2 antidepressants the patient feels sad all the time. Her energy isn't motivation are low. She states that she is able to function at home and work but just doesn't enjoy anything. She sleeps but only when she takes clonazepam at bedtime. Her marriage is still not going well and she feels that she and her husband have little in common. He still doesn't help much around the house and would rather spend time with his mother than a dinner with her. She feels alone much of the time. She does have one close friend that she does things with. She tries to exercise 2-3 times a week. She has never been suicidal has never had psychotic symptoms or previous psychiatric treatment.  The patient returns after 2 months. She continues to do well on the counseling here is really helped. She is walking more and doing more things with her family. She states that she feels very good. However she has gained 10 pounds since starting Effexor XR. She would like to go off it but it really has helped her mood so I would like her to  do this gradually and for now we will just go down from 150 mg to 75 mg. She is only taking the clonazepam at bedtime   Elements:  Location:  Global. Quality:  Moderate. Severity:  Moderate. Timing:  Daily. Duration:  20 years. Context:  Marital problems, loss of parents. Associated Signs/Symptoms: Depression Symptoms:  depressed mood, anhedonia, psychomotor retardation, fatigue, difficulty concentrating, anxiety, loss of energy/fatigue, (Hypo) Manic  Symptoms:  Irritable Mood, Anxiety Symptoms:  Excessive Worry,   Past Medical History:  Past Medical History  Diagnosis Date  . IBS (irritable bowel syndrome)   . Anxiety   . Hyperlipidemia   . PONV (postoperative nausea and vomiting)   . Left breast mass 11/07/2013  . History of breast lump 02/19/2014  . Unspecified symptom associated with female genital organs 05/22/2014  . Hematuria 05/22/2014  . LLQ pain 05/22/2014  . Other and unspecified ovarian cyst 05/23/2014  . Depression   . Lumbar herniated disc November 2016    Past Surgical History  Procedure Laterality Date  . Cesarean section  1996  . Other surgical history      Uterine ablation  . Endometrial ablation  2-3 yrs ago    [facility]  . Tubal ligation  6 yrs ago    [facility]  . Colonoscopy  08/2011    Dr. Gala Romney: normal   Family History:  Family History  Problem Relation Age of Onset  . Lung cancer Mother   . Breast cancer Mother   . Cancer Mother     breast  . Depression Mother   . Cirrhosis Father     etoh   . Anxiety disorder Father   . Alcohol abuse Father   . Depression Father   . Anesthesia problems Neg Hx   . Hypotension Neg Hx   . Malignant hyperthermia Neg Hx   . Pseudochol deficiency Neg Hx   . Diabetes Maternal Grandfather   . Heart disease Maternal Grandfather   . Depression Maternal Grandmother   . Depression Maternal Uncle   . Depression Maternal Uncle    Social History:   Social History   Social History  . Marital Status: Married    Spouse Name: N/A  . Number of Children: 1  . Years of Education: N/A   Occupational History  . [employer]    Social History Main Topics  . Smoking status: Never Smoker   . Smokeless tobacco: Never Used  . Alcohol Use: Yes     Comment: glass of wine once a month sometime not at all  . Drug Use: No  . Sexual Activity: Yes    Birth Control/ Protection: Surgical     Comment: tubal   Other Topics Concern  . None   Social History Narrative    1 son-16         Additional Social History: The patient grew up in [place] with both parents. She has 2 sisters. She's never been the victim of any trauma or abuse. She has a 2 year college degree. 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 Her brother-in-law now owns the store.  Musculoskeletal: Strength & Muscle Tone: within normal limits Gait & Station: normal Patient leans: N/A  Psychiatric Specialty Exam: Depression        Past medical history includes anxiety.   Anxiety Symptoms include nervous/anxious behavior.      Review of Systems  Psychiatric/Behavioral: Positive for depression. The patient is nervous/anxious.   All other systems reviewed and are negative.   Blood pressure 126/92, pulse 101, height 5\' 4"  (1.626 m), weight 199 lb 3.2 oz (90.357 kg), last menstrual period 10/15/2013.Body mass index is 34.18 kg/(m^2).  General Appearance: Casual, Neat and Well Groomed  Eye Contact:  Good  Speech:  Clear and Coherent  Volume:  Normal  Mood:euthymic  Affect:  brighter  Thought Process:  Goal Directed  Orientation:  Full (Time, Place, and Person)  Thought Content:  Rumination  Suicidal Thoughts:  No  Homicidal Thoughts:  No  Memory:  Immediate;   Good Recent;   Good Remote;   Good  Judgement:  Good  Insight:  Good  Psychomotor Activity:  Normal  Concentration:  Fair  Recall:  Good  Fund of Knowledge:Good  Language: Good  Akathisia:  No  Handed:  Right  AIMS (if indicated):    Assets:  Communication Skills Desire for Improvement Physical Health Resilience Social Support Talents/Skills  ADL's:  Intact  Cognition: WNL  Sleep:  ok   Is the patient at risk to self?  No. Has the patient been a risk to self in the past 6 months?  No. Has the patient been a risk to self within the distant past?  No. Is the patient a risk to others?  No. Has the patient been a  risk to others in the past 6 months?  No. Has the patient been a risk to others within the distant past?  No.  Allergies:  No Known Allergies Current Medications: Current Outpatient Prescriptions  Medication Sig Dispense Refill  . buPROPion (WELLBUTRIN XL) 300 MG 24 hr tablet Take 1 tablet (300 mg total) by mouth daily. 30 tablet 2  . clonazePAM (KLONOPIN) 1 MG tablet Take 1 tablet (1 mg total) by mouth 2 (two) times daily as needed for anxiety. 60 tablet 2  . venlafaxine XR (EFFEXOR XR) 75 MG 24 hr capsule Take 1 capsule (75 mg total) by mouth daily. 30 capsule 2   No current facility-administered medications for this visit.    Previous Psychotropic Medications: Yes   Substance Abuse History in the last 12 months:  No.  Consequences of Substance Abuse: NA  Medical Decision Making:  Review of Psycho-Social Stressors (1), Review or order clinical lab tests (1), Review and summation of old records (2), Established Problem, Worsening (2), Review of Medication Regimen & Side Effects (2) and Review of New Medication or Change in Dosage (2)  Treatment Plan Summary: Medication management   The patient will continue Wellbutrin XL 300 mg daily. At her request we'll cut down the Effexor XR to 75 mg every morning. She'll continue clonazepam as bedtime as needed for anxiety. She'll return in 2 months    [place], [facility] 12/30/20164:26 PM

## 2015-10-16 ENCOUNTER — Telehealth (HOSPITAL_COMMUNITY): Payer: Self-pay | Admitting: *Deleted

## 2015-10-16 ENCOUNTER — Encounter (HOSPITAL_COMMUNITY): Payer: Self-pay | Admitting: Psychiatry

## 2015-10-16 ENCOUNTER — Ambulatory Visit (INDEPENDENT_AMBULATORY_CARE_PROVIDER_SITE_OTHER): Payer: BLUE CROSS/BLUE SHIELD | Admitting: Psychiatry

## 2015-10-16 DIAGNOSIS — F331 Major depressive disorder, recurrent, moderate: Secondary | ICD-10-CM | POA: Diagnosis not present

## 2015-10-16 NOTE — Telephone Encounter (Signed)
Pt came to office to leave a message for Dr. Harrington Challenger. Per pt, she is will to now start cutting back on her Effexor XR so she could eventually come off of if. Per pt, she is now wanting to go from the 75 mg to the next mg. Pt number is [phone].

## 2015-10-16 NOTE — Patient Instructions (Signed)
Discussed orally 

## 2015-10-16 NOTE — Progress Notes (Signed)
      THERAPIST PROGRESS NOTE  Session Time:  Thursday 10/16/2015  2:10  PM  -  3:01 PM                           Participation Level: Active  Behavioral Response: Well GroomedAlert/Anxious  Type of Therapy: Individual Therapy  Treatment Goals addressed:   1. Learn and implement conflict resolution skills to resolve interpersonal problems       2. Identify and replace thoughts and beliefs that support depression and anxiety  Interventions: CBT and Supportive  Summary: DEDIE VLIETSTRA is a 55 y.o. female who is referred for services by PCP Dr. Willey Blade due to patient experiencing symptoms of depression and anxiety. She is a returning patient to this clinician and last was seen in 2014. She initially was seen in this practice in 2012.  She states her emotions are out of control and  reports feeling angry, anxious, unhappy, and crying a lot. She says she feels as if she will never be happy again. She also  states she doesn't want to be around people. Her main stressor is her marriage. She and her husband began to experience increased problems in their marriage when son left for college 2 years ago. They have little in common now and often disagree about money and caring for thier possessions. Patient also reports discord related to her lack of interest in sex since she has been menopausal. She states being tired of picking up after her husband and adult son. She reports additional stress related to not settling deceased  father's estate , conflict with her middle sister, and issues at work.  Patient last was seen 4 weeks ago. She reports enjoying Christmas. She had a few down periods as she had increased thoughts about deceased mother. However, she successfully used coping skills and support system.  She currently  is experiencing increased stress,irritability, and anxiety related to her job. 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 Patient  reports increased stress managing the finances due to this and expresses frustration business owners aren't managing their responsibilities in this and do not relay information to her in timely manner.    Suicidal/Homicidal: None   Therapist Response: Therapist works with patient to review symptoms, praise and reinforce patient's use of journaling and exercise, identify and verbalize feelings about work situation, problem solving and role playing ways to improve assertiveness skills in communicating concerns to business owners  Plan: Return again in 2 weeks. Patient agrees to continuing using daily planning and journaling.  Diagnosis: Axis I: Major depressive disorder, recurrent, moderate    Axis II: Deferred    Karson Chicas, LCSW 10/16/2015

## 2015-10-21 ENCOUNTER — Other Ambulatory Visit (HOSPITAL_COMMUNITY): Payer: Self-pay | Admitting: Psychiatry

## 2015-10-21 ENCOUNTER — Telehealth (HOSPITAL_COMMUNITY): Payer: Self-pay | Admitting: *Deleted

## 2015-10-21 MED ORDER — VENLAFAXINE HCL ER 37.5 MG PO CP24: ORAL_CAPSULE | ORAL | Status: DC

## 2015-10-21 NOTE — Telephone Encounter (Signed)
Called pt and spoke with pt

## 2015-10-21 NOTE — Telephone Encounter (Signed)
Pt was informed and showed understanding

## 2015-10-21 NOTE — Telephone Encounter (Signed)
patient called to follow up on message left last week 10/16/15 for Dr. Harrington Challenger

## 2015-10-21 NOTE — Telephone Encounter (Signed)
37.5 mg dose sent in

## 2015-10-31 ENCOUNTER — Encounter (HOSPITAL_COMMUNITY): Payer: Self-pay | Admitting: Psychiatry

## 2015-10-31 ENCOUNTER — Ambulatory Visit (INDEPENDENT_AMBULATORY_CARE_PROVIDER_SITE_OTHER): Payer: BLUE CROSS/BLUE SHIELD | Admitting: Psychiatry

## 2015-10-31 DIAGNOSIS — F331 Major depressive disorder, recurrent, moderate: Secondary | ICD-10-CM

## 2015-10-31 NOTE — Patient Instructions (Signed)
Discussed orally 

## 2015-10-31 NOTE — Progress Notes (Signed)
      THERAPIST PROGRESS NOTE  Session Time:  Friday 10/31/2015 1:08 PM -  1:57 PM                     Participation Level: Active  Behavioral Response: Well GroomedAlert/Anxious  Type of Therapy: Individual Therapy  Treatment Goals addressed:   1. Learn and implement conflict resolution skills to resolve interpersonal problems       2. Identify and replace thoughts and beliefs that support depression and anxiety  Interventions: CBT and Supportive  Summary: Erica Lynch is a 55 y.o. female who is referred for services by PCP Dr. Willey Blade due to patient experiencing symptoms of depression and anxiety. She is a returning patient to this clinician and last was seen in 2014. She initially was seen in this practice in 2012.  She states her emotions are out of control and  reports feeling angry, anxious, unhappy, and crying a lot. She says she feels as if she will never be happy again. She also  states she doesn't want to be around people. Her main stressor is her marriage. She and her husband began to experience increased problems in their marriage when son left for college 2 years ago. They have little in common now and often disagree about money and caring for thier possessions. Patient also reports discord related to her lack of interest in sex since she has been menopausal. She states being tired of picking up after her husband and adult son. She reports additional stress related to not settling deceased  father's estate , conflict with her middle sister, and issues at work.  Patient reports decreased anxiety and improved mood since last session. 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 She reports meeting was productive and she now has developed a system to try to alleviate some stress associated with her responsibilities. She is less worried about the job and states now being able to leave work at work. She has been  going to the gym and a painting class. She also has continued to journal which has been helpful. She reports a recent stressful incident with her son where she felt hurt and disrespected. She was able to acknowledge her feelings to self and used journaling to assist. However, she reports being passive aggressive with son after the incident.       Suicidal/Homicidal: None   Therapist Response: Therapist works with patient to review symptoms, praise and reinforce patient's use of assertiveness skills on her job, reinforce journaling and exercise, identify and verbalize feelings about situation with son, role play ways to improve assertiveness skills in the relationship with her son and others using " I Messages".  Plan: Return again in 2 weeks. Patient agrees to continuing  journaling.  Diagnosis: Axis I: Major depressive disorder, recurrent, moderate    Axis II: Deferred    Aliea Bobe, LCSW 10/31/2015

## 2015-11-14 ENCOUNTER — Ambulatory Visit (INDEPENDENT_AMBULATORY_CARE_PROVIDER_SITE_OTHER): Payer: BLUE CROSS/BLUE SHIELD | Admitting: Psychiatry

## 2015-11-14 ENCOUNTER — Encounter (HOSPITAL_COMMUNITY): Payer: Self-pay | Admitting: Psychiatry

## 2015-11-14 DIAGNOSIS — F331 Major depressive disorder, recurrent, moderate: Secondary | ICD-10-CM | POA: Diagnosis not present

## 2015-11-14 NOTE — Patient Instructions (Signed)
Discussed orally 

## 2015-11-14 NOTE — Progress Notes (Signed)
      THERAPIST PROGRESS NOTE  Session Time:  Friday 11/14/2015 1:06 PM - 1:53 PM                              Participation Level: Active  Behavioral Response: Well GroomedAlert/Anxious  Type of Therapy: Individual Therapy  Treatment Goals addressed:   1. Learn and implement conflict resolution skills to resolve interpersonal problems       2. Identify and replace thoughts and beliefs that support depression and anxiety  Interventions: CBT and Supportive  Summary: Erica Lynch is a 55 y.o. female who is referred for services by PCP Dr. Willey Blade due to patient experiencing symptoms of depression and anxiety. She is a returning patient to this clinician and last was seen in 2014. She initially was seen in this practice in 2012.  She states her emotions are out of control and  reports feeling angry, anxious, unhappy, and crying a lot. She says she feels as if she will never be happy again. She also  states she doesn't want to be around people. Her main stressor is her marriage. She and her husband began to experience increased problems in their marriage when son left for college 2 years ago. They have little in common now and often disagree about money and caring for thier possessions. Patient also reports discord related to her lack of interest in sex since she has been menopausal. She states being tired of picking up after her husband and adult son. She reports additional stress related to not settling deceased  father's estate , conflict with her middle sister, and issues at work.  Patient reports continued decreased anxiety and improved mood since last session. She had one incident which involved a meeting with her sisters in which she initially behaved aggressively after becoming upset with one of her sisters. However, she recognized changes in her thought process and physical reactions in her body. She then was able to excuse herself from the meeting and calm self. She then returned to  meeting and was able to respond assertively. She also later wrote about incident in her journal and was able to let go of the situation. She no longer experienced the intense anger and she did not revert to old patterns of negative thoughts, social withdrawal and decreased involvement in activity. Patient also was able to set and maintain boundaries regarding her responsibilities in a healthy way. i    Suicidal/Homicidal: None   Therapist Response: Therapist works with patient to review symptoms, praise and reinforce being mindful of thoughts, actions, behaviors and being able to intervene in negative patterns, praise and reinforce patient's use of assertiveness skills , discuss effects of use of coping skills on patient and her relationships, begin to discuss lapse versus relapse regarding depression, identify patient's early signs of depression  Plan: Return again in 2 weeks. Patient agrees to continuing  journaling.  Diagnosis: Axis I: Major depressive disorder, recurrent, moderate    Axis II: Deferred    Nikko Goldwire, LCSW 11/14/2015

## 2015-11-25 ENCOUNTER — Ambulatory Visit (INDEPENDENT_AMBULATORY_CARE_PROVIDER_SITE_OTHER): Payer: BLUE CROSS/BLUE SHIELD | Admitting: Psychiatry

## 2015-11-25 ENCOUNTER — Encounter (HOSPITAL_COMMUNITY): Payer: Self-pay | Admitting: Psychiatry

## 2015-11-25 VITALS — BP 110/72 | HR 95 | Ht 64.0 in | Wt 199.6 lb

## 2015-11-25 DIAGNOSIS — F331 Major depressive disorder, recurrent, moderate: Secondary | ICD-10-CM

## 2015-11-25 MED ORDER — CLONAZEPAM 1 MG PO TABS: 1.0000 mg | ORAL_TABLET | Freq: Two times a day (BID) | ORAL | Status: DC | PRN

## 2015-11-25 MED ORDER — BUPROPION HCL ER (XL) 300 MG PO TB24: 300.0000 mg | ORAL_TABLET | Freq: Every day | ORAL | Status: DC

## 2015-11-25 NOTE — Progress Notes (Signed)
Patient ID: Erica Lynch, female   DOB: December 20, 1960, 55 y.o.   MRN: [id number] Patient ID: Erica Lynch, female   DOB: 07/26/1961, 55 y.o.   MRN: [id number] Patient ID: Erica Lynch, female   DOB: 1961/06/08, 55 y.o.   MRN: [id number]  Psychiatric Initial Adult Assessment   Patient Identification: Erica Lynch MRN:  [id number] Date of Evaluation:  11/25/2015 Referral Source: Maurice Small MSW Chief Complaint:   Chief Complaint    Depression; Anxiety; Follow-up     Visit Diagnosis:    ICD-9-CM ICD-10-CM   1. Major depressive disorder, recurrent episode, moderate (HCC) 296.32 F33.1    Diagnosis:   Patient Active Problem List   Diagnosis Date Noted  . Major depression ([place]) [F32.9] 06/17/2015  . Breast calcifications on mammogram [R92.8] 11/27/2014  . Back pain with right-sided radiculopathy [M54.10] 10/08/2014  . 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 Other and unspecified ovarian cyst [N83.209] 05/23/2014  . Unspecified symptom associated with female genital organs [N94.9] 05/22/2014  . Hematuria [R31.9] 05/22/2014  . LLQ pain [R10.32] 05/22/2014  . History of breast lump [Z87.898] 02/19/2014  . Left breast mass [N63] 11/07/2013  . Screen for colon cancer [Z12.11] 08/04/2011   History of Present Illness:  This patient is a 55 year old married white female who lives [place] her husband and 56 year old son [place]. 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  The patient was originally referred by her primary care physician, Dr. Willey Blade, to see Maurice Small for counseling. She in turn has referred her to me for medication management of depression and anxiety.  The patient states that her mood problems started during the first year after she had her son 20 years ago. She felt sad down and was having crying spells. She was also having marital problems. At the time her husband was acting in an immature fashion, going out with friends a lot drinking and  leaving her alone with the baby. She never had treatment for this at the time.  Over the years her depression worsened at times. Her mother died in January 22, 2006 from cancer which caused her to feel worse. Her primary physician put her on Zoloft and later Cymbalta and most recently on Prozac. Her father was an alcoholic who died in 0000000 from complications of alcoholism. In January 22, 2013 she decided try going off Prozac but got very depressed without it and went back on. She's now on accommodation of Prozac and Wellbutrin.  Despite the 2 antidepressants the patient feels sad all the time. Her energy isn't motivation are low. She states that she is able to function at home and work but just doesn't enjoy anything. She sleeps but only when she takes clonazepam at bedtime. Her marriage is still not going well and she feels that she and her husband have little in common. He still doesn't help much around the house and would rather spend time with his mother than a dinner with her. She feels alone much of the time. She does have one close friend that she does things with. She tries to exercise 2-3 times a week. She has never been suicidal has never had psychotic symptoms or previous psychiatric treatment.  The patient returns after 2 months. She continues to do well on the counseling here is really helped. She is walking more and doing more things with her family. She states that she feels very good. However she  Had a virus last week and went off the Effexor totally.  She was down to 37.5 mg anyway.. Nevertheless she's had some withdrawal symptoms such as "head whooshing"  nausea dizziness and sweating. It is starting to get somewhat better. She doesn't want to go back on Effexor since she still doing well on the Wellbutrin and the clonazepam is helping her anxiety   Elements:  Location:  Global. Quality:  Moderate. Severity:  Moderate. Timing:  Daily. Duration:  20 years. Context:  Marital problems, loss of  parents. Associated Signs/Symptoms: Depression Symptoms:  depressed mood, anhedonia, psychomotor retardation, fatigue, difficulty concentrating, anxiety, loss of energy/fatigue, (Hypo) Manic Symptoms:  Irritable Mood, Anxiety Symptoms:  Excessive Worry,   Past Medical History:  Past Medical History  Diagnosis Date  . IBS (irritable bowel syndrome)   . Anxiety   . Hyperlipidemia   . PONV (postoperative nausea and vomiting)   . Left breast mass 11/07/2013  . History of breast lump 02/19/2014  . Unspecified symptom associated with female genital organs 05/22/2014  . Hematuria 05/22/2014  . LLQ pain 05/22/2014  . Other and unspecified ovarian cyst 05/23/2014  . Depression   . Lumbar herniated disc November 2016    Past Surgical History  Procedure Laterality Date  . Cesarean section  1996  . Other surgical history      Uterine ablation  . Endometrial ablation  2-3 yrs ago    [facility]  . Tubal ligation  6 yrs ago    [facility]  . Colonoscopy  08/2011    Dr. Gala Romney: normal   Family History:  Family History  Problem Relation Age of Onset  . Lung cancer Mother   . Breast cancer Mother   . Cancer Mother     breast  . Depression Mother   . Cirrhosis Father     etoh   . Anxiety disorder Father   . Alcohol abuse Father   . Depression Father   . Anesthesia problems Neg Hx   . Hypotension Neg Hx   . Malignant hyperthermia Neg Hx   . Pseudochol deficiency Neg Hx   . Diabetes Maternal Grandfather   . Heart disease Maternal Grandfather   . Depression Maternal Grandmother   . Depression Maternal Uncle   . Depression Maternal Uncle    Social History:   Social History   Social History  . Marital Status: Married    Spouse Name: N/A  . Number of Children: 1  . Years of Education: N/A   Occupational History  . [employer]    Social History Main Topics  . Smoking status: Never Smoker   . Smokeless tobacco: Never Used  . Alcohol Use: Yes     Comment: glass of wine  once a month sometime not at all  . Drug Use: No  . Sexual Activity: Yes    Birth Control/ Protection: Surgical     Comment: tubal   Other Topics Concern  . None   Social History Narrative   1 son-16         Additional Social History: The patient grew up in [place] with both parents. She has 2 sisters. She's never been the victim of any trauma or abuse. She has a 2 year college degree. 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 Her brother-in-law now owns the store.  Musculoskeletal: Strength & Muscle Tone: within normal limits Gait & Station: normal Patient leans: N/A  Psychiatric Specialty Exam: Depression  Past medical history includes anxiety.   Anxiety Symptoms include nervous/anxious behavior.      Review of Systems  Psychiatric/Behavioral: Positive for depression. The patient is nervous/anxious.   All other systems reviewed and are negative.   Blood pressure 110/72, pulse 95, height 5\' 4"  (1.626 m), weight 199 lb 9.6 oz (90.538 kg), last menstrual period 10/15/2013, SpO2 95 %.Body mass index is 34.24 kg/(m^2).  General Appearance: Casual, Neat and Well Groomed  Eye Contact:  Good  Speech:  Clear and Coherent  Volume:  Normal  Mood:euthymic  Affect:  brighter  Thought Process:  Goal Directed  Orientation:  Full (Time, Place, and Person)  Thought Content:  Rumination  Suicidal Thoughts:  No  Homicidal Thoughts:  No  Memory:  Immediate;   Good Recent;   Good Remote;   Good  Judgement:  Good  Insight:  Good  Psychomotor Activity:  Normal  Concentration:  Fair  Recall:  Good  Fund of Knowledge:Good  Language: Good  Akathisia:  No  Handed:  Right  AIMS (if indicated):    Assets:  Communication Skills Desire for Improvement Physical Health Resilience Social Support Talents/Skills  ADL's:  Intact  Cognition: WNL  Sleep:  ok   Is the patient at  risk to self?  No. Has the patient been a risk to self in the past 6 months?  No. Has the patient been a risk to self within the distant past?  No. Is the patient a risk to others?  No. Has the patient been a risk to others in the past 6 months?  No. Has the patient been a risk to others within the distant past?  No.  Allergies:  No Known Allergies Current Medications: Current Outpatient Prescriptions  Medication Sig Dispense Refill  . buPROPion (WELLBUTRIN XL) 300 MG 24 hr tablet Take 1 tablet (300 mg total) by mouth daily. 30 tablet 2  . clonazePAM (KLONOPIN) 1 MG tablet Take 1 tablet (1 mg total) by mouth 2 (two) times daily as needed for anxiety. 60 tablet 2   No current facility-administered medications for this visit.    Previous Psychotropic Medications: Yes   Substance Abuse History in the last 12 months:  No.  Consequences of Substance Abuse: NA  Medical Decision Making:  Review of Psycho-Social Stressors (1), Review or order clinical lab tests (1), Review and summation of old records (2), Established Problem, Worsening (2), Review of Medication Regimen & Side Effects (2) and Review of New Medication or Change in Dosage (2)  Treatment Plan Summary: Medication management   The patient will continue Wellbutrin XL 300 mg daily. She'll continue clonazepam as bedtime as needed for anxiety. She'll return in 2 months but call if the withdrawal effects from Effexor don't subside soon    [place] 2/28/20174:10 PM

## 2015-11-28 ENCOUNTER — Ambulatory Visit (HOSPITAL_COMMUNITY): Payer: Self-pay | Admitting: Psychiatry

## 2015-12-04 ENCOUNTER — Encounter: Payer: Self-pay | Admitting: Adult Health

## 2015-12-04 ENCOUNTER — Ambulatory Visit (INDEPENDENT_AMBULATORY_CARE_PROVIDER_SITE_OTHER): Payer: BLUE CROSS/BLUE SHIELD | Admitting: Adult Health

## 2015-12-04 VITALS — BP 116/90 | HR 92 | Ht 64.25 in | Wt 202.0 lb

## 2015-12-04 DIAGNOSIS — Z01419 Encounter for gynecological examination (general) (routine) without abnormal findings: Secondary | ICD-10-CM | POA: Diagnosis not present

## 2015-12-04 DIAGNOSIS — R232 Flushing: Secondary | ICD-10-CM

## 2015-12-04 DIAGNOSIS — Z1212 Encounter for screening for malignant neoplasm of rectum: Secondary | ICD-10-CM | POA: Diagnosis not present

## 2015-12-04 DIAGNOSIS — R5383 Other fatigue: Secondary | ICD-10-CM

## 2015-12-04 HISTORY — DX: Flushing: R23.2

## 2015-12-04 HISTORY — DX: Other fatigue: R53.83

## 2015-12-04 LAB — HEMOCCULT GUIAC POC 1CARD (OFFICE): Fecal Occult Blood, POC: NEGATIVE

## 2015-12-04 NOTE — Patient Instructions (Addendum)
Try luvena Physical in 1 year, pap 2018 Mammogram yearly

## 2015-12-04 NOTE — Progress Notes (Signed)
Patient ID: DEMETRIS SWISSHELM, female   DOB: 1961/06/30, 55 y.o.   MRN: [id number] History of Present Illness: [name] is a 55 year old white female, married, G1P1, in for a well woman gyn exam, she had a normal pap with negative HPV 11/27/14.She is complaining of hot flashes and being tired.She has no sex drive, it has been about 4-5 months.She sees Clinical cytogeneticist at [facility]. PCP is Dr Willey Blade.   Current Medications, Allergies, Past Medical History, Past Surgical History, Family History and Social History were reviewed in [facility] record.     Review of Systems: Patient denies any headaches, hearing loss, blurred vision, shortness of breath, chest pain, abdominal pain, problems with bowel movements, urination.  No joint pain or mood swings. See HPI for positives.   Physical Exam:BP 116/90 mmHg  Pulse 92  Ht 5' 4.25" (1.632 m)  Wt 202 lb (91.627 kg)  BMI 34.40 kg/m2  LMP 10/15/2013 General:  Well developed, well nourished, no acute distress Skin:  Warm and dry Neck:  Midline trachea, normal thyroid, good ROM, no lymphadenopathy Lungs; Clear to auscultation bilaterally Breast:  No dominant palpable mass, retraction, or nipple discharge Cardiovascular: Regular rate and rhythm Abdomen:  Soft, non tender, no hepatosplenomegaly Pelvic:  External genitalia is normal in appearance, no lesions.  The vagina has decreased color, moisture and rugae. Urethra has no lesions or masses. The cervix is smooth.  Uterus is felt to be normal size, shape, and contour.  No adnexal masses or tenderness noted.Bladder is non tender, no masses felt. Rectal: Good sphincter tone, no polyps, or hemorrhoids felt.  Hemoccult negative. Extremities/musculoskeletal:  No swelling or varicosities noted, no clubbing or cyanosis Psych:  No mood changes, alert and cooperative,seems happy Discussed could try HRT, which declines.Try using vaginal moisturizer and good lubricate with sex and have more  often.Dress in layers and keep bed room cool.Try to keep communication open.  Impression: Well woman gyn exam, no pap Hot flashes  Fatigue     Plan: Check CBC,CMP,TSH and lipids,A1c and vitamin D Physical in 1 year, can do pap next year, if desired instead of 2019 Mammogram yearly Colonoscopy per GI Try luvena and astroglide

## 2015-12-05 ENCOUNTER — Encounter: Payer: Self-pay | Admitting: Adult Health

## 2015-12-05 ENCOUNTER — Telehealth: Payer: Self-pay | Admitting: Adult Health

## 2015-12-05 ENCOUNTER — Ambulatory Visit (INDEPENDENT_AMBULATORY_CARE_PROVIDER_SITE_OTHER): Payer: BLUE CROSS/BLUE SHIELD | Admitting: Psychiatry

## 2015-12-05 ENCOUNTER — Encounter (HOSPITAL_COMMUNITY): Payer: Self-pay | Admitting: Psychiatry

## 2015-12-05 DIAGNOSIS — F331 Major depressive disorder, recurrent, moderate: Secondary | ICD-10-CM

## 2015-12-05 DIAGNOSIS — E559 Vitamin D deficiency, unspecified: Secondary | ICD-10-CM

## 2015-12-05 HISTORY — DX: Vitamin D deficiency, unspecified: E55.9

## 2015-12-05 LAB — CBC
HEMATOCRIT: 41.6 % (ref 34.0–46.6)
Hemoglobin: 14.2 g/dL (ref 11.1–15.9)
MCH: 31.1 pg (ref 26.6–33.0)
MCHC: 34.1 g/dL (ref 31.5–35.7)
MCV: 91 fL (ref 79–97)
PLATELETS: 352 10*3/uL (ref 150–379)
RBC: 4.57 x10E6/uL (ref 3.77–5.28)
RDW: 12.4 % (ref 12.3–15.4)
WBC: 6.6 10*3/uL (ref 3.4–10.8)

## 2015-12-05 LAB — COMPREHENSIVE METABOLIC PANEL
A/G RATIO: 2 (ref 1.1–2.5)
ALBUMIN: 4.4 g/dL (ref 3.5–5.5)
ALT: 30 IU/L (ref 0–32)
AST: 21 IU/L (ref 0–40)
Alkaline Phosphatase: 66 IU/L (ref 39–117)
BUN/Creatinine Ratio: 18 (ref 9–23)
BUN: 17 mg/dL (ref 6–24)
Bilirubin Total: 0.2 mg/dL (ref 0.0–1.2)
CALCIUM: 9.4 mg/dL (ref 8.7–10.2)
CO2: 24 mmol/L (ref 18–29)
Chloride: 101 mmol/L (ref 96–106)
Creatinine, Ser: 0.92 mg/dL (ref 0.57–1.00)
GFR, EST AFRICAN AMERICAN: 81 mL/min/{1.73_m2} (ref >59)
GFR, EST NON AFRICAN AMERICAN: 70 mL/min/{1.73_m2} (ref >59)
GLOBULIN, TOTAL: 2.2 g/dL (ref 1.5–4.5)
Glucose: 97 mg/dL (ref 65–99)
POTASSIUM: 4.7 mmol/L (ref 3.5–5.2)
Sodium: 141 mmol/L (ref 134–144)
TOTAL PROTEIN: 6.6 g/dL (ref 6.0–8.5)

## 2015-12-05 LAB — LIPID PANEL
CHOL/HDL RATIO: 3.8 ratio (ref 0.0–4.4)
Cholesterol, Total: 207 mg/dL — ABNORMAL HIGH (ref 100–199)
HDL: 54 mg/dL (ref >39)
LDL Calculated: 134 mg/dL — ABNORMAL HIGH (ref 0–99)
TRIGLYCERIDES: 94 mg/dL (ref 0–149)
VLDL Cholesterol Cal: 19 mg/dL (ref 5–40)

## 2015-12-05 LAB — TSH: TSH: 3.04 u[IU]/mL (ref 0.450–4.500)

## 2015-12-05 LAB — HEMOGLOBIN A1C
Est. average glucose Bld gHb Est-mCnc: 120 mg/dL
Hgb A1c MFr Bld: 5.8 % — ABNORMAL HIGH (ref 4.8–5.6)

## 2015-12-05 LAB — VITAMIN D PNL(25-HYDRXY+1,25-DIHY)-BLD
VIT D 1 25 DIHYDROXY: 25 pg/mL (ref 19.9–79.3)
Vit D, 25-Hydroxy: 17.9 ng/mL — ABNORMAL LOW (ref 30.0–100.0)

## 2015-12-05 MED ORDER — CHOLECALCIFEROL 125 MCG (5000 UT) PO CAPS: 5000.0000 [IU] | ORAL_CAPSULE | Freq: Every day | ORAL | Status: DC

## 2015-12-05 NOTE — Patient Instructions (Signed)
Discussed orally 

## 2015-12-05 NOTE — Telephone Encounter (Signed)
Pt aware of labs, take 5000 IU vitamin D3 every day, watch fats and carbs and increase walking

## 2015-12-05 NOTE — Progress Notes (Signed)
      THERAPIST PROGRESS NOTE  Session Time:  Friday 12/05/2015 11:00 AM - 12:03 PM                                 Participation Level: Active  Behavioral Response: Well GroomedAlert/Anxious  Type of Therapy: Individual Therapy  Treatment Goals addressed:   1. Learn and implement conflict resolution skills to resolve interpersonal problems       2. Identify and replace thoughts and beliefs that support depression and anxiety  Interventions: CBT and Supportive  Summary: Erica Lynch is a 55 y.o. female who is referred for services by PCP Dr. Willey Blade due to patient experiencing symptoms of depression and anxiety. She is a returning patient to this clinician and last was seen in 2014. She initially was seen in this practice in 2012.  She states her emotions are out of control and  reports feeling angry, anxious, unhappy, and crying a lot. She says she feels as if she will never be happy again. She also  states she doesn't want to be around people. Her main stressor is her marriage. She and her husband began to experience increased problems in their marriage when son left for college 2 years ago. They have little in common now and often disagree about money and caring for thier possessions. Patient also reports discord related to her lack of interest in sex since she has been menopausal. She states being tired of picking up after her husband and adult son. She reports additional stress related to not settling deceased  father's estate , conflict with her middle sister, and issues at work.  Patient returns after 3 weeks. She reports discontinuing taking Effexor when she became sick with a virus and forgot to take her medication. She resumed taking Wellbutrin but did not resume taking the Effexor. She has discussed this with psychiatrist Dr. Harrington Challenger. Patient reports initially having some difficulty including headaches regarding discontinuing the Effexor. However, she reports feeling much better now and  having more mental clarity. She reports improved interaction with her husband and is excited about her husband planning to take her to a surprise destination for a weekend trip next week. Patient has continued to journal which has been helpful in expressing her feelings and thoughts. She has been more mindful of her thoughts and has been able to respond assertively rather than aggressively in managing interpersonal conflicts. She continues to exercise and maintain social involvement with family and friends.   Suicidal/Homicidal: None   Therapist Response: Therapist works with patient to review symptoms, discuss the impact of her use of assertiveness skills on self and her relationships, praise and reinforce being mindful of thoughts, actions, review treatment plan, discuss the relationship between distorted thinking and negative emotions, began to identify types of distorted thinking and patient's on experience with distorted thinking by introducing and discussing handout on cognitive distortions,  Plan: Return again in 3 weeks. Patient agrees to continue journaling, review handout provided in session, complete cognitive distortions worksheet provided and bring to next session.  Diagnosis: Axis I: Major depressive disorder, recurrent, moderate    Axis II: Deferred    Pedro Oldenburg, LCSW 12/05/2015

## 2015-12-12 ENCOUNTER — Ambulatory Visit (HOSPITAL_COMMUNITY): Payer: Self-pay | Admitting: Psychiatry

## 2015-12-26 ENCOUNTER — Encounter (HOSPITAL_COMMUNITY): Payer: Self-pay | Admitting: Psychiatry

## 2015-12-26 ENCOUNTER — Ambulatory Visit (INDEPENDENT_AMBULATORY_CARE_PROVIDER_SITE_OTHER): Payer: BLUE CROSS/BLUE SHIELD | Admitting: Psychiatry

## 2015-12-26 DIAGNOSIS — F331 Major depressive disorder, recurrent, moderate: Secondary | ICD-10-CM

## 2015-12-26 NOTE — Progress Notes (Signed)
      THERAPIST PROGRESS NOTE  Session Time:  Friday 12/26/2015 3:55 PM - 4:54 PM                                    Participation Level: Active  Behavioral Response: Well GroomedAlert/Anxious  Type of Therapy: Individual Therapy  Treatment Goals addressed:   1. Learn and implement conflict resolution skills to resolve interpersonal problems       2. Identify and replace thoughts and beliefs that support depression and anxiety  Interventions: CBT and Supportive  Summary: Erica Lynch is a 55 y.o. female who is referred for services by PCP Dr. Willey Blade due to patient experiencing symptoms of depression and anxiety. She is a returning patient to this clinician and last was seen in 2014. She initially was seen in this practice in 2012.  She states her emotions are out of control and  reports feeling angry, anxious, unhappy, and crying a lot. She says she feels as if she will never be happy again. She also  states she doesn't want to be around people. Her main stressor is her marriage. She and her husband began to experience increased problems in their marriage when son left for college 2 years ago. They have little in common now and often disagree about money and caring for thier possessions. Patient also reports discord related to her lack of interest in sex since she has been menopausal. She states being tired of picking up after her husband and adult son. She reports additional stress related to not settling deceased  father's estate , conflict with her middle sister, and issues at work.  Patient reports no depression since last session but increased irritability. She attributes this to symptoms related to menopause. She expresses frustration regarding having hot flashes and effects on daily functioning and interaction. She is aware of hormone replacement therapy but does not want to pursue this as she has a family history of cancer. She says she thinks the Effexor was helpful regarding some of  the symptoms related to menopause but says she does not want to resume this. She says she sometimes starts to panic when she is having a panic attack if she is somewhere she may not readily be able to go outside but calms self down. She reports enjoying weekend trip with husband..She continues to exercise and maintain social involvement with family and friends.   Suicidal/Homicidal: None   Therapist Response: Therapist works with patient to review symptoms, facilitate expression of feelings, discuss strategies and ways to manage hot flashes with as little discomfort as possible, identify thought patterns about having hot flashes and effects on mood and behavior, identify coping statements,discuss rationale for and practice mindfulness exercise using breath awareness  Plan: Return again in 3 weeks.  Diagnosis: Axis I: Major depressive disorder, recurrent, moderate    Axis II: Deferred    Dejanee Thibeaux, LCSW 12/26/2015      Jayke Caul, LCSW 12/26/2015

## 2015-12-26 NOTE — Patient Instructions (Signed)
Discussed orally 

## 2016-01-06 DIAGNOSIS — F3340 Major depressive disorder, recurrent, in remission, unspecified: Secondary | ICD-10-CM | POA: Diagnosis not present

## 2016-01-06 DIAGNOSIS — Z780 Asymptomatic menopausal state: Secondary | ICD-10-CM | POA: Diagnosis not present

## 2016-01-21 ENCOUNTER — Ambulatory Visit (INDEPENDENT_AMBULATORY_CARE_PROVIDER_SITE_OTHER): Payer: BLUE CROSS/BLUE SHIELD | Admitting: Psychiatry

## 2016-01-21 ENCOUNTER — Encounter (HOSPITAL_COMMUNITY): Payer: Self-pay | Admitting: Psychiatry

## 2016-01-21 DIAGNOSIS — F331 Major depressive disorder, recurrent, moderate: Secondary | ICD-10-CM | POA: Diagnosis not present

## 2016-01-21 NOTE — Patient Instructions (Signed)
Discussed orally 

## 2016-01-21 NOTE — Progress Notes (Signed)
       THERAPIST PROGRESS NOTE  Session Time:  Wednesday 01/21/2016 9:10 AM -9:58 AM                                         Participation Level: Active  Behavioral Response: Well GroomedAlert/Anxious  Type of Therapy: Individual Therapy  Treatment Goals addressed:   1. Learn and implement conflict resolution skills to resolve interpersonal problems       2. Identify and replace thoughts and beliefs that support depression and anxiety  Interventions: CBT and Supportive  Summary: Erica Lynch is a 55 y.o. female who is referred for services by PCP Dr. Willey Blade due to patient experiencing symptoms of depression and anxiety. She is a returning patient to this clinician and last was seen in 2014. She initially was seen in this practice in 2012.  She states her emotions are out of control and  reports feeling angry, anxious, unhappy, and crying a lot. She says she feels as if she will never be happy again. She also  states she doesn't want to be around people. Her main stressor is her marriage. She and her husband began to experience increased problems in their marriage when son left for college 2 years ago. They have little in common now and often disagree about money and caring for thier possessions. Patient also reports discord related to her lack of interest in sex since she has been menopausal. She states being tired of picking up after her husband and adult son. She reports additional stress related to not settling deceased  father's estate , conflict with her middle sister, and issues at work.  Patient reports experiencing increased symptoms related to menopause including rage, irritability, and intense hot flashes since last session. She also experienced severe sleep difficulty waking up sometimes 9-10 times per night with hot flashes. She discussed with PCP and was prescribed HRT. She has been taking for about 3 weeks and reports starting to experience decreased symptoms in the past 3 days.  She also reports increased negative thought patterns since last session. She has continued to journal and maintain healthy eating patterns as well as regular exercise pattern. He also has been using controlled breathing technique.   Suicidal/Homicidal: None   Therapist Response: Reviewed symptoms, facilitated expression of feelings, praise and reinforced patient's efforts regarding self-care, processed  journal material, assisted patient identify, challenge, and replace cognitive distortions, reviewed relaxation techniques, assisted patient identify realistic expectations of self, assisted patient identify ways to nurture self    Plan: Return again in 2 weeks.  Diagnosis: Axis I: Major depressive disorder, recurrent, moderate    Axis II: Deferred    Phillip Sandler, LCSW 01/21/2016

## 2016-01-23 ENCOUNTER — Encounter (HOSPITAL_COMMUNITY): Payer: Self-pay | Admitting: Psychiatry

## 2016-01-23 ENCOUNTER — Ambulatory Visit (INDEPENDENT_AMBULATORY_CARE_PROVIDER_SITE_OTHER): Payer: BLUE CROSS/BLUE SHIELD | Admitting: Psychiatry

## 2016-01-23 VITALS — BP 130/60 | HR 80 | Ht 64.0 in | Wt 195.2 lb

## 2016-01-23 DIAGNOSIS — F331 Major depressive disorder, recurrent, moderate: Secondary | ICD-10-CM | POA: Diagnosis not present

## 2016-01-23 MED ORDER — CLONAZEPAM 1 MG PO TABS: 1.0000 mg | ORAL_TABLET | Freq: Two times a day (BID) | ORAL | Status: DC | PRN

## 2016-01-23 MED ORDER — BUPROPION HCL ER (XL) 300 MG PO TB24: 300.0000 mg | ORAL_TABLET | Freq: Every day | ORAL | Status: DC

## 2016-01-23 NOTE — Progress Notes (Signed)
Patient ID: Erica Lynch, female   DOB: 26-Jan-1961, 55 y.o.   MRN: [id number] Patient ID: Erica Lynch, female   DOB: [birth year], 55 y.o.   MRN: [id number] Patient ID: Erica Lynch, female   DOB: 02-Dec-1960, 55 y.o.   MRN: [id number] Patient ID: Erica Lynch, female   DOB: 1961-03-16, 55 y.o.   MRN: [id number]  Psychiatric Initial Adult Assessment   Patient Identification: Erica Lynch MRN:  [id number] Date of Evaluation:  01/23/2016 Referral Source: Maurice Small MSW Chief Complaint:   Chief Complaint    Depression; Anxiety; Follow-up     Visit Diagnosis:    ICD-9-CM ICD-10-CM   1. Major depressive disorder, recurrent episode, moderate (HCC) 296.32 F33.1    Diagnosis:   Patient Active Problem List   Diagnosis Date Noted  . Vitamin D deficiency [E55.9] 12/05/2015  . Hot flashes [N95.1] 12/04/2015  . Fatigue [R53.83] 12/04/2015  . Major depression ([place]) [F32.9] 06/17/2015  . Breast calcifications on mammogram [R92.8] 11/27/2014  . Back pain with right-sided radiculopathy [M54.10] 10/08/2014  . 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 Other and unspecified ovarian cyst [N83.209] 05/23/2014  . Unspecified symptom associated with female genital organs [N94.9] 05/22/2014  . Hematuria [R31.9] 05/22/2014  . LLQ pain [R10.32] 05/22/2014  . History of breast lump [Z87.898] 02/19/2014  . Left breast mass [N63] 11/07/2013  . Screen for colon cancer [Z12.11] 08/04/2011   History of Present Illness:  This patient is a 55 year old married white female who lives with her husband and 82 year old son in [place]. 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  The patient was originally referred by her primary care physician, Dr. Willey Blade, to see Maurice Small for counseling. She in turn has referred her to me for medication management of depression and anxiety.  The patient states that her mood problems started during the first year after she had her son 20 years ago.  She felt sad down and was having crying spells. She was also having marital problems. At the time her husband was acting in an immature fashion, going out with friends a lot drinking and leaving her alone with the baby. She never had treatment for this at the time.  Over the years her depression worsened at times. Her mother died in 01/19/2006 from cancer which caused her to feel worse. Her primary physician put her on Zoloft and later Cymbalta and most recently on Prozac. Her father was an alcoholic who died in 0000000 from complications of alcoholism. In 01-19-13 she decided try going off Prozac but got very depressed without it and went back on. She's now on accommodation of Prozac and Wellbutrin.  Despite the 2 antidepressants the patient feels sad all the time. Her energy isn't motivation are low. She states that she is able to function at home and work but just doesn't enjoy anything. She sleeps but only when she takes clonazepam at bedtime. Her marriage is still not going well and she feels that she and her husband have little in common. He still doesn't help much around the house and would rather spend time with his mother than a dinner with her. She feels alone much of the time. She does have one close friend that she does things with. She tries to exercise 2-3 times a week. She has never been suicidal has never had psychotic symptoms or previous psychiatric treatment.  The patient returns after 3 months. For the most part she is feeling better. Her primary  physician put her on Premarin and progesterone for her hot flashes of few weeks ago and they have diminished greatly. She states they were "controlling my life." She is having them both day and night and her sleep is very disrupted. She is sleeping better now and is not needing to use the Klonopin very often. She's staying very busy at work and her energy has been good. She denies serious symptoms of depression   Elements:  Location:  Global. Quality:   Moderate. Severity:  Moderate. Timing:  Daily. Duration:  20 years. Context:  Marital problems, loss of parents. Associated Signs/Symptoms: Depression Symptoms:  depressed mood, anhedonia, psychomotor retardation, fatigue, difficulty concentrating, anxiety, loss of energy/fatigue, (Hypo) Manic Symptoms:  Irritable Mood, Anxiety Symptoms:  Excessive Worry,   Past Medical History:  Past Medical History  Diagnosis Date  . IBS (irritable bowel syndrome)   . Anxiety   . Hyperlipidemia   . PONV (postoperative nausea and vomiting)   . Left breast mass 11/07/2013  . History of breast lump 02/19/2014  . Unspecified symptom associated with female genital organs 05/22/2014  . Hematuria 05/22/2014  . LLQ pain 05/22/2014  . Other and unspecified ovarian cyst 05/23/2014  . Depression   . Lumbar herniated disc November 2016  . Hot flashes 12/04/2015  . Fatigue 12/04/2015  . Vitamin D deficiency 12/05/2015    Past Surgical History  Procedure Laterality Date  . Cesarean section  1996  . Other surgical history      Uterine ablation  . Endometrial ablation  2-3 yrs ago    [facility]  . Tubal ligation  6 yrs ago    [facility]  . Colonoscopy  08/2011    Dr. Gala Romney: normal   Family History:  Family History  Problem Relation Age of Onset  . Lung cancer Mother   . Breast cancer Mother   . Cancer Mother     breast  . Depression Mother   . Cirrhosis Father     etoh   . Anxiety disorder Father   . Alcohol abuse Father   . Depression Father   . Anesthesia problems Neg Hx   . Hypotension Neg Hx   . Malignant hyperthermia Neg Hx   . Pseudochol deficiency Neg Hx   . Diabetes Maternal Grandfather   . Heart disease Maternal Grandfather   . Depression Maternal Grandmother   . Depression Maternal Uncle   . Depression Maternal Uncle    Social History:   Social History   Social History  . Marital Status: Married    Spouse Name: N/A  . Number of Children: 1  . Years of Education: N/A    Occupational History  . [employer]    Social History Main Topics  . Smoking status: Never Smoker   . Smokeless tobacco: Never Used  . Alcohol Use: Yes     Comment: glass of wine once a month sometime not at all  . Drug Use: No  . Sexual Activity: Yes    Birth Control/ Protection: Surgical     Comment: tubal   Other Topics Concern  . None   Social History Narrative   1 son-16         Additional Social History: The patient grew up in [place] with both parents. She has 2 sisters. She's never been the victim of any trauma or abuse. She has a 2 year college degree. 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 Her father owned a [employer] and asked  her to join and she has stayed there ever since. Her brother-in-law now owns the store.  Musculoskeletal: Strength & Muscle Tone: within normal limits Gait & Station: normal Patient leans: N/A  Psychiatric Specialty Exam: Depression        Past medical history includes anxiety.   Anxiety Symptoms include nervous/anxious behavior.      Review of Systems  Psychiatric/Behavioral: Positive for depression. The patient is nervous/anxious.   All other systems reviewed and are negative.   Blood pressure 130/60, pulse 80, height 5\' 4"  (1.626 m), weight 195 lb 3.2 oz (88.542 kg), last menstrual period 10/15/2013.Body mass index is 33.49 kg/(m^2).  General Appearance: Casual, Neat and Well Groomed  Eye Contact:  Good  Speech:  Clear and Coherent  Volume:  Normal  Mood:euthymic  Affect:  bright  Thought Process:  Goal Directed  Orientation:  Full (Time, Place, and Person)  Thought Content:  Rumination  Suicidal Thoughts:  No  Homicidal Thoughts:  No  Memory:  Immediate;   Good Recent;   Good Remote;   Good  Judgement:  Good  Insight:  Good  Psychomotor Activity:  Normal  Concentration:  Fair  Recall:  Good  Fund of Knowledge:Good  Language: Good  Akathisia:  No  Handed:  Right  AIMS (if indicated):     Assets:  Communication Skills Desire for Improvement Physical Health Resilience Social Support Talents/Skills  ADL's:  Intact  Cognition: WNL  Sleep:  ok   Is the patient at risk to self?  No. Has the patient been a risk to self in the past 6 months?  No. Has the patient been a risk to self within the distant past?  No. Is the patient a risk to others?  No. Has the patient been a risk to others in the past 6 months?  No. Has the patient been a risk to others within the distant past?  No.  Allergies:  No Known Allergies Current Medications: Current Outpatient Prescriptions  Medication Sig Dispense Refill  . buPROPion (WELLBUTRIN XL) 300 MG 24 hr tablet Take 1 tablet (300 mg total) by mouth daily. 30 tablet 2  . Cholecalciferol 5000 units capsule Take 1 capsule (5,000 Units total) by mouth daily.    . clonazePAM (KLONOPIN) 1 MG tablet Take 1 tablet (1 mg total) by mouth 2 (two) times daily as needed for anxiety. 60 tablet 2  . estrogens, conjugated, (PREMARIN) 0.625 MG tablet Take 0.625 mg by mouth daily. Take daily for 21 days then do not take for 7 days.    . progesterone (PROMETRIUM) 200 MG capsule Take 200 mg by mouth daily.     No current facility-administered medications for this visit.    Previous Psychotropic Medications: Yes   Substance Abuse History in the last 12 months:  No.  Consequences of Substance Abuse: NA  Medical Decision Making:  Review of Psycho-Social Stressors (1), Review or order clinical lab tests (1), Review and summation of old records (2), Established Problem, Worsening (2), Review of Medication Regimen & Side Effects (2) and Review of New Medication or Change in Dosage (2)  Treatment Plan Summary: Medication management   The patient will continue Wellbutrin XL 300 mg daily. She'll continue clonazepam as bedtime as needed for anxiety. She'll return in 3 months    [facility], [facility] 4/28/201710:21 AM

## 2016-01-27 ENCOUNTER — Ambulatory Visit (HOSPITAL_COMMUNITY): Payer: Self-pay | Admitting: Psychiatry

## 2016-02-04 ENCOUNTER — Other Ambulatory Visit: Payer: Self-pay | Admitting: Physician Assistant

## 2016-02-04 DIAGNOSIS — L570 Actinic keratosis: Secondary | ICD-10-CM | POA: Diagnosis not present

## 2016-02-04 DIAGNOSIS — D485 Neoplasm of uncertain behavior of skin: Secondary | ICD-10-CM | POA: Diagnosis not present

## 2016-02-05 DIAGNOSIS — S9032XA Contusion of left foot, initial encounter: Secondary | ICD-10-CM | POA: Diagnosis not present

## 2016-02-13 ENCOUNTER — Ambulatory Visit (HOSPITAL_COMMUNITY): Payer: Self-pay | Admitting: Psychiatry

## 2016-02-24 ENCOUNTER — Ambulatory Visit (HOSPITAL_COMMUNITY): Payer: BLUE CROSS/BLUE SHIELD | Admitting: Psychology

## 2016-02-24 ENCOUNTER — Ambulatory Visit (INDEPENDENT_AMBULATORY_CARE_PROVIDER_SITE_OTHER): Payer: BLUE CROSS/BLUE SHIELD | Admitting: Psychiatry

## 2016-02-24 ENCOUNTER — Encounter (HOSPITAL_COMMUNITY): Payer: Self-pay | Admitting: Psychiatry

## 2016-02-24 DIAGNOSIS — F331 Major depressive disorder, recurrent, moderate: Secondary | ICD-10-CM | POA: Diagnosis not present

## 2016-02-24 NOTE — Progress Notes (Signed)
        THERAPIST PROGRESS NOTE  Session Time:  Tuesday 02/24/2016 9:14:AM - 9:55 AM                                        Participation Level: Active  Behavioral Response: Well GroomedAlert/Anxious  Type of Therapy: Individual Therapy  Treatment Goals addressed:   1. Learn and implement conflict resolution skills to resolve interpersonal problems       2. Identify and replace thoughts and beliefs that support depression and anxiety  Interventions: CBT and Supportive  Summary: Erica Lynch is a 55 y.o. female who is referred for services by PCP Dr. Willey Blade due to patient experiencing symptoms of depression and anxiety. She is a returning patient to this clinician and last was seen in 2014. She initially was seen in this practice in 2012.  She states her emotions are out of control and  reports feeling angry, anxious, unhappy, and crying a lot. She says she feels as if she will never be happy again. She also  states she doesn't want to be around people. Her main stressor is her marriage. She and her husband began to experience increased problems in their marriage when son left for college 2 years ago. They have little in common now and often disagree about money and caring for thier possessions. Patient also reports discord related to her lack of interest in sex since she has been menopausal. She states being tired of picking up after her husband and adult son. She reports additional stress related to not settling deceased  father's estate , conflict with her middle sister, and issues at work.  Patient reports decreased symptoms related to menopause including rage, irritability, and intense hot flashes since last session. She  Is experiencing improved sleep pattern now waking up only 2-3 times per night with hot flashes. She has experienced increased sadness regarding issues from past and has been very critical and negative about self. Patient has continued to journal and maintain healthy  eating patterns as well as regular exercise pattern. She also has been using controlled breathing technique.   Suicidal/Homicidal: None   Therapist Response: Reviewed symptoms, facilitated expression of feelings, praised and reinforced patient's efforts regarding self-care, assisted patient identify, challenge, and replace cognitive distortions, reviewed relaxation techniques, assisted patient identify realistic expectations of self, assisted patient identify ways to nurture self and use her spirituality   Plan: Return again in 2 weeks.  Diagnosis: Axis I: Major depressive disorder, recurrent, moderate    Axis II: Deferred    Davanee Klinkner, LCSW 02/24/2016

## 2016-02-24 NOTE — Patient Instructions (Signed)
Discussed orally 

## 2016-03-04 ENCOUNTER — Other Ambulatory Visit: Payer: Self-pay | Admitting: Dermatology

## 2016-03-04 DIAGNOSIS — D485 Neoplasm of uncertain behavior of skin: Secondary | ICD-10-CM | POA: Diagnosis not present

## 2016-03-10 DIAGNOSIS — F3340 Major depressive disorder, recurrent, in remission, unspecified: Secondary | ICD-10-CM | POA: Diagnosis not present

## 2016-03-26 ENCOUNTER — Ambulatory Visit (INDEPENDENT_AMBULATORY_CARE_PROVIDER_SITE_OTHER): Payer: BLUE CROSS/BLUE SHIELD | Admitting: Psychiatry

## 2016-03-26 ENCOUNTER — Encounter (HOSPITAL_COMMUNITY): Payer: Self-pay | Admitting: Psychiatry

## 2016-03-26 DIAGNOSIS — F331 Major depressive disorder, recurrent, moderate: Secondary | ICD-10-CM | POA: Diagnosis not present

## 2016-03-26 NOTE — Patient Instructions (Signed)
Discussed orally 

## 2016-03-26 NOTE — Progress Notes (Signed)
         THERAPIST PROGRESS NOTE  Session Time:  Friday 03/26/2016 9:15 AM - 9:59 AM                                 Participation Level: Active  Behavioral Response: Well GroomedAlert/Anxious  Type of Therapy: Individual Therapy  Treatment Goals addressed:   1. Learn and implement conflict resolution skills to resolve interpersonal problems       2. Identify and replace thoughts and beliefs that support depression and anxiety  Interventions: CBT and Supportive  Summary: Erica Lynch is a 55 y.o. female who is referred for services by PCP Dr. Willey Blade due to patient experiencing symptoms of depression and anxiety. She is a returning patient to this clinician and last was seen in 2014. She initially was seen in this practice in 2012.  She states her emotions are out of control and  reports feeling angry, anxious, unhappy, and crying a lot. She says she feels as if she will never be happy again. She also  states she doesn't want to be around people. Her main stressor is her marriage. She and her husband began to experience increased problems in their marriage when son left for college 2 years ago. They have little in common now and often disagree about money and caring for thier possessions. Patient also reports discord related to her lack of interest in sex since she has been menopausal. She states being tired of picking up after her husband and adult son. She reports additional stress related to not settling deceased  father's estate , conflict with her middle sister, and issues at work.  Patient last was seen 4 weeks ago. She reports experiencing increased depression for about  two weeks as two of her dogs died within two days. She saw her PCP who increased her Wellbutrin to 450 mg 2 weeks ago. Patient reports feeling much better and doing well. She states having more energy and having a positive mood. She has maintained positive self-care and social involvement. She reports enjoying  recent weekend trip with family. Patient continues to journal and reports increased nurturance of her spirituality.  Suicidal/Homicidal: None   Therapist Response: Reviewed symptoms, facilitated expression of feelings, reviewed information regarding lapse versus relapse along with early signs of depression, praised and reinforced patient's efforts regarding self-care and nurturing self along with her spirituality   Plan: Return again in 6 weeks.  Diagnosis: Axis I: Major depressive disorder, recurrent, moderate    Axis II: Deferred    Mckenna Boruff, LCSW 03/26/2016

## 2016-04-19 ENCOUNTER — Telehealth (HOSPITAL_COMMUNITY): Payer: Self-pay | Admitting: *Deleted

## 2016-04-19 DIAGNOSIS — F339 Major depressive disorder, recurrent, unspecified: Secondary | ICD-10-CM | POA: Diagnosis not present

## 2016-04-19 DIAGNOSIS — Z780 Asymptomatic menopausal state: Secondary | ICD-10-CM | POA: Diagnosis not present

## 2016-04-20 NOTE — Telephone Encounter (Signed)
done

## 2016-04-20 NOTE — Telephone Encounter (Signed)
noted 

## 2016-04-23 ENCOUNTER — Encounter (HOSPITAL_COMMUNITY): Payer: Self-pay | Admitting: Psychiatry

## 2016-04-23 ENCOUNTER — Ambulatory Visit (INDEPENDENT_AMBULATORY_CARE_PROVIDER_SITE_OTHER): Payer: BLUE CROSS/BLUE SHIELD | Admitting: Psychiatry

## 2016-04-23 VITALS — BP 107/81 | HR 86 | Ht 64.0 in | Wt 192.4 lb

## 2016-04-23 DIAGNOSIS — F331 Major depressive disorder, recurrent, moderate: Secondary | ICD-10-CM

## 2016-04-23 MED ORDER — CLONAZEPAM 1 MG PO TABS: 1.0000 mg | ORAL_TABLET | Freq: Two times a day (BID) | ORAL | 2 refills | Status: DC | PRN

## 2016-04-23 MED ORDER — BUPROPION HCL ER (XL) 300 MG PO TB24: 300.0000 mg | ORAL_TABLET | Freq: Every day | ORAL | 2 refills | Status: DC

## 2016-04-23 MED ORDER — BREXPIPRAZOLE 1 MG PO TABS: 1.0000 mg | ORAL_TABLET | Freq: Every day | ORAL | 2 refills | Status: DC

## 2016-04-23 NOTE — Progress Notes (Signed)
Patient ID: Erica Lynch, female   DOB: 18-Oct-1960, 55 y.o.   MRN: [id number] Patient ID: Erica Lynch, female   DOB: [birth year], 55 y.o.   MRN: [id number] Patient ID: Erica Lynch, female   DOB: 12-Jun-1961, 55 y.o.   MRN: [id number] Patient ID: Erica Lynch, female   DOB: 05/15/1961, 55 y.o.   MRN: [id number]  Psychiatric Initial Adult Assessment   Patient Identification: Erica Lynch MRN:  [id number] Date of Evaluation:  04/23/2016 Referral Source: Maurice Small MSW Chief Complaint:   Chief Complaint    Depression; Anxiety; Follow-up     Visit Diagnosis:    ICD-9-CM ICD-10-CM   1. Major depressive disorder, recurrent episode, moderate (HCC) 296.32 F33.1    Diagnosis:   Patient Active Problem List   Diagnosis Date Noted  . Vitamin D deficiency [E55.9] 12/05/2015  . Hot flashes [N95.1] 12/04/2015  . Fatigue [R53.83] 12/04/2015  . Major depression ([place]) [F32.9] 06/17/2015  . Breast calcifications on mammogram [R92.8] 11/27/2014  . Back pain with right-sided radiculopathy [M54.10] 10/08/2014  . 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 Other and unspecified ovarian cyst [N83.209] 05/23/2014  . Unspecified symptom associated with female genital organs [N94.9] 05/22/2014  . Hematuria [R31.9] 05/22/2014  . LLQ pain [R10.32] 05/22/2014  . History of breast lump [Z87.898] 02/19/2014  . Left breast mass [N63] 11/07/2013  . Screen for colon cancer [Z12.11] 08/04/2011   History of Present Illness:  This patient is a 55 year old married white female who lives with her husband and 42 year old son in [place]. 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  The patient was originally referred by her primary care physician, Dr. Willey Blade, to see Maurice Small for counseling. She in turn has referred her to me for medication management of depression and anxiety.  The patient states that her mood problems started during the first year after she had her son 20 years ago.  She felt sad down and was having crying spells. She was also having marital problems. At the time her husband was acting in an immature fashion, going out with friends a lot drinking and leaving her alone with the baby. She never had treatment for this at the time.  Over the years her depression worsened at times. Her mother died in Jan 24, 2006 from cancer which caused her to feel worse. Her primary physician put her on Zoloft and later Cymbalta and most recently on Prozac. Her father was an alcoholic who died in 0000000 from complications of alcoholism. In January 24, 2013 she decided try going off Prozac but got very depressed without it and went back on. She's now on accommodation of Prozac and Wellbutrin.  Despite the 2 antidepressants the patient feels sad all the time. Her energy isn't motivation are low. She states that she is able to function at home and work but just doesn't enjoy anything. She sleeps but only when she takes clonazepam at bedtime. Her marriage is still not going well and she feels that she and her husband have little in common. He still doesn't help much around the house and would rather spend time with his mother than a dinner with her. She feels alone much of the time. She does have one close friend that she does things with. She tries to exercise 2-3 times a week. She has never been suicidal has never had psychotic symptoms or previous psychiatric treatment.  The patient returns after 3 months. She states she's not been doing as well lately. 3  of her dogs died last month and she took it very hard. She was sad and crying all the time. She is also under a lot of stress at work. She's been isolating herself. Dr. Willey Blade, her primary physician increased her Wellbutrin XL to 450 mg daily but it made her feel hot at night and caused headaches so she is back to the 300 mg. She states that when things go wrong in her life she falls apart. I suggested augmentation with Rexulti and she is willing to try  it   Elements:  Location:  Global. Quality:  Moderate. Severity:  Moderate. Timing:  Daily. Duration:  20 years. Context:  Marital problems, loss of parents. Associated Signs/Symptoms: Depression Symptoms:  depressed mood, anhedonia, psychomotor retardation, fatigue, difficulty concentrating, anxiety, loss of energy/fatigue, (Hypo) Manic Symptoms:  Irritable Mood, Anxiety Symptoms:  Excessive Worry,   Past Medical History:  Past Medical History:  Diagnosis Date  . Anxiety   . Depression   . Fatigue 12/04/2015  . Hematuria 05/22/2014  . History of breast lump 02/19/2014  . Hot flashes 12/04/2015  . Hyperlipidemia   . IBS (irritable bowel syndrome)   . Left breast mass 11/07/2013  . LLQ pain 05/22/2014  . Lumbar herniated disc November 2016  . Other and unspecified ovarian cyst 05/23/2014  . PONV (postoperative nausea and vomiting)   . Unspecified symptom associated with female genital organs 05/22/2014  . Vitamin D deficiency 12/05/2015    Past Surgical History:  Procedure Laterality Date  . CESAREAN SECTION  1996  . COLONOSCOPY  08/2011   Dr. Gala Romney: normal  . ENDOMETRIAL ABLATION  2-3 yrs ago   [facility]  . OTHER SURGICAL HISTORY     Uterine ablation  . TUBAL LIGATION  6 yrs ago   [facility]   Family History:  Family History  Problem Relation Age of Onset  . Lung cancer Mother   . Breast cancer Mother   . Cancer Mother     breast  . Depression Mother   . Cirrhosis Father     etoh   . Anxiety disorder Father   . Alcohol abuse Father   . Depression Father   . Anesthesia problems Neg Hx   . Hypotension Neg Hx   . Malignant hyperthermia Neg Hx   . Pseudochol deficiency Neg Hx   . Diabetes Maternal Grandfather   . Heart disease Maternal Grandfather   . Depression Maternal Grandmother   . Depression Maternal Uncle   . Depression Maternal Uncle    Social History:   Social History   Social History  . Marital status: Married    Spouse name: N/A  . Number  of children: 1  . Years of education: N/A   Occupational History  . [employer]   Social History Main Topics  . Smoking status: Never Smoker  . Smokeless tobacco: Never Used  . Alcohol use Yes     Comment: glass of wine once a month sometime not at all  . Drug use: No  . Sexual activity: Yes    Birth control/ protection: Surgical     Comment: tubal   Other Topics Concern  . None   Social History Narrative   1 son-16         Additional Social History: The patient grew up in [place] with both parents. She has 2 sisters. She's never been the victim of any trauma or abuse. She has a 2 year college degree. 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 Her father owned a [employer] and asked her to join and she has stayed there ever since. Her brother-in-law now owns the store.  Musculoskeletal: Strength & Muscle Tone: within normal limits Gait & Station: normal Patient leans: N/A  Psychiatric Specialty Exam: Depression         Past medical history includes anxiety.   Anxiety  Symptoms include nervous/anxious behavior.      Review of Systems  Psychiatric/Behavioral: Positive for depression. The patient is nervous/anxious.   All other systems reviewed and are negative.   Blood pressure 107/81, pulse 86, height 5\' 4"  (1.626 m), weight 192 lb 6.4 oz (87.3 kg), last menstrual period 10/15/2013.Body mass index is 33.03 kg/m.  General Appearance: Casual, Neat and Well Groomed  Eye Contact:  Good  Speech:  Clear and Coherent  Volume:  Normal  Mood:Depressed but claims she's better than she was a few weeks ago   Affect: Sad, tearful   Thought Process:  Goal Directed  Orientation:  Full (Time, Place, and Person)  Thought Content:  Rumination  Suicidal Thoughts:  No  Homicidal Thoughts:  No  Memory:  Immediate;   Good Recent;   Good Remote;   Good  Judgement:  Good  Insight:  Good  Psychomotor Activity:  Normal  Concentration:  Fair   Recall:  Good  Fund of Knowledge:Good  Language: Good  Akathisia:  No  Handed:  Right  AIMS (if indicated):    Assets:  Communication Skills Desire for Improvement Physical Health Resilience Social Support Talents/Skills  ADL's:  Intact  Cognition: WNL  Sleep:  ok   Is the patient at risk to self?  No. Has the patient been a risk to self in the past 6 months?  No. Has the patient been a risk to self within the distant past?  No. Is the patient a risk to others?  No. Has the patient been a risk to others in the past 6 months?  No. Has the patient been a risk to others within the distant past?  No.  Allergies:  No Known Allergies Current Medications: Current Outpatient Prescriptions  Medication Sig Dispense Refill  . buPROPion (WELLBUTRIN XL) 300 MG 24 hr tablet Take 1 tablet (300 mg total) by mouth daily. 30 tablet 2  . Cholecalciferol 5000 units capsule Take 1 capsule (5,000 Units total) by mouth daily.    . clonazePAM (KLONOPIN) 1 MG tablet Take 1 tablet (1 mg total) by mouth 2 (two) times daily as needed for anxiety. 60 tablet 2  . estrogens, conjugated, (PREMARIN) 0.625 MG tablet Take 0.625 mg by mouth daily. Take daily for 21 days then do not take for 7 days.    . progesterone (PROMETRIUM) 200 MG capsule Take 200 mg by mouth daily.    . Brexpiprazole (REXULTI) 1 MG TABS Take 1 mg by mouth daily. 30 tablet 2   No current facility-administered medications for this visit.     Previous Psychotropic Medications: Yes   Substance Abuse History in the last 12 months:  No.  Consequences of Substance Abuse: NA  Medical Decision Making:  Review of Psycho-Social Stressors (1), Review or order clinical lab tests (1), Review and summation of old records (2), Established Problem, Worsening (2), Review of Medication Regimen & Side Effects (2) and Review of New Medication or Change in Dosage (2)  Treatment Plan Summary: Medication management   The patient will continue  Wellbutrin XL 300 mg daily. She'll  continue clonazepam as bedtime as needed for anxiety. She was given Rexulti starter pack with 0.5 mg daily for one week then increase to 1 mg daily.Carlene Coria return in 4 weeks    [place], [facility] 7/28/201710:20 AM Patient ID: ALEXANNA WADDEL, female   DOB: 12-23-1960, 55 y.o.   MRN: [id number]

## 2016-04-26 ENCOUNTER — Telehealth (HOSPITAL_COMMUNITY): Payer: Self-pay | Admitting: *Deleted

## 2016-04-26 NOTE — Telephone Encounter (Signed)
Prior authorization for Rexulti received. Called [employer] [place] spoke with Colletta [place] who states it must be done online with cover my meds. Competed and awaiting decision to be faxed.

## 2016-04-26 NOTE — Telephone Encounter (Signed)
noted 

## 2016-05-06 ENCOUNTER — Other Ambulatory Visit: Payer: Self-pay | Admitting: Obstetrics and Gynecology

## 2016-05-06 DIAGNOSIS — Z1231 Encounter for screening mammogram for malignant neoplasm of breast: Secondary | ICD-10-CM

## 2016-05-07 ENCOUNTER — Ambulatory Visit (INDEPENDENT_AMBULATORY_CARE_PROVIDER_SITE_OTHER): Payer: BLUE CROSS/BLUE SHIELD | Admitting: Psychiatry

## 2016-05-07 ENCOUNTER — Encounter (HOSPITAL_COMMUNITY): Payer: Self-pay | Admitting: Psychiatry

## 2016-05-07 DIAGNOSIS — F331 Major depressive disorder, recurrent, moderate: Secondary | ICD-10-CM | POA: Diagnosis not present

## 2016-05-07 NOTE — Progress Notes (Signed)
                  THERAPIST PROGRESS NOTE  Session Time:  Friday 05/07/2016 9:15 AM - 10:06 AM                                 Participation Level: Active  Behavioral Response: Well GroomedAlert/Anxious  Type of Therapy: Individual Therapy  Treatment Goals addressed:   1. Learn and implement conflict resolution skills to resolve interpersonal problems       2. Identify and replace thoughts and beliefs that support depression and anxiety  Interventions: CBT and Supportive  Summary: Erica Lynch is a 55 y.o. female who is referred for services by PCP Dr. Willey Blade due to patient experiencing symptoms of depression and anxiety. She is a returning patient to this clinician and last was seen in 2014. She initially was seen in this practice in 2012.  She states her emotions are out of control and  reports feeling angry, anxious, unhappy, and crying a lot. She says she feels as if she will never be happy again. She also  states she doesn't want to be around people. Her main stressor is her marriage. She and her husband began to experience increased problems in their marriage when son left for college 2 years ago. They have little in common now and often disagree about money and caring for thier possessions. Patient also reports discord related to her lack of interest in sex since she has been menopausal. She states being tired of picking up after her husband and adult son. She reports additional stress related to not settling deceased  father's estate , conflict with her middle sister, and issues at work.  Patient last was seen 6 weeks ago. She reports experiencing increased depression several weeks ago due triggered by another one of her pets dying and issues related to a recent vacation with extended family. She saw psychiatrist Dr. Harrington Challenger who prescribed Rexulti but patient reports only taking for two weeks as it made her feel bad. She currently is taking Welbutrin 300 mg. She reports feeling a  lot better in the last two weeks. She continues to report stress related to her job but is managing this well by setting boundaries, using self-talk, relaxation, and distracting activities. She also reports increased nurturance of spirituality which has been helpful. She admits decreased use of journaling and decreased social involvement. She expresses resentment and disappointment regarding marital issues.    Suicidal/Homicidal: None   Therapist Response: Reviewed symptoms, facilitated expression of feelings, reviewed information regarding lapse and early signs of depression, reviewed strategies to avoid relapse, encouraged patient to resume journaling to express feelings rather than internalizing, assisted patient to identify ways to increase social involvement and avoid isolative behaviors,  reinforced patient's efforts regarding self-care and nurturing self along with her spirituality   Plan: Return again in 6 weeks.  Diagnosis: Axis I: Major depressive disorder, recurrent, moderate    Axis II: Deferred    Erica Sison, LCSW 05/07/2016

## 2016-05-11 DIAGNOSIS — M722 Plantar fascial fibromatosis: Secondary | ICD-10-CM | POA: Diagnosis not present

## 2016-05-11 DIAGNOSIS — M79672 Pain in left foot: Secondary | ICD-10-CM | POA: Diagnosis not present

## 2016-05-25 ENCOUNTER — Ambulatory Visit (HOSPITAL_COMMUNITY): Payer: Self-pay | Admitting: Psychiatry

## 2016-06-01 DIAGNOSIS — D1801 Hemangioma of skin and subcutaneous tissue: Secondary | ICD-10-CM | POA: Diagnosis not present

## 2016-06-01 DIAGNOSIS — D239 Other benign neoplasm of skin, unspecified: Secondary | ICD-10-CM | POA: Diagnosis not present

## 2016-06-08 DIAGNOSIS — M79672 Pain in left foot: Secondary | ICD-10-CM | POA: Diagnosis not present

## 2016-06-08 DIAGNOSIS — M722 Plantar fascial fibromatosis: Secondary | ICD-10-CM | POA: Diagnosis not present

## 2016-06-11 ENCOUNTER — Ambulatory Visit (HOSPITAL_COMMUNITY)
Admission: RE | Admit: 2016-06-11 | Discharge: 2016-06-11 | Disposition: A | Discharge status: Home / Self Care | Payer: BLUE CROSS/BLUE SHIELD | Source: Ambulatory Visit | Attending: Obstetrics and Gynecology | Admitting: Obstetrics and Gynecology

## 2016-06-11 DIAGNOSIS — Z1231 Encounter for screening mammogram for malignant neoplasm of breast: Secondary | ICD-10-CM | POA: Insufficient documentation

## 2016-06-16 ENCOUNTER — Ambulatory Visit (INDEPENDENT_AMBULATORY_CARE_PROVIDER_SITE_OTHER): Payer: BLUE CROSS/BLUE SHIELD | Admitting: Psychiatry

## 2016-06-16 ENCOUNTER — Encounter (HOSPITAL_COMMUNITY): Payer: Self-pay | Admitting: Psychiatry

## 2016-06-16 VITALS — BP 128/99 | HR 92 | Ht 64.0 in | Wt 192.2 lb

## 2016-06-16 DIAGNOSIS — F331 Major depressive disorder, recurrent, moderate: Secondary | ICD-10-CM

## 2016-06-16 MED ORDER — CLONAZEPAM 1 MG PO TABS: 1.0000 mg | ORAL_TABLET | Freq: Two times a day (BID) | ORAL | 2 refills | Status: DC | PRN

## 2016-06-16 MED ORDER — BUPROPION HCL ER (XL) 300 MG PO TB24: 300.0000 mg | ORAL_TABLET | Freq: Every day | ORAL | 2 refills | Status: DC

## 2016-06-16 NOTE — Progress Notes (Signed)
Patient ID: Erica Lynch, female   DOB: 03/20/1961, 55 y.o.   MRN: [id number] Patient ID: Erica Lynch, female   DOB: [birth year], 55 y.o.   MRN: [id number] Patient ID: Erica Lynch, female   DOB: Apr 25, 1961, 55 y.o.   MRN: [id number] Patient ID: Erica Lynch, female   DOB: Oct 21, 1960, 55 y.o.   MRN: [id number]  Psychiatric Initial Adult Assessment   Patient Identification: Erica Lynch MRN:  [id number] Date of Evaluation:  06/16/2016 Referral Source: Maurice Small MSW Chief Complaint:   Chief Complaint    Anxiety; Depression; Follow-up     Visit Diagnosis:    ICD-9-CM ICD-10-CM   1. Major depressive disorder, recurrent episode, moderate (HCC) 296.32 F33.1    Diagnosis:   Patient Active Problem List   Diagnosis Date Noted  . Vitamin D deficiency [E55.9] 12/05/2015  . Hot flashes [N95.1] 12/04/2015  . Fatigue [R53.83] 12/04/2015  . Major depression ([place]) [F32.9] 06/17/2015  . Breast calcifications on mammogram [R92.8] 11/27/2014  . Back pain with right-sided radiculopathy [M54.10] 10/08/2014  . 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 Other and unspecified ovarian cyst [N83.209] 05/23/2014  . Unspecified symptom associated with female genital organs [N94.9] 05/22/2014  . Hematuria [R31.9] 05/22/2014  . LLQ pain [R10.32] 05/22/2014  . History of breast lump [Z87.898] 02/19/2014  . Left breast mass [N63] 11/07/2013  . Screen for colon cancer [Z12.11] 08/04/2011   History of Present Illness:  This patient is a 55 year old married white female who lives with her husband and 31 year old son in [place]. 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  The patient was originally referred by her primary care physician, Dr. Willey Blade, to see Maurice Small for counseling. She in turn has referred her to me for medication management of depression and anxiety.  The patient states that her mood problems started during the first year after she had her son 20 years ago.  She felt sad down and was having crying spells. She was also having marital problems. At the time her husband was acting in an immature fashion, going out with friends a lot drinking and leaving her alone with the baby. She never had treatment for this at the time.  Over the years her depression worsened at times. Her mother died in 01/08/06 from cancer which caused her to feel worse. Her primary physician put her on Zoloft and later Cymbalta and most recently on Prozac. Her father was an alcoholic who died in 0000000 from complications of alcoholism. In Jan 08, 2013 she decided try going off Prozac but got very depressed without it and went back on. She's now on accommodation of Prozac and Wellbutrin.  Despite the 2 antidepressants the patient feels sad all the time. Her energy isn't motivation are low. She states that she is able to function at home and work but just doesn't enjoy anything. She sleeps but only when she takes clonazepam at bedtime. Her marriage is still not going well and she feels that she and her husband have little in common. He still doesn't help much around the house and would rather spend time with his mother than a dinner with her. She feels alone much of the time. She does have one close friend that she does things with. She tries to exercise 2-3 times a week. She has never been suicidal has never had psychotic symptoms or previous psychiatric treatment.  The patient returns after 3 months. She states that she tried a two-week sample pack of  Rexulti but it made her feel jumpy inside and she stopped it. She lost another dog who got hit on the road. However she feels like she is coping well with the Wellbutrin and clonazepam. She staying busy at work and her son is doing very well. She states her relationship with her husband is "up and down" but she is coping with it. She's benefiting greatly from her therapy with Maurice Small   Elements:  Location:  Global. Quality:  Moderate. Severity:   Moderate. Timing:  Daily. Duration:  20 years. Context:  Marital problems, loss of parents. Associated Signs/Symptoms: Depression Symptoms:  depressed mood, anhedonia, psychomotor retardation, fatigue, difficulty concentrating, anxiety, loss of energy/fatigue, (Hypo) Manic Symptoms:  Irritable Mood, Anxiety Symptoms:  Excessive Worry,   Past Medical History:  Past Medical History:  Diagnosis Date  . Anxiety   . Depression   . Fatigue 12/04/2015  . Hematuria 05/22/2014  . History of breast lump 02/19/2014  . Hot flashes 12/04/2015  . Hyperlipidemia   . IBS (irritable bowel syndrome)   . Left breast mass 11/07/2013  . LLQ pain 05/22/2014  . Lumbar herniated disc November 2016  . Other and unspecified ovarian cyst 05/23/2014  . PONV (postoperative nausea and vomiting)   . Unspecified symptom associated with female genital organs 05/22/2014  . Vitamin D deficiency 12/05/2015    Past Surgical History:  Procedure Laterality Date  . CESAREAN SECTION  1996  . COLONOSCOPY  08/2011   Dr. Gala Romney: normal  . ENDOMETRIAL ABLATION  2-3 yrs ago   [facility]  . OTHER SURGICAL HISTORY     Uterine ablation  . TUBAL LIGATION  6 yrs ago   [facility]   Family History:  Family History  Problem Relation Age of Onset  . Lung cancer Mother   . Breast cancer Mother   . Cancer Mother     breast  . Depression Mother   . Cirrhosis Father     etoh   . Anxiety disorder Father   . Alcohol abuse Father   . Depression Father   . Diabetes Maternal Grandfather   . Heart disease Maternal Grandfather   . Depression Maternal Grandmother   . Depression Maternal Uncle   . Depression Maternal Uncle   . Anesthesia problems Neg Hx   . Hypotension Neg Hx   . Malignant hyperthermia Neg Hx   . Pseudochol deficiency Neg Hx    Social History:   Social History   Social History  . Marital status: Married    Spouse name: N/A  . Number of children: 1  . Years of education: N/A   Occupational History  .  [employer]   Social History Main Topics  . Smoking status: Never Smoker  . Smokeless tobacco: Never Used  . Alcohol use Yes     Comment: glass of wine once a month sometime not at all  . Drug use: No  . Sexual activity: Yes    Birth control/ protection: Surgical     Comment: tubal   Other Topics Concern  . None   Social History Narrative   1 son-16         Additional Social History: The patient grew up in [place] with both parents. She has 2 sisters. She's never been the victim of any trauma or abuse. She has a 2 year college degree. 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 Her father owned a [employer] and asked her to  join and she has stayed there ever since. Her brother-in-law now owns the store.  Musculoskeletal: Strength & Muscle Tone: within normal limits Gait & Station: normal Patient leans: N/A  Psychiatric Specialty Exam: Depression         Past medical history includes anxiety.   Anxiety  Symptoms include nervous/anxious behavior.      Review of Systems  Psychiatric/Behavioral: Positive for depression. The patient is nervous/anxious.   All other systems reviewed and are negative.   Blood pressure (!) 128/99, pulse 92, height 5\' 4"  (1.626 m), weight 192 lb 3.2 oz (87.2 kg), last menstrual period 10/15/2013, SpO2 97 %.Body mass index is 32.99 kg/m.  General Appearance: Casual, Neat and Well Groomed  Eye Contact:  Good  Speech:  Clear and Coherent  Volume:  Normal  Mood:Fairly good   Affect: Bright   Thought Process:  Goal Directed  Orientation:  Full (Time, Place, and Person)  Thought Content:  Rumination  Suicidal Thoughts:  No  Homicidal Thoughts:  No  Memory:  Immediate;   Good Recent;   Good Remote;   Good  Judgement:  Good  Insight:  Good  Psychomotor Activity:  Normal  Concentration:  Fair  Recall:  Good  Fund of Knowledge:Good  Language: Good  Akathisia:  No  Handed:  Right  AIMS (if indicated):     Assets:  Communication Skills Desire for Improvement Physical Health Resilience Social Support Talents/Skills  ADL's:  Intact  Cognition: WNL  Sleep:  ok   Is the patient at risk to self?  No. Has the patient been a risk to self in the past 6 months?  No. Has the patient been a risk to self within the distant past?  No. Is the patient a risk to others?  No. Has the patient been a risk to others in the past 6 months?  No. Has the patient been a risk to others within the distant past?  No.  Allergies:  No Known Allergies Current Medications: Current Outpatient Prescriptions  Medication Sig Dispense Refill  . buPROPion (WELLBUTRIN XL) 300 MG 24 hr tablet Take 1 tablet (300 mg total) by mouth daily. 30 tablet 2  . Cholecalciferol 5000 units capsule Take 1 capsule (5,000 Units total) by mouth daily.    . clonazePAM (KLONOPIN) 1 MG tablet Take 1 tablet (1 mg total) by mouth 2 (two) times daily as needed for anxiety. 60 tablet 2  . estrogens, conjugated, (PREMARIN) 0.625 MG tablet Take 0.625 mg by mouth daily. Take daily for 21 days then do not take for 7 days.    . progesterone (PROMETRIUM) 200 MG capsule Take 200 mg by mouth daily.     No current facility-administered medications for this visit.     Previous Psychotropic Medications: Yes   Substance Abuse History in the last 12 months:  No.  Consequences of Substance Abuse: NA  Medical Decision Making:  Review of Psycho-Social Stressors (1), Review or order clinical lab tests (1), Review and summation of old records (2), Established Problem, Worsening (2), Review of Medication Regimen & Side Effects (2) and Review of New Medication or Change in Dosage (2)  Treatment Plan Summary: Medication management   The patient will continue Wellbutrin XL 300 mg daily. She'll continue clonazepam as bedtime as needed for anxiety. Carlene Coria return in 3 months    [place], [facility] 9/20/20178:15 AM Patient ID: DETA DIPASQUALE, female   DOB:  10/10/1960, 55 y.o.   MRN: [id number]

## 2016-06-17 ENCOUNTER — Ambulatory Visit (INDEPENDENT_AMBULATORY_CARE_PROVIDER_SITE_OTHER): Payer: BLUE CROSS/BLUE SHIELD | Admitting: Psychiatry

## 2016-06-17 ENCOUNTER — Encounter (HOSPITAL_COMMUNITY): Payer: Self-pay | Admitting: Psychiatry

## 2016-06-17 DIAGNOSIS — F331 Major depressive disorder, recurrent, moderate: Secondary | ICD-10-CM | POA: Diagnosis not present

## 2016-06-17 DIAGNOSIS — N959 Unspecified menopausal and perimenopausal disorder: Secondary | ICD-10-CM | POA: Diagnosis not present

## 2016-06-17 DIAGNOSIS — F3340 Major depressive disorder, recurrent, in remission, unspecified: Secondary | ICD-10-CM | POA: Diagnosis not present

## 2016-06-17 DIAGNOSIS — Z23 Encounter for immunization: Secondary | ICD-10-CM | POA: Diagnosis not present

## 2016-06-17 NOTE — Progress Notes (Signed)
      THERAPIST PROGRESS NOTE  Session Time:  Thursday 06/17/2016 9:05 AM - 10:05 AM                                  Participation Level: Active  Behavioral Response: Well GroomedAlert/Angry  Type of Therapy: Individual Therapy  Treatment Goals addressed:   1. Learn and implement conflict resolution skills to resolve interpersonal problems       2. Identify and replace thoughts and beliefs that support depression and anxiety  Interventions: CT (REBT)  Summary: Erica Lynch is a 55 y.o. female who is referred for services by PCP Dr. Willey Blade due to patient experiencing symptoms of depression and anxiety. She is a returning patient to this clinician and last was seen in 2014. She initially was seen in this practice in 2012.  She states her emotions are out of control and  reports feeling angry, anxious, unhappy, and crying a lot. She says she feels as if she will never be happy again. She also  states she doesn't want to be around people. Her main stressor is her marriage. She and her husband began to experience increased problems in their marriage when son left for college 2 years ago. They have little in common now and often disagree about money and caring for thier possessions. Patient also reports discord related to her lack of interest in sex since she has been menopausal. She states being tired of picking up after her husband and adult son. She reports additional stress related to not settling deceased  father's estate , conflict with her middle sister, and issues at work.  Patient last was seen 6 weeks ago. She reports another one of her pets died about 5 weeks ago after he was hit by a car. She expresses sadness about this but reports coping with this well. She has resumed journaling consistently and has increased social involvement. She also has resumed exercising. She also continues to nurture her spirituality. She reports overall improvement in modd but reports increased anger and  frustration regarding her husband. She reports recently having an emotional outburst with husband. She expresses ambivalent feelings about her marriage.   Suicidal/Homicidal: None   Therapist Response: Reviewed symptoms, facilitated expression of feelings, explored patient's thought patterns regarding her expectations from her husband, assisted patient identify effects of these thought patterns on her mood, behavior, and interaction with husband, used handout to assist patient find the "should behind her anger", assisted patient identify ways to replace "should" statements with healthy alternatives, assisted patient identify ways to improve assertive communication in relationship with husband   Plan: Return again in 6 weeks.  Diagnosis: Axis I: Major depressive disorder, recurrent, moderate    Axis II: Deferred    BYNUM,[name], LCSW 06/17/2016

## 2016-06-18 ENCOUNTER — Ambulatory Visit (HOSPITAL_COMMUNITY): Payer: Self-pay | Admitting: Psychiatry

## 2016-07-08 ENCOUNTER — Ambulatory Visit (HOSPITAL_COMMUNITY): Payer: Self-pay | Admitting: Psychiatry

## 2016-07-14 DIAGNOSIS — H18832 Recurrent erosion of cornea, left eye: Secondary | ICD-10-CM | POA: Diagnosis not present

## 2016-07-16 ENCOUNTER — Encounter (HOSPITAL_COMMUNITY): Payer: Self-pay | Admitting: Psychiatry

## 2016-07-16 ENCOUNTER — Ambulatory Visit (INDEPENDENT_AMBULATORY_CARE_PROVIDER_SITE_OTHER): Payer: BLUE CROSS/BLUE SHIELD | Admitting: Psychiatry

## 2016-07-16 DIAGNOSIS — F331 Major depressive disorder, recurrent, moderate: Secondary | ICD-10-CM | POA: Diagnosis not present

## 2016-07-16 NOTE — Progress Notes (Signed)
      THERAPIST PROGRESS NOTE  Session Time:  Friday 07/16/2016 11:11 AM - 11:58 AM                                  Participation Level: Active  Behavioral Response: Well GroomedAlert/Angry  Type of Therapy: Individual Therapy  Treatment Goals addressed:   1. Learn and implement conflict resolution skills to resolve interpersonal problems       2. Identify and replace thoughts and beliefs that support depression and anxiety  Interventions: CBT   Summary: Erica Lynch is a 55 y.o. female who is referred for services by PCP Dr. Willey Blade due to patient experiencing symptoms of depression and anxiety. She is a returning patient to this clinician and last was seen in 2014. She initially was seen in this practice in 2012.  She states her emotions are out of control and  reports feeling angry, anxious, unhappy, and crying a lot. She says she feels as if she will never be happy again. She also  states she doesn't want to be around people. Her main stressor is her marriage. She and her husband began to experience increased problems in their marriage when son left for college 2 years ago. They have little in common now and often disagree about money and caring for thier possessions. Patient also reports discord related to her lack of interest in sex since she has been menopausal. She states being tired of picking up after her husband and adult son. She reports additional stress related to not settling deceased  father's estate , conflict with her middle sister, and issues at work.  Patient last was seen 4  weeks ago. She reports decreased disturbed anger regarding interactions with husband. She reports having more realistic expectations of the relationship and has begun to identify underlying feelings beneath anger including disappointment and frustration.  She has continued to journal her thoughts and feelings which has been helpful. She reports experiencing sadness a few weeks ago due to the fourth  anniversary of her father's death. She expresses disappointment family members did not call or talk her about this but reports eventually calling sisters and sharing her thoughts and feelings.    Suicidal/Homicidal: None   Therapist Response: Reviewed symptoms, facilitated expression of feelings, discussed connection between thoughts/mood/behaviors, assisted patient identify, challenge, and replace cognitive distortions, assigned patient to continued to  journal and use handout provided in previous session to identify, challenge, and replace cognitive distortions  Plan: Return again in 6 weeks.  Diagnosis: Axis I: Major depressive disorder, recurrent, moderate    Axis II: Deferred    Nazire Fruth, LCSW 07/16/2016

## 2016-08-04 DIAGNOSIS — H00029 Hordeolum internum unspecified eye, unspecified eyelid: Secondary | ICD-10-CM | POA: Diagnosis not present

## 2016-08-27 ENCOUNTER — Ambulatory Visit (INDEPENDENT_AMBULATORY_CARE_PROVIDER_SITE_OTHER): Payer: BLUE CROSS/BLUE SHIELD | Admitting: Psychiatry

## 2016-08-27 ENCOUNTER — Encounter (HOSPITAL_COMMUNITY): Payer: Self-pay | Admitting: Psychiatry

## 2016-08-27 DIAGNOSIS — F331 Major depressive disorder, recurrent, moderate: Secondary | ICD-10-CM

## 2016-08-27 NOTE — Progress Notes (Signed)
THERAPIST PROGRESS NOTE   Session Time:  Friday 08/27/2016 10:59 AM - 11:59 AM              Participation Level: Active  Behavioral Response: Well GroomedAlert/Angry  Type of Therapy: Individual Therapy  Treatment Goals addressed:   1. Learn and implement conflict resolution skills to resolve interpersonal problems       2. Identify and replace thoughts and beliefs that support depression and anxiety  Interventions: CBT   Summary: Erica Lynch is a 55 y.o. female who is referred for services by PCP Dr. Willey Blade due to patient experiencing symptoms of depression and anxiety. She is a returning patient to this clinician and last was seen in 2014. She initially was seen in this practice in 2012.  She states her emotions are out of control and  reports feeling angry, anxious, unhappy, and crying a lot. She says she feels as if she will never be happy again. She also  states she doesn't want to be around people. Her main stressor is her marriage. She and her husband began to experience increased problems in their marriage when son left for college 2 years ago. They have little in common now and often disagree about money and caring for thier possessions. Patient also reports discord related to her lack of interest in sex since she has been menopausal. She states being tired of picking up after her husband and adult son. She reports additional stress related to not settling deceased  father's estate , conflict with her middle sister, and issues at work.  Patient last was seen 6 weeks ago. She reports enjoying time alone while husband was away for about 4 weeks. However, she began to experience increased negative thoughts and feelings upon his return. She reports reverting to aggressive communication versus assertive communication. She also expresses anger and disappointment regarding relationship with her best friend.  Suicidal/Homicidal: None   Therapist Response: Reviewed symptoms,  facilitated expression of feelings, assisted patient in examining her thought patterns regarding interactions with husband and friend, assisted patient identify this should behind her anger, assisted patient identify realistic expectations, assisted patient identify ways to be assertive rather than aggressive in her communication, reviewed relaxation and coping techniques   Plan: Return again in 3 weeks.  Diagnosis: Axis I: Major depressive disorder, recurrent, moderate    Axis II: Deferred    Maycee Blasco, LCSW 08/27/2016

## 2016-09-13 ENCOUNTER — Encounter (HOSPITAL_COMMUNITY): Payer: Self-pay | Admitting: Psychiatry

## 2016-09-13 ENCOUNTER — Ambulatory Visit (INDEPENDENT_AMBULATORY_CARE_PROVIDER_SITE_OTHER): Payer: BLUE CROSS/BLUE SHIELD | Admitting: Psychiatry

## 2016-09-13 VITALS — BP 105/66 | HR 88 | Ht 64.0 in | Wt 194.2 lb

## 2016-09-13 DIAGNOSIS — Z801 Family history of malignant neoplasm of trachea, bronchus and lung: Secondary | ICD-10-CM | POA: Diagnosis not present

## 2016-09-13 DIAGNOSIS — F331 Major depressive disorder, recurrent, moderate: Secondary | ICD-10-CM

## 2016-09-13 DIAGNOSIS — Z803 Family history of malignant neoplasm of breast: Secondary | ICD-10-CM | POA: Diagnosis not present

## 2016-09-13 DIAGNOSIS — Z9889 Other specified postprocedural states: Secondary | ICD-10-CM

## 2016-09-13 DIAGNOSIS — Z811 Family history of alcohol abuse and dependence: Secondary | ICD-10-CM

## 2016-09-13 DIAGNOSIS — Z8489 Family history of other specified conditions: Secondary | ICD-10-CM

## 2016-09-13 DIAGNOSIS — Z818 Family history of other mental and behavioral disorders: Secondary | ICD-10-CM

## 2016-09-13 MED ORDER — CLONAZEPAM 1 MG PO TABS
1.0000 mg | ORAL_TABLET | Freq: Two times a day (BID) | ORAL | 3 refills | Status: DC | PRN
Start: 2016-09-13 — End: 2017-01-12

## 2016-09-13 MED ORDER — BUPROPION HCL ER (XL) 300 MG PO TB24: 300.0000 mg | ORAL_TABLET | Freq: Every day | ORAL | 3 refills | Status: DC

## 2016-09-13 NOTE — Progress Notes (Signed)
Patient ID: Erica Lynch, female   DOB: Apr 19, 1961, 55 y.o.   MRN: [id number] Patient ID: Erica Lynch, female   DOB: 1961/01/06, 55 y.o.   MRN: [id number] Patient ID: Erica Lynch, female   DOB: [birth year], 55 y.o.   MRN: [id number] Patient ID: Erica Lynch, female   DOB: 09-Jan-1961, 55 y.o.   MRN: [id number]  Psychiatric Initial Adult Assessment   Patient Identification: Erica Lynch MRN:  [id number] Date of Evaluation:  09/13/2016 Referral Source: Maurice Small MSW Chief Complaint:   Chief Complaint    Depression; Anxiety; Follow-up     Visit Diagnosis:    ICD-9-CM ICD-10-CM   1. Major depressive disorder, recurrent episode, moderate (HCC) 296.32 F33.1    Diagnosis:   Patient Active Problem List   Diagnosis Date Noted  . Vitamin D deficiency [E55.9] 12/05/2015  . Hot flashes [R23.2] 12/04/2015  . Fatigue [R53.83] 12/04/2015  . Major depression [F32.9] 06/17/2015  . Breast calcifications on mammogram [R92.1] 11/27/2014  . Back pain with right-sided radiculopathy [M54.10] 10/08/2014  . 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 Other and unspecified ovarian cyst [N83.209] 05/23/2014  . Unspecified symptom associated with female genital organs [N94.9] 05/22/2014  . Hematuria [R31.9] 05/22/2014  . LLQ pain [R10.32] 05/22/2014  . History of breast lump [Z87.898] 02/19/2014  . Left breast mass [N63.20] 11/07/2013  . Screen for colon cancer [Z12.11] 08/04/2011   History of Present Illness:  This patient is a 55 year old married white female who lives with her husband and 30 year old son in [place]. 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  The patient was originally referred by her primary care physician, Dr. Willey Blade, to see Maurice Small for counseling. She in turn has referred her to me for medication management of depression and anxiety.  The patient states that her mood problems started during the first year after she had her son 20 years ago.  She felt sad down and was having crying spells. She was also having marital problems. At the time her husband was acting in an immature fashion, going out with friends a lot drinking and leaving her alone with the baby. She never had treatment for this at the time.  Over the years her depression worsened at times. Her mother died in 2006/01/02 from cancer which caused her to feel worse. Her primary physician put her on Zoloft and later Cymbalta and most recently on Prozac. Her father was an alcoholic who died in 0000000 from complications of alcoholism. In 01/02/2013 she decided try going off Prozac but got very depressed without it and went back on. She's now on accommodation of Prozac and Wellbutrin.  Despite the 2 antidepressants the patient feels sad all the time. Her energy isn't motivation are low. She states that she is able to function at home and work but just doesn't enjoy anything. She sleeps but only when she takes clonazepam at bedtime. Her marriage is still not going well and she feels that she and her husband have little in common. He still doesn't help much around the house and would rather spend time with his mother than a dinner with her. She feels alone much of the time. She does have one close friend that she does things with. She tries to exercise 2-3 times a week. She has never been suicidal has never had psychotic symptoms or previous psychiatric treatment.  The patient returns after 3 months. She states that she is doing well and working a lot  of hours. She and her husband are getting along fairly well although he travels on his own somewhat. She denies any symptoms of depression or anxiety today and is sleeping well with the use of clonazepam. Wellbutrin still seems to be helping her mood   Elements:  Location:  Global. Quality:  Moderate. Severity:  Moderate. Timing:  Daily. Duration:  20 years. Context:  Marital problems, loss of parents. Associated Signs/Symptoms: Depression Symptoms:   depressed mood, anhedonia, psychomotor retardation, fatigue, difficulty concentrating, anxiety, loss of energy/fatigue, (Hypo) Manic Symptoms:  Irritable Mood, Anxiety Symptoms:  Excessive Worry,   Past Medical History:  Past Medical History:  Diagnosis Date  . Anxiety   . Depression   . Fatigue 12/04/2015  . Hematuria 05/22/2014  . History of breast lump 02/19/2014  . Hot flashes 12/04/2015  . Hyperlipidemia   . IBS (irritable bowel syndrome)   . Left breast mass 11/07/2013  . LLQ pain 05/22/2014  . Lumbar herniated disc November 2016  . Other and unspecified ovarian cyst 05/23/2014  . PONV (postoperative nausea and vomiting)   . Unspecified symptom associated with female genital organs 05/22/2014  . Vitamin D deficiency 12/05/2015    Past Surgical History:  Procedure Laterality Date  . CESAREAN SECTION  1996  . COLONOSCOPY  08/2011   Dr. Gala Romney: normal  . ENDOMETRIAL ABLATION  2-3 yrs ago   [facility]  . OTHER SURGICAL HISTORY     Uterine ablation  . TUBAL LIGATION  6 yrs ago   [facility]   Family History:  Family History  Problem Relation Age of Onset  . Lung cancer Mother   . Breast cancer Mother   . Cancer Mother     breast  . Depression Mother   . Cirrhosis Father     etoh   . Anxiety disorder Father   . Alcohol abuse Father   . Depression Father   . Diabetes Maternal Grandfather   . Heart disease Maternal Grandfather   . Depression Maternal Grandmother   . Depression Maternal Uncle   . Depression Maternal Uncle   . Anesthesia problems Neg Hx   . Hypotension Neg Hx   . Malignant hyperthermia Neg Hx   . Pseudochol deficiency Neg Hx    Social History:   Social History   Social History  . Marital status: Married    Spouse name: N/A  . Number of children: 1  . Years of education: N/A   Occupational History  . [employer]   Social History Main Topics  . Smoking status: Never Smoker  . Smokeless tobacco: Never Used  .  Alcohol use Yes     Comment: glass of wine once a month sometime not at all  . Drug use: No  . Sexual activity: Yes    Birth control/ protection: Surgical     Comment: tubal   Other Topics Concern  . None   Social History Narrative   1 son-16         Additional Social History: The patient grew up in [place] with both parents. She has 2 sisters. She's never been the victim of any trauma or abuse. She has a 2 year college degree. 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 Her brother-in-law now owns the store.  Musculoskeletal: Strength & Muscle Tone: within normal limits Gait & Station: normal Patient leans:  N/A  Psychiatric Specialty Exam: Anxiety  Symptoms include nervous/anxious behavior.    Depression         Past medical history includes anxiety.     Review of Systems  Psychiatric/Behavioral: Positive for depression. The patient is nervous/anxious.   All other systems reviewed and are negative.   Blood pressure 105/66, pulse 88, height 5\' 4"  (1.626 m), weight 194 lb 3.2 oz (88.1 kg), last menstrual period 10/15/2013, SpO2 95 %.Body mass index is 33.33 kg/m.  General Appearance: Casual, Neat and Well Groomed  Eye Contact:  Good  Speech:  Clear and Coherent  Volume:  Normal  Mood: good   Affect: Bright   Thought Process:  Goal Directed  Orientation:  Full (Time, Place, and Person)  Thought Content:  Rumination  Suicidal Thoughts:  No  Homicidal Thoughts:  No  Memory:  Immediate;   Good Recent;   Good Remote;   Good  Judgement:  Good  Insight:  Good  Psychomotor Activity:  Normal  Concentration:  Fair  Recall:  Good  Fund of Knowledge:Good  Language: Good  Akathisia:  No  Handed:  Right  AIMS (if indicated):    Assets:  Communication Skills Desire for Improvement Physical Health Resilience Social Support Talents/Skills  ADL's:  Intact  Cognition: WNL   Sleep:  ok   Is the patient at risk to self?  No. Has the patient been a risk to self in the past 6 months?  No. Has the patient been a risk to self within the distant past?  No. Is the patient a risk to others?  No. Has the patient been a risk to others in the past 6 months?  No. Has the patient been a risk to others within the distant past?  No.  Allergies:  No Known Allergies Current Medications: Current Outpatient Prescriptions  Medication Sig Dispense Refill  . buPROPion (WELLBUTRIN XL) 300 MG 24 hr tablet Take 1 tablet (300 mg total) by mouth daily. 30 tablet 3  . Cholecalciferol 5000 units capsule Take 1 capsule (5,000 Units total) by mouth daily.    . clonazePAM (KLONOPIN) 1 MG tablet Take 1 tablet (1 mg total) by mouth 2 (two) times daily as needed for anxiety. 60 tablet 3  . estrogens, conjugated, (PREMARIN) 0.625 MG tablet Take 0.625 mg by mouth daily. Take daily for 21 days then do not take for 7 days.    . progesterone (PROMETRIUM) 200 MG capsule Take 200 mg by mouth daily.     No current facility-administered medications for this visit.     Previous Psychotropic Medications: Yes   Substance Abuse History in the last 12 months:  No.  Consequences of Substance Abuse: NA  Medical Decision Making:  Review of Psycho-Social Stressors (1), Review or order clinical lab tests (1), Review and summation of old records (2), Established Problem, Worsening (2), Review of Medication Regimen & Side Effects (2) and Review of New Medication or Change in Dosage (2)  Treatment Plan Summary: Medication management   The patient will continue Wellbutrin XL 300 mg daily. She'll continue clonazepam as bedtime as needed for anxiety. . She'll return in 4 months    [place], [facility] 12/18/20178:30 AM Patient ID: Erica Lynch, female   DOB: 10-11-1960, 55 y.o.   MRN: [id number]

## 2016-09-23 ENCOUNTER — Encounter: Payer: Self-pay | Admitting: Adult Health

## 2016-09-23 ENCOUNTER — Ambulatory Visit (INDEPENDENT_AMBULATORY_CARE_PROVIDER_SITE_OTHER): Payer: BLUE CROSS/BLUE SHIELD | Admitting: Adult Health

## 2016-09-23 VITALS — BP 117/86 | HR 80 | Ht 64.0 in | Wt 196.5 lb

## 2016-09-23 DIAGNOSIS — N760 Acute vaginitis: Secondary | ICD-10-CM

## 2016-09-23 DIAGNOSIS — R300 Dysuria: Secondary | ICD-10-CM | POA: Diagnosis not present

## 2016-09-23 DIAGNOSIS — F329 Major depressive disorder, single episode, unspecified: Secondary | ICD-10-CM

## 2016-09-23 DIAGNOSIS — R319 Hematuria, unspecified: Secondary | ICD-10-CM

## 2016-09-23 DIAGNOSIS — B9689 Other specified bacterial agents as the cause of diseases classified elsewhere: Secondary | ICD-10-CM | POA: Diagnosis not present

## 2016-09-23 DIAGNOSIS — F32A Depression, unspecified: Secondary | ICD-10-CM

## 2016-09-23 DIAGNOSIS — N898 Other specified noninflammatory disorders of vagina: Secondary | ICD-10-CM | POA: Diagnosis not present

## 2016-09-23 LAB — POCT URINALYSIS DIPSTICK
GLUCOSE UA: NEGATIVE
Ketones, UA: NEGATIVE
Nitrite, UA: NEGATIVE
PROTEIN UA: NEGATIVE

## 2016-09-23 LAB — POCT WET PREP (WET MOUNT): Clue Cells Wet Prep Whiff POC: POSITIVE

## 2016-09-23 MED ORDER — SULFAMETHOXAZOLE-TRIMETHOPRIM 800-160 MG PO TABS: 1.0000 | ORAL_TABLET | Freq: Two times a day (BID) | ORAL | 0 refills | Status: DC

## 2016-09-23 MED ORDER — METRONIDAZOLE 500 MG PO TABS: 500.0000 mg | ORAL_TABLET | Freq: Two times a day (BID) | ORAL | 0 refills | Status: DC

## 2016-09-23 NOTE — Patient Instructions (Signed)
Bacterial Vaginosis Bacterial vaginosis is a vaginal infection that occurs when the normal balance of bacteria in the vagina is disrupted. It results from an overgrowth of certain bacteria. This is the most common vaginal infection among women ages 82-44. Because bacterial vaginosis increases your risk for STIs (sexually transmitted infections), getting treated can help reduce your risk for chlamydia, gonorrhea, herpes, and HIV (human immunodeficiency virus). Treatment is also important for preventing complications in pregnant women, because this condition can cause an early (premature) delivery. What are the causes? This condition is caused by an increase in harmful bacteria that are normally present in small amounts in the vagina. However, the reason that the condition develops is not fully understood. What increases the risk? The following factors may make you more likely to develop this condition:  Having a new sexual partner or multiple sexual partners.  Having unprotected sex.  Douching.  Having an intrauterine device (IUD).  Smoking.  Drug and alcohol abuse.  Taking certain antibiotic medicines.  Being pregnant. You cannot get bacterial vaginosis from toilet seats, bedding, swimming pools, or contact with objects around you. What are the signs or symptoms? Symptoms of this condition include:  Grey or white vaginal discharge. The discharge can also be watery or foamy.  A fish-like odor with discharge, especially after sexual intercourse or during menstruation.  Itching in and around the vagina.  Burning or pain with urination. Some women with bacterial vaginosis have no signs or symptoms. How is this diagnosed? This condition is diagnosed based on:  Your medical history.  A physical exam of the vagina.  Testing a sample of vaginal fluid under a microscope to look for a large amount of bad bacteria or abnormal cells. Your health care provider may use a cotton swab or a  small wooden spatula to collect the sample. How is this treated? This condition is treated with antibiotics. These may be given as a pill, a vaginal cream, or a medicine that is put into the vagina (suppository). If the condition comes back after treatment, a second round of antibiotics may be needed. Follow these instructions at home: Medicines  Take over-the-counter and prescription medicines only as told by your health care provider.  Take or use your antibiotic as told by your health care provider. Do not stop taking or using the antibiotic even if you start to feel better. General instructions  If you have a female sexual partner, tell her that you have a vaginal infection. She should see her health care provider and be treated if she has symptoms. If you have a female sexual partner, he does not need treatment.  During treatment:  Avoid sexual activity until you finish treatment.  Do not douche.  Avoid alcohol as directed by your health care provider.  Avoid breastfeeding as directed by your health care provider.  Drink enough water and fluids to keep your urine clear or pale yellow.  Keep the area around your vagina and rectum clean.  Wash the area daily with warm water.  Wipe yourself from front to back after using the toilet.  Keep all follow-up visits as told by your health care provider. This is important. How is this prevented?  Do not douche.  Wash the outside of your vagina with warm water only.  Use protection when having sex. This includes latex condoms and dental dams.  Limit how many sexual partners you have. To help prevent bacterial vaginosis, it is best to have sex with just one partner (monogamous).  Make sure you and your sexual partner are tested for STIs.  Wear cotton or cotton-lined underwear.  Avoid wearing tight pants and pantyhose, especially during summer.  Limit the amount of alcohol that you drink.  Do not use any products that contain  nicotine or tobacco, such as cigarettes and e-cigarettes. If you need help quitting, ask your health care provider.  Do not use illegal drugs. Where to find more information:  Centers for Disease Control and Prevention: AppraiserFraud.fi  American Sexual Health Association (ASHA): www.ashastd.org  U.S. Department of Health and Financial controller, Office on Women's Health: DustingSprays.pl or SecuritiesCard.it Contact a health care provider if:  Your symptoms do not improve, even after treatment.  You have more discharge or pain when urinating.  You have a fever.  You have pain in your abdomen.  You have pain during sex.  You have vaginal bleeding between periods. Summary  Bacterial vaginosis is a vaginal infection that occurs when the normal balance of bacteria in the vagina is disrupted.  Because bacterial vaginosis increases your risk for STIs (sexually transmitted infections), getting treated can help reduce your risk for chlamydia, gonorrhea, herpes, and HIV (human immunodeficiency virus). Treatment is also important for preventing complications in pregnant women, because the condition can cause an early (premature) delivery.  This condition is treated with antibiotic medicines. These may be given as a pill, a vaginal cream, or a medicine that is put into the vagina (suppository). This information is not intended to replace advice given to you by your health care provider. Make sure you discuss any questions you have with your health care provider. Document Released: 09/13/2005 Document Revised: 05/29/2016 Document Reviewed: 05/29/2016 Elsevier Interactive Patient Education  2017 [employer]. No sex no alcohol during treatment Follow up in 2 weeks  Push fluids

## 2016-09-23 NOTE — Progress Notes (Signed)
Subjective:     Patient ID: Erica Lynch, female   DOB: April 25, 1961, 55 y.o.   MRN: [id number]  HPI Erica Lynch is a 55 year old white female, married in complaining of vaginal discomfort and burning with urination.Had sex last week and has burned and hurt since.It had been about 6 months since last sex.  Review of Systems Vaginal discomfort  Burning with urination Reviewed past medical,surgical, social and family history. Reviewed medications and allergies.     Objective:   Physical Exam BP 117/86 (BP Location: Left Arm, Patient Position: Sitting, Cuff Size: Normal)   Pulse 80   Ht 5\' 4"  (1.626 m)   Wt 196 lb 8 oz (89.1 kg)   LMP 10/15/2013   BMI 33.73 kg/m Urine trace blood and trace leuks.Skin warm and dry.Pelvic: external genitalia is normal in appearance no lesions, vagina: white discharge with slight odor,side walls red and has lost rugae,urethra has no lesions or masses noted, cervix:smooth, uterus: normal size, shape and contour, non tender, no masses felt, adnexa: no masses or tenderness noted. Bladder is non tender and no masses felt. Wet prep: + for clue cells and +WBCs. PHQ 9 score 15, is on meds and sees [name] at [facility], denies any suicidal ideations.    Assessment:     1. Burning with urination   2. Vaginal discharge   3. BV (bacterial vaginosis)   4. Hematuria, unspecified type   5. Depression, unspecified depression type       Plan:     Meds ordered this encounter  Medications  . metroNIDAZOLE (FLAGYL) 500 MG tablet    Sig: Take 1 tablet (500 mg total) by mouth 2 (two) times daily.    Dispense:  14 tablet    Refill:  0    Order Specific Question:   Supervising Provider    Answer:   Elonda Husky, LUTHER H [[id number]]  . sulfamethoxazole-trimethoprim (BACTRIM DS,SEPTRA DS) 800-160 MG tablet    Sig: Take 1 tablet by mouth 2 (two) times daily.    Dispense:  14 tablet    Refill:  0    Order Specific Question:   Supervising Provider    Answer:   Tania Ade H [[id number]]   UA C&S sent Push fluids  No sex or alcohol during treatment Review handout on BV Follow up in 2 weeks, will recheck tissue and may add vaginal estrogen

## 2016-09-24 LAB — URINALYSIS, ROUTINE W REFLEX MICROSCOPIC
BILIRUBIN UA: NEGATIVE
Glucose, UA: NEGATIVE
KETONES UA: NEGATIVE
LEUKOCYTES UA: NEGATIVE
Nitrite, UA: NEGATIVE
PH UA: 5.5 (ref 5.0–7.5)
PROTEIN UA: NEGATIVE
RBC UA: NEGATIVE
SPEC GRAV UA: 1.018 (ref 1.005–1.030)
UUROB: 0.2 mg/dL (ref 0.2–1.0)

## 2016-09-25 LAB — URINE CULTURE

## 2016-09-28 ENCOUNTER — Encounter (HOSPITAL_COMMUNITY): Payer: Self-pay | Admitting: Psychiatry

## 2016-09-28 ENCOUNTER — Ambulatory Visit (INDEPENDENT_AMBULATORY_CARE_PROVIDER_SITE_OTHER): Payer: BLUE CROSS/BLUE SHIELD | Admitting: Psychiatry

## 2016-09-28 DIAGNOSIS — F331 Major depressive disorder, recurrent, moderate: Secondary | ICD-10-CM | POA: Diagnosis not present

## 2016-09-28 NOTE — Progress Notes (Signed)
THERAPIST PROGRESS NOTE   Session Time:  Tuesday 09/28/2016 8:10 AM -  9:03 AM              Participation Level: Active  Behavioral Response: Well GroomedAlert/Angry  Type of Therapy: Individual Therapy  Treatment Goals addressed:   1. Learn and implement conflict resolution skills to resolve interpersonal problems       2. Identify and replace thoughts and beliefs that support depression and anxiety  Interventions: CBT   Summary: Erica Lynch is a 56 y.o. female who is referred for services by PCP Dr. Willey Blade due to patient experiencing symptoms of depression and anxiety. She is a returning patient to this clinician and last was seen in 2014. She initially was seen in this practice in 2012.  She states her emotions are out of control and  reports feeling angry, anxious, unhappy, and crying a lot. She says she feels as if she will never be happy again. She also  states she doesn't want to be around people. Her main stressor is her marriage. She and her husband began to experience increased problems in their marriage when son left for college 2 years ago. They have little in common now and often disagree about money and caring for thier possessions. Patient also reports discord related to her lack of interest in sex since she has been menopausal. She states being tired of picking up after her husband and adult son. She reports additional stress related to not settling deceased  father's estate , conflict with her middle sister, and issues at work.  Patient last was seen 4 weeks ago. She denies any symptoms of depression since last session but continues to experience moments of sadness regarding marriage. She expresses continued frustration with husband but says she has been trying to change demands to desires. However, she continues to experience anger and admits sometimes still using aggressive communication.    Suicidal/Homicidal: None   Therapist Response: Reviewed symptoms,  facilitated expression of feelings, assisted patient in examining her thought patterns regarding interactions with husband, assisted patient identify underlying feelings beneath her anger,  assisted patient identify ways to be assertive rather than aggressive in her communication with husband, discussed writing letter to husband to express feelings   Plan: Return again in 4 weeks.  Diagnosis: Axis I: Major depressive disorder, recurrent, moderate    Axis II: Deferred    Erica Concepcion, LCSW 09/28/2016

## 2016-09-29 DIAGNOSIS — F329 Major depressive disorder, single episode, unspecified: Secondary | ICD-10-CM | POA: Diagnosis not present

## 2016-09-29 DIAGNOSIS — F419 Anxiety disorder, unspecified: Secondary | ICD-10-CM | POA: Diagnosis not present

## 2016-09-29 DIAGNOSIS — Z79899 Other long term (current) drug therapy: Secondary | ICD-10-CM | POA: Diagnosis not present

## 2016-10-07 DIAGNOSIS — E785 Hyperlipidemia, unspecified: Secondary | ICD-10-CM | POA: Diagnosis not present

## 2016-10-07 DIAGNOSIS — F3340 Major depressive disorder, recurrent, in remission, unspecified: Secondary | ICD-10-CM | POA: Diagnosis not present

## 2016-10-07 DIAGNOSIS — Z6834 Body mass index (BMI) 34.0-34.9, adult: Secondary | ICD-10-CM | POA: Diagnosis not present

## 2016-10-08 ENCOUNTER — Ambulatory Visit: Payer: BLUE CROSS/BLUE SHIELD | Admitting: Adult Health

## 2016-10-14 ENCOUNTER — Ambulatory Visit: Payer: BLUE CROSS/BLUE SHIELD | Admitting: Adult Health

## 2016-10-20 ENCOUNTER — Encounter: Payer: Self-pay | Admitting: Adult Health

## 2016-10-20 ENCOUNTER — Ambulatory Visit (INDEPENDENT_AMBULATORY_CARE_PROVIDER_SITE_OTHER): Payer: BLUE CROSS/BLUE SHIELD | Admitting: Adult Health

## 2016-10-20 VITALS — BP 108/68 | HR 84 | Ht 64.0 in | Wt 195.5 lb

## 2016-10-20 DIAGNOSIS — N898 Other specified noninflammatory disorders of vagina: Secondary | ICD-10-CM | POA: Diagnosis not present

## 2016-10-20 DIAGNOSIS — F329 Major depressive disorder, single episode, unspecified: Secondary | ICD-10-CM | POA: Diagnosis not present

## 2016-10-20 DIAGNOSIS — F32A Depression, unspecified: Secondary | ICD-10-CM | POA: Insufficient documentation

## 2016-10-20 MED ORDER — ESTRADIOL 0.1 MG/GM VA CREA: TOPICAL_CREAM | VAGINAL | 0 refills | Status: DC

## 2016-10-20 NOTE — Progress Notes (Signed)
Subjective:     Patient ID: Erica Lynch, female   DOB: 1961/02/29, 56 y.o.   MRN: [id number]  HPI Erica Lynch is a 57 year old white female, married back in follow up of taking bactrim and flagyl for BV and burning urination, and is better.Has vaginal dryness, and sex hurts has not had since December.  PCP is Dr Willey Blade.  Review of Systems +vaginal dryness,sex hurts Reviewed past medical,surgical, social and family history. Reviewed medications and allergies.     Objective:   Physical Exam BP 108/68 (BP Location: Left Arm, Patient Position: Sitting, Cuff Size: Normal)   Pulse 84   Ht 5\' 4"  (1.626 m)   Wt 195 lb 8 oz (88.7 kg)   BMI 33.56 kg/m    Skin warm and dry.Pelvic: external genitalia is normal in appearance no lesions, vagina: pale and has loss of moisture and rugae,urethra has no lesions or masses noted, cervix:smooth, uterus: normal size, shape and contour, non tender, no masses felt, adnexa: no masses or tenderness noted. Bladder is non tender and no masses felt. PHQ 9 score 9, is on med and sees Clinical cytogeneticist at [facility]. Will try estrogen cream in vagina to see if helps, continue oral HRT.   Assessment:     1. Vaginal dryness   2. Depression, unspecified depression type       Plan:     Meds ordered this encounter  Medications  . estradiol (ESTRACE VAGINAL) 0.1 MG/GM vaginal cream    Sig: Use 1/2 to 1 gm daily for 2 weeks then 2-3 x weekly prn    Dispense:  48 g    Refill:  0    Order Specific Question:   Supervising Provider    Answer:   Tania Ade H [[id number]]  Follow up in 2 months for pap and physical    Use astro glide with sex

## 2016-10-29 ENCOUNTER — Ambulatory Visit (HOSPITAL_COMMUNITY): Payer: Self-pay | Admitting: Psychiatry

## 2016-11-10 ENCOUNTER — Telehealth (HOSPITAL_COMMUNITY): Payer: Self-pay | Admitting: *Deleted

## 2016-11-10 NOTE — Telephone Encounter (Signed)
left voice message, provider out of office 11/16/16. 

## 2016-11-15 DIAGNOSIS — J029 Acute pharyngitis, unspecified: Secondary | ICD-10-CM | POA: Diagnosis not present

## 2016-11-16 ENCOUNTER — Ambulatory Visit (HOSPITAL_COMMUNITY): Payer: Self-pay | Admitting: Psychiatry

## 2016-12-06 ENCOUNTER — Ambulatory Visit (INDEPENDENT_AMBULATORY_CARE_PROVIDER_SITE_OTHER): Payer: BLUE CROSS/BLUE SHIELD | Admitting: Psychiatry

## 2016-12-06 DIAGNOSIS — F331 Major depressive disorder, recurrent, moderate: Secondary | ICD-10-CM | POA: Diagnosis not present

## 2016-12-06 NOTE — Progress Notes (Signed)
THERAPIST PROGRESS NOTE   Session Time:  Monday 12/06/2016 8:15 AM -  9:02 AM              Participation Level: Active  Behavioral Response: Well GroomedAlert/Pleasant  Type of Therapy: Individual Therapy  Treatment Goals addressed:   1. Learn and implement conflict resolution skills to resolve interpersonal problems       2. Identify and replace thoughts and beliefs that support depression and anxiety  Interventions: CBT   Summary: Erica Lynch is a 56 y.o. female who is referred for services by PCP Dr. Willey Blade due to patient experiencing symptoms of depression and anxiety. She is a returning patient to this clinician and last was seen in 2014. She initially was seen in this practice in 2012.  She states her emotions are out of control and  reports feeling angry, anxious, unhappy, and crying a lot. She says she feels as if she will never be happy again. She also  states she doesn't want to be around people. Her main stressor is her marriage. She and her husband began to experience increased problems in their marriage when son left for college 2 years ago. They have little in common now and often disagree about money and caring for thier possessions. Patient also reports discord related to her lack of interest in sex since she has been menopausal. She states being tired of picking up after her husband and adult son. She reports additional stress related to not settling deceased  father's estate , conflict with her middle sister, and issues at work.  Patient last was seen about 8 weeks ago. She denies any symptoms of depression since last session. She reports using her spirituality to cope. She has been praying, reading the bible, and listening to sermons. She also continues to journal and is trying to maintain positive self-care. She is trying to focus more on self and identifying areas she can change. She expresses less sadness, anger, and frustration about marriage. She continues to  desire improvement in her marriage but no longer is experiencing intense anger. She reports increased stress and anxiety regarding work as this time of year can be stressful due to the demands at work. She reports difficulty staying asleep during the week due to worry about job.    Suicidal/Homicidal: None   Therapist Response: Reviewed symptoms, facilitated expression of feelings, assisted patient in examining her thought patterns, reviewed patient's common thinking errors and discussed challenging and replacing with healthy alternatives, discussed rationale for and practiced mindfulness exercise, assigned patient to practice mindfulness exercise.  Plan: Return again in 6 weeks.  Diagnosis: Axis I: Major depressive disorder, recurrent, moderate    Axis II: Deferred    Shaterra Sanzone, LCSW 12/06/2016

## 2016-12-21 ENCOUNTER — Ambulatory Visit (INDEPENDENT_AMBULATORY_CARE_PROVIDER_SITE_OTHER): Payer: BLUE CROSS/BLUE SHIELD | Admitting: Adult Health

## 2016-12-21 ENCOUNTER — Encounter: Payer: Self-pay | Admitting: Adult Health

## 2016-12-21 ENCOUNTER — Other Ambulatory Visit (HOSPITAL_COMMUNITY)
Admission: RE | Admit: 2016-12-21 | Discharge: 2016-12-21 | Disposition: A | Discharge status: Home / Self Care | Payer: BLUE CROSS/BLUE SHIELD | Source: Ambulatory Visit | Attending: Adult Health | Admitting: Adult Health

## 2016-12-21 VITALS — BP 110/60 | HR 68 | Ht 64.0 in | Wt 192.5 lb

## 2016-12-21 DIAGNOSIS — Z1212 Encounter for screening for malignant neoplasm of rectum: Secondary | ICD-10-CM | POA: Insufficient documentation

## 2016-12-21 DIAGNOSIS — N898 Other specified noninflammatory disorders of vagina: Secondary | ICD-10-CM

## 2016-12-21 DIAGNOSIS — Z1211 Encounter for screening for malignant neoplasm of colon: Secondary | ICD-10-CM | POA: Insufficient documentation

## 2016-12-21 DIAGNOSIS — Z01419 Encounter for gynecological examination (general) (routine) without abnormal findings: Secondary | ICD-10-CM

## 2016-12-21 DIAGNOSIS — R6882 Decreased libido: Secondary | ICD-10-CM

## 2016-12-21 DIAGNOSIS — Z79890 Hormone replacement therapy (postmenopausal): Secondary | ICD-10-CM

## 2016-12-21 LAB — HEMOCCULT GUIAC POC 1CARD (OFFICE): Fecal Occult Blood, POC: NEGATIVE

## 2016-12-21 MED ORDER — ESTRADIOL 0.1 MG/GM VA CREA: TOPICAL_CREAM | VAGINAL | 0 refills | Status: DC

## 2016-12-21 MED ORDER — ESTROGENS CONJUGATED 0.625 MG PO TABS: 0.6250 mg | ORAL_TABLET | Freq: Every day | ORAL | 12 refills | Status: DC

## 2016-12-21 MED ORDER — ESTRADIOL 0.1 MG/GM VA CREA: TOPICAL_CREAM | VAGINAL | 1 refills | Status: DC

## 2016-12-21 MED ORDER — PROGESTERONE MICRONIZED 200 MG PO CAPS: 200.0000 mg | ORAL_CAPSULE | Freq: Every day | ORAL | 12 refills | Status: DC

## 2016-12-21 NOTE — Progress Notes (Signed)
Patient ID: [name], female   DOB: Sep 15, 1961, 56 y.o.   MRN: [id number] History of Present Illness: Erica Lynch is a 56 year old white female,married in for a well woman gyn exam and pap. PCP is Dr Willey Blade.    Current Medications, Allergies, Past Medical History, Past Surgical History, Family History and Social History were reviewed in [employer] record.     Review of Systems: Patient denies any headaches, hearing loss, fatigue, blurred vision, shortness of breath, chest pain, abdominal pain, problems with bowel movements, urination, or intercourse(decreased libido). No joint pain or mood swings.    Physical Exam:BP 110/60 (BP Location: Left Arm, Patient Position: Sitting, Cuff Size: Normal)   Pulse 68   Ht 5\' 4"  (1.626 m)   Wt 192 lb 8 oz (87.3 kg)   BMI 33.04 kg/m  General:  Well developed, well nourished, no acute distress Skin:  Warm and dry Neck:  Midline trachea, normal thyroid, good ROM, no lymphadenopathy Lungs; Clear to auscultation bilaterally Breast:  No dominant palpable mass, retraction, or nipple discharge Cardiovascular: Regular rate and rhythm Abdomen:  Soft, non tender, no hepatosplenomegaly Pelvic:  External genitalia is normal in appearance, no lesions.  The vagina is normal in appearance. Urethra has no lesions or masses. The cervix is bulbous, and smooth, pap with HPV performed, at her request.  Uterus is felt to be normal size, shape, and contour.  No adnexal masses or tenderness noted.Bladder is non tender, no masses felt. Rectal: Good sphincter tone, no polyps, or hemorrhoids felt.  Hemoccult negative. Extremities/musculoskeletal:  No swelling or varicosities noted, no clubbing or cyanosis Psych:  No mood changes, alert and cooperative,seems happy PHQ 2 score 0.  Impression: 1. Encounter for gynecological examination with Papanicolaou smear of cervix   2. Hormone replacement therapy (HRT)   3. Screening for colorectal cancer   4.  Decreased libido   5. Vaginal dryness       Plan: Refilled premarin 0.625 mg #30 take 1 daily with 12 refills Refilled prometrium 200 mg take 1 at HS, #30 with 12 refills Refill estrace cream to use 2-3 x weekly Physical in 1 year  Pap in 3 if normal Mammogram yearly Labs with PCP  Use astroglide with sex and increased foreplay Keep communications open

## 2016-12-22 LAB — CYTOLOGY - PAP
DIAGNOSIS: NEGATIVE
HPV: NOT DETECTED

## 2017-01-05 DIAGNOSIS — Z79899 Other long term (current) drug therapy: Secondary | ICD-10-CM | POA: Diagnosis not present

## 2017-01-05 DIAGNOSIS — I10 Essential (primary) hypertension: Secondary | ICD-10-CM | POA: Diagnosis not present

## 2017-01-06 DIAGNOSIS — M722 Plantar fascial fibromatosis: Secondary | ICD-10-CM | POA: Diagnosis not present

## 2017-01-06 DIAGNOSIS — Q664 Congenital talipes calcaneovalgus: Secondary | ICD-10-CM | POA: Diagnosis not present

## 2017-01-06 DIAGNOSIS — M79672 Pain in left foot: Secondary | ICD-10-CM | POA: Diagnosis not present

## 2017-01-10 ENCOUNTER — Encounter (HOSPITAL_COMMUNITY): Payer: Self-pay | Admitting: Emergency Medicine

## 2017-01-10 ENCOUNTER — Emergency Department (HOSPITAL_COMMUNITY)
Admission: EM | Admit: 2017-01-10 | Discharge: 2017-01-10 | Disposition: A | Discharge status: Home / Self Care | Payer: BLUE CROSS/BLUE SHIELD | Attending: Emergency Medicine | Admitting: Emergency Medicine

## 2017-01-10 ENCOUNTER — Emergency Department (HOSPITAL_COMMUNITY): Payer: BLUE CROSS/BLUE SHIELD

## 2017-01-10 DIAGNOSIS — W208XXA Other cause of strike by thrown, projected or falling object, initial encounter: Secondary | ICD-10-CM | POA: Diagnosis not present

## 2017-01-10 DIAGNOSIS — Y929 Unspecified place or not applicable: Secondary | ICD-10-CM | POA: Insufficient documentation

## 2017-01-10 DIAGNOSIS — S0181XA Laceration without foreign body of other part of head, initial encounter: Secondary | ICD-10-CM | POA: Insufficient documentation

## 2017-01-10 DIAGNOSIS — Y999 Unspecified external cause status: Secondary | ICD-10-CM | POA: Diagnosis not present

## 2017-01-10 DIAGNOSIS — S0990XA Unspecified injury of head, initial encounter: Secondary | ICD-10-CM | POA: Diagnosis not present

## 2017-01-10 DIAGNOSIS — S0003XA Contusion of scalp, initial encounter: Secondary | ICD-10-CM | POA: Diagnosis not present

## 2017-01-10 DIAGNOSIS — S0191XA Laceration without foreign body of unspecified part of head, initial encounter: Secondary | ICD-10-CM | POA: Diagnosis not present

## 2017-01-10 DIAGNOSIS — Y939 Activity, unspecified: Secondary | ICD-10-CM | POA: Diagnosis not present

## 2017-01-10 NOTE — Discharge Instructions (Signed)
Take tylenol or ibuprofen as needed for pain. Return for worsening symptoms.

## 2017-01-10 NOTE — ED Provider Notes (Signed)
[facility] DEPT Provider Note   CSN: [id number] Arrival date & time: 01/10/17  1508  By signing my name below, I, Collene Leyden, attest that this documentation has been prepared under the direction and in the presence of Debroah Baller, NP. Electronically Signed: Collene Leyden, Scribe. 01/10/17. 6:10 PM.  History   Chief Complaint Chief Complaint  Patient presents with  . Head Injury   HPI Comments: GERMANI Lynch is a 56 y.o. female with no pertinent medical who presents to the Emergency Department complaining of a sudden-onset head injury that happened earlier today. Patient states a shelf fell off of a wall above her desk today and hit her on the head. Patient was seen at urgent care earlier today, in which her head laceration was repaired. Patient was advised to come here after developing visual changes. Patient reports associated visual changes (Spots) that have now resolved. No modifying factors indicated. Patient denies an loss of consciousness, nausea, vomiting, numbness, weakness, or any other symptoms.   The history is provided by the patient. No language interpreter was used.    Past Medical History:  Diagnosis Date  . Anxiety   . Depression   . Fatigue 12/04/2015  . Hematuria 05/22/2014  . History of breast lump 02/19/2014  . Hot flashes 12/04/2015  . Hyperlipidemia   . IBS (irritable bowel syndrome)   . Left breast mass 11/07/2013  . LLQ pain 05/22/2014  . Lumbar herniated disc November 2016  . Other and unspecified ovarian cyst 05/23/2014  . PONV (postoperative nausea and vomiting)   . Unspecified symptom associated with female genital organs 05/22/2014  . Vitamin D deficiency 12/05/2015    Patient Active Problem List   Diagnosis Date Noted  . Vaginal dryness 10/20/2016  . Depression 10/20/2016  . Vitamin D deficiency 12/05/2015  . Hot flashes 12/04/2015  . Fatigue 12/04/2015  . Major depression 06/17/2015  . Breast calcifications on mammogram 11/27/2014  . Back  pain with right-sided radiculopathy 10/08/2014  . 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 Other and unspecified ovarian cyst 05/23/2014  . Unspecified symptom associated with female genital organs 05/22/2014  . Hematuria 05/22/2014  . LLQ pain 05/22/2014  . History of breast lump 02/19/2014  . Left breast mass 11/07/2013  . Screen for colon cancer 08/04/2011    Past Surgical History:  Procedure Laterality Date  . CESAREAN SECTION  1996  . COLONOSCOPY  08/2011   Dr. Gala Romney: normal  . ENDOMETRIAL ABLATION  2-3 yrs ago   [facility]  . OTHER SURGICAL HISTORY     Uterine ablation  . TUBAL LIGATION  6 yrs ago   [facility]    OB History    Gravida Para Term Preterm AB Living   1 1 1     1    SAB TAB Ectopic Multiple Live Births           1       Home Medications    Prior to Admission medications   Medication Sig Start Date End Date Taking? Authorizing Provider  buPROPion (WELLBUTRIN XL) 150 MG 24 hr tablet Take 150 mg by mouth daily.    Historical Provider, MD  Cholecalciferol 5000 units capsule Take 1 capsule (5,000 Units total) by mouth daily. 12/05/15   Estill Dooms, NP  clonazePAM (KLONOPIN) 1 MG tablet Take 1 tablet (1 mg total) by mouth 2 (two) times daily as needed for anxiety. 09/13/16   Cloria Spring, MD  estradiol (ESTRACE VAGINAL) 0.1 MG/GM vaginal cream Use  1/2 to 1 gm daily for 2 weeks then 2-3 x weekly prn 12/21/16   Estill Dooms, NP  estrogens, conjugated, (PREMARIN) 0.625 MG tablet Take 1 tablet (0.625 mg total) by mouth daily. 12/21/16   Estill Dooms, NP  progesterone (PROMETRIUM) 200 MG capsule Take 1 capsule (200 mg total) by mouth daily. 12/21/16   Estill Dooms, NP    Family History Family History  Problem Relation Age of Onset  . Lung cancer Mother   . Breast cancer Mother   . Cancer Mother     breast  . Depression Mother   . Cirrhosis Father     etoh   . Anxiety disorder Father   . Alcohol abuse Father   . Depression Father   .  Diabetes Maternal Grandfather   . Heart disease Maternal Grandfather   . Depression Maternal Grandmother   . Depression Maternal Uncle   . Depression Maternal Uncle   . Anesthesia problems Neg Hx   . Hypotension Neg Hx   . Malignant hyperthermia Neg Hx   . Pseudochol deficiency Neg Hx     Social History Social History  Substance Use Topics  . Smoking status: Never Smoker  . Smokeless tobacco: Never Used  . Alcohol use Yes     Comment: glass of wine once a month sometime not at all     Allergies   Patient has no known allergies.   Review of Systems Review of Systems  Constitutional: Negative for activity change.  Eyes: Visual disturbance: Spotted vision that has resolved.  Respiratory: Negative for shortness of breath.   Gastrointestinal: Negative for nausea and vomiting.  Musculoskeletal: Negative for neck pain.  Skin: Positive for wound.       Laceration to he right forehead.   Neurological: Positive for headaches. Negative for weakness and numbness.  Psychiatric/Behavioral: Negative for confusion.     Physical Exam Updated Vital Signs BP 116/89 (BP Location: Right Arm)   Pulse 80   Temp 97.8 F (36.6 C) (Oral)   Resp 20   Ht 5\' 4"  (1.626 m)   Wt 87.5 kg   SpO2 98%   BMI 33.13 kg/m   Physical Exam  Constitutional: She is oriented to person, place, and time. She appears well-developed and well-nourished.  HENT:  Right Ear: Tympanic membrane and external ear normal.  Left Ear: Tympanic membrane and external ear normal.  Nose: No nasal septal hematoma. No epistaxis.  Uvula midline. No edema or erythema. No dental injuries. 1 cm laceration to the right forehead.   Eyes: Conjunctivae and EOM are normal. Pupils are equal, round, and reactive to light. No scleral icterus.  Neck: Normal range of motion. Neck supple.  Cardiovascular: Normal rate and regular rhythm.   Pulmonary/Chest: Effort normal and breath sounds normal.  Musculoskeletal: Normal range of  motion.  Neurological: She is alert and oriented to person, place, and time. No cranial nerve deficit.  Good visual field. Pulses are equal. Steady gait, no foot drag. Stands on one foot without difficulty. 5/5 grip strength. Negative Romberg.   Skin: Skin is warm and dry.  Psychiatric: She has a normal mood and affect. Her behavior is normal. Thought content normal.  Nursing note and vitals reviewed.    ED Treatments / Results  DIAGNOSTIC STUDIES: Oxygen Saturation is 96% on RA, adequate by my interpretation.    COORDINATION OF CARE: 6:06 PM Discussed treatment plan with pt at bedside and pt agreed to plan, which includes tylenol and  sterilization of the laceration.   Labs (all labs ordered are listed, but only abnormal results are displayed) Labs Reviewed - No data to display  Radiology Ct Head Wo Contrast  Result Date: 01/10/2017 CLINICAL DATA:  Show fell on head today.  Laceration. EXAM: CT HEAD WITHOUT CONTRAST TECHNIQUE: Contiguous axial images were obtained from the base of the skull through the vertex without intravenous contrast. COMPARISON:  None. FINDINGS: Brain: The ventricles are normal in size and configuration. No extra-axial fluid collections are identified. The gray-white differentiation is normal. No CT findings for acute intracranial process such as hemorrhage or infarction. No mass lesions. The brainstem and cerebellum are grossly normal. Vascular: No hyperdense vessel or unexpected calcification. Skull: Normal. Negative for fracture or focal lesion. Sinuses/Orbits: The paranasal sinuses and mastoid air cells are clear. The globes are intact. Other: No scalp hematoma. IMPRESSION: No acute intracranial findings or skull fracture. Electronically Signed   By: Marijo Sanes M.D.   On: 01/10/2017 16:17    Procedures Procedures (including critical care time)  Medications Ordered in ED Medications - No data to display   Initial Impression / Assessment and Plan / ED  Course  I have reviewed the triage vital signs and the nursing notes.  Pertinent  imaging results that were available during my care of the patient were reviewed by me and considered in my medical decision making (see chart for details).   Final Clinical Impressions(s) / ED Diagnoses   MDM: Patient symptoms consistent with concussion. No vomiting. No focal neurological deficits on physical exam.  Pt observed in the ED.  CT head is negative. Discussed symptoms of post concussive syndrome and reasons to return to the emergency department including any new severe headaches, disequilibrium, vomiting, double vision, extremity weakness, difficulty ambulating, or any other concerning symptoms. Patient will be discharged with information pertaining to diagnosis. Pt is safe for discharge at this time.   Final diagnoses:  Facial laceration, initial encounter  Contusion of scalp, initial encounter    New Prescriptions Discharge Medication List as of 01/10/2017  6:10 PM     I personally performed the services described in this documentation, which was scribed in my presence. The recorded information has been reviewed and is accurate.     [address] [place], NP 01/10/17 [place], MD 01/12/17 [phone]

## 2017-01-10 NOTE — ED Triage Notes (Signed)
Pt reports a shelf falling off of the wall above her desk today at lunch.  Went to urgent care to get lac repaired and told them she was seeing spots and they sent here for eval.

## 2017-01-12 ENCOUNTER — Ambulatory Visit (INDEPENDENT_AMBULATORY_CARE_PROVIDER_SITE_OTHER): Payer: BLUE CROSS/BLUE SHIELD | Admitting: Psychiatry

## 2017-01-12 ENCOUNTER — Encounter (HOSPITAL_COMMUNITY): Payer: Self-pay | Admitting: Psychiatry

## 2017-01-12 VITALS — BP 100/76 | HR 78 | Ht 64.0 in | Wt 191.0 lb

## 2017-01-12 DIAGNOSIS — F331 Major depressive disorder, recurrent, moderate: Secondary | ICD-10-CM | POA: Diagnosis not present

## 2017-01-12 DIAGNOSIS — Z79899 Other long term (current) drug therapy: Secondary | ICD-10-CM | POA: Diagnosis not present

## 2017-01-12 DIAGNOSIS — Z818 Family history of other mental and behavioral disorders: Secondary | ICD-10-CM

## 2017-01-12 DIAGNOSIS — Z811 Family history of alcohol abuse and dependence: Secondary | ICD-10-CM

## 2017-01-12 MED ORDER — BUPROPION HCL ER (XL) 150 MG PO TB24: 150.0000 mg | ORAL_TABLET | Freq: Every day | ORAL | 5 refills | Status: DC

## 2017-01-12 MED ORDER — CLONAZEPAM 1 MG PO TABS: 1.0000 mg | ORAL_TABLET | Freq: Two times a day (BID) | ORAL | 5 refills | Status: DC | PRN

## 2017-01-12 NOTE — Progress Notes (Signed)
Patient ID: Erica Lynch, female   DOB: August 29, 1961, 56 y.o.   MRN: [id number] Patient ID: Erica Lynch, female   DOB: 01-20-1961, 56 y.o.   MRN: [id number] Patient ID: Erica Lynch, female   DOB: 1961/08/22, 56 y.o.   MRN: [id number] Patient ID: Erica Lynch, female   DOB: 1961/09/14, 56 y.o.   MRN: [id number]  Psychiatric Initial Adult Assessment   Patient Identification: Erica Lynch MRN:  [id number] Date of Evaluation:  01/12/2017 Referral Source: Maurice Small MSW Chief Complaint:   Chief Complaint    Depression; Anxiety; Follow-up     Visit Diagnosis:    ICD-9-CM ICD-10-CM   1. Major depressive disorder, recurrent episode, moderate (HCC) 296.32 F33.1    Diagnosis:   Patient Active Problem List   Diagnosis Date Noted  . Vaginal dryness [N89.8] 10/20/2016  . Depression [F32.9] 10/20/2016  . Vitamin D deficiency [E55.9] 12/05/2015  . Hot flashes [R23.2] 12/04/2015  . Fatigue [R53.83] 12/04/2015  . Major depression [F32.9] 06/17/2015  . Breast calcifications on mammogram [R92.1] 11/27/2014  . Back pain with right-sided radiculopathy [M54.10] 10/08/2014  . 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 Other and unspecified ovarian cyst [N83.209] 05/23/2014  . Unspecified symptom associated with female genital organs [N94.9] 05/22/2014  . Hematuria [R31.9] 05/22/2014  . LLQ pain [R10.32] 05/22/2014  . History of breast lump [Z87.898] 02/19/2014  . Left breast mass [N63.20] 11/07/2013  . Screen for colon cancer [Z12.11] 08/04/2011   History of Present Illness:  This patient is a 56 year old married white female who lives with her husband and 12 year old son in [place]. 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  The patient was originally referred by her primary care physician, Dr. Willey Blade, to see Maurice Small for counseling. She in turn has referred her to me for medication management of depression and anxiety.  The patient states that her mood  problems started during the first year after she had her son 20 years ago. She felt sad down and was having crying spells. She was also having marital problems. At the time her husband was acting in an immature fashion, going out with friends a lot drinking and leaving her alone with the baby. She never had treatment for this at the time.  Over the years her depression worsened at times. Her mother died in 2006/01/05 from cancer which caused her to feel worse. Her primary physician put her on Zoloft and later Cymbalta and most recently on Prozac. Her father was an alcoholic who died in 6063 from complications of alcoholism. In Jan 05, 2013 she decided try going off Prozac but got very depressed without it and went back on. She's now on accommodation of Prozac and Wellbutrin.  Despite the 2 antidepressants the patient feels sad all the time. Her energy isn't motivation are low. She states that she is able to function at home and work but just doesn't enjoy anything. She sleeps but only when she takes clonazepam at bedtime. Her marriage is still not going well and she feels that she and her husband have little in common. He still doesn't help much around the house and would rather spend time with his mother than a dinner with her. She feels alone much of the time. She does have one close friend that she does things with. She tries to exercise 2-3 times a week. She has never been suicidal has never had psychotic symptoms or previous psychiatric treatment.  The patient returns after 4 months.  She is doing very well. She went down on the Wellbutrin XL to 150 mg and actually feels better on this dosage. Her mood has been good her energy is good and she staying very busy at her job. The clonazepam helps her sleep and occasionally she takes one during the day due to anxiety at work. She has no specific complaints today   Elements:  Location:  Global. Quality:  Moderate. Severity:  Moderate. Timing:  Daily. Duration:  20  years. Context:  Marital problems, loss of parents. Associated Signs/Symptoms: Depression Symptoms:  depressed mood, anhedonia, psychomotor retardation, fatigue, difficulty concentrating, anxiety, loss of energy/fatigue, (Hypo) Manic Symptoms:  Irritable Mood, Anxiety Symptoms:  Excessive Worry,   Past Medical History:  Past Medical History:  Diagnosis Date  . Anxiety   . Depression   . Fatigue 12/04/2015  . Hematuria 05/22/2014  . History of breast lump 02/19/2014  . Hot flashes 12/04/2015  . Hyperlipidemia   . IBS (irritable bowel syndrome)   . Left breast mass 11/07/2013  . LLQ pain 05/22/2014  . Lumbar herniated disc November 2016  . Other and unspecified ovarian cyst 05/23/2014  . PONV (postoperative nausea and vomiting)   . Unspecified symptom associated with female genital organs 05/22/2014  . Vitamin D deficiency 12/05/2015    Past Surgical History:  Procedure Laterality Date  . CESAREAN SECTION  1996  . COLONOSCOPY  08/2011   Dr. Gala Romney: normal  . ENDOMETRIAL ABLATION  2-3 yrs ago   [facility]  . OTHER SURGICAL HISTORY     Uterine ablation  . TUBAL LIGATION  6 yrs ago   [facility]   Family History:  Family History  Problem Relation Age of Onset  . Lung cancer Mother   . Breast cancer Mother   . Cancer Mother     breast  . Depression Mother   . Cirrhosis Father     etoh   . Anxiety disorder Father   . Alcohol abuse Father   . Depression Father   . Diabetes Maternal Grandfather   . Heart disease Maternal Grandfather   . Depression Maternal Grandmother   . Depression Maternal Uncle   . Depression Maternal Uncle   . Anesthesia problems Neg Hx   . Hypotension Neg Hx   . Malignant hyperthermia Neg Hx   . Pseudochol deficiency Neg Hx    Social History:   Social History   Social History  . Marital status: Married    Spouse name: N/A  . Number of children: 1  . Years of education: N/A   Occupational History  . [employer]    Social History Main Topics  . Smoking status: Never Smoker  . Smokeless tobacco: Never Used  . Alcohol use Yes     Comment: glass of wine once a month sometime not at all  . Drug use: No  . Sexual activity: Yes    Birth control/ protection: Surgical     Comment: tubal and ablation   Other Topics Concern  . None   Social History Narrative   1 son-16         Additional Social History: The patient grew up in [place] with both parents. She has 2 sisters. She's never been the victim of any trauma or abuse. She has a 2 year college degree. 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  Her brother-in-law now owns the store.  Musculoskeletal: Strength & Muscle Tone: within normal limits Gait & Station: normal Patient leans: N/A  Psychiatric Specialty Exam: Depression         Past medical history includes anxiety.   Anxiety  Symptoms include nervous/anxious behavior.      Review of Systems  Psychiatric/Behavioral: Positive for depression. The patient is nervous/anxious.   All other systems reviewed and are negative.   Blood pressure 100/76, pulse 78, height 5\' 4"  (1.626 m), weight 191 lb (86.6 kg), SpO2 93 %.Body mass index is 32.79 kg/m.  General Appearance: Casual, Neat and Well Groomed  Eye Contact:  Good  Speech:  Clear and Coherent  Volume:  Normal  Mood: good   Affect: Bright   Thought Process:  Goal Directed  Orientation:  Full (Time, Place, and Person)  Thought Content:  Rumination  Suicidal Thoughts:  No  Homicidal Thoughts:  No  Memory:  Immediate;   Good Recent;   Good Remote;   Good  Judgement:  Good  Insight:  Good  Psychomotor Activity:  Normal  Concentration:  Fair  Recall:  Good  Fund of Knowledge:Good  Language: Good  Akathisia:  No  Handed:  Right  AIMS (if indicated):    Assets:  Communication Skills Desire for Improvement Physical  Health Resilience Social Support Talents/Skills  ADL's:  Intact  Cognition: WNL  Sleep:  ok   Is the patient at risk to self?  No. Has the patient been a risk to self in the past 6 months?  No. Has the patient been a risk to self within the distant past?  No. Is the patient a risk to others?  No. Has the patient been a risk to others in the past 6 months?  No. Has the patient been a risk to others within the distant past?  No.  Allergies:  No Known Allergies Current Medications: Current Outpatient Prescriptions  Medication Sig Dispense Refill  . buPROPion (WELLBUTRIN XL) 150 MG 24 hr tablet Take 1 tablet (150 mg total) by mouth daily. 30 tablet 5  . Cholecalciferol 5000 units capsule Take 1 capsule (5,000 Units total) by mouth daily.    . clonazePAM (KLONOPIN) 1 MG tablet Take 1 tablet (1 mg total) by mouth 2 (two) times daily as needed for anxiety. 60 tablet 5  . estradiol (ESTRACE VAGINAL) 0.1 MG/GM vaginal cream Use 1/2 to 1 gm daily for 2 weeks then 2-3 x weekly prn 42.5 g 1  . estrogens, conjugated, (PREMARIN) 0.625 MG tablet Take 1 tablet (0.625 mg total) by mouth daily. 30 tablet 12  . progesterone (PROMETRIUM) 200 MG capsule Take 1 capsule (200 mg total) by mouth daily. 30 capsule 12   No current facility-administered medications for this visit.     Previous Psychotropic Medications: Yes   Substance Abuse History in the last 12 months:  No.  Consequences of Substance Abuse: NA  Medical Decision Making:  Review of Psycho-Social Stressors (1), Review or order clinical lab tests (1), Review and summation of old records (2), Established Problem, Worsening (2), Review of Medication Regimen & Side Effects (2) and Review of New Medication or Change in Dosage (2)  Treatment Plan Summary: Medication management   The patient will continue Wellbutrin XL 150 mg daily. She'll continue clonazepam as bedtime as needed for anxiety. Carlene Coria return in 6 months    [place],  [facility] 4/18/20188:41 AM Patient ID: SEVERINA SYKORA, female   DOB: 1961/02/20, 56 y.o.  MRN: [id number]

## 2017-01-13 DIAGNOSIS — F339 Major depressive disorder, recurrent, unspecified: Secondary | ICD-10-CM | POA: Diagnosis not present

## 2017-01-17 ENCOUNTER — Ambulatory Visit (HOSPITAL_COMMUNITY): Payer: Self-pay | Admitting: Psychiatry

## 2017-01-20 DIAGNOSIS — M79672 Pain in left foot: Secondary | ICD-10-CM | POA: Diagnosis not present

## 2017-01-20 DIAGNOSIS — Q664 Congenital talipes calcaneovalgus: Secondary | ICD-10-CM | POA: Diagnosis not present

## 2017-01-20 DIAGNOSIS — M722 Plantar fascial fibromatosis: Secondary | ICD-10-CM | POA: Diagnosis not present

## 2017-01-31 ENCOUNTER — Encounter (HOSPITAL_COMMUNITY): Payer: Self-pay | Admitting: Psychiatry

## 2017-01-31 ENCOUNTER — Ambulatory Visit (INDEPENDENT_AMBULATORY_CARE_PROVIDER_SITE_OTHER): Payer: BLUE CROSS/BLUE SHIELD | Admitting: Psychiatry

## 2017-01-31 DIAGNOSIS — F331 Major depressive disorder, recurrent, moderate: Secondary | ICD-10-CM | POA: Diagnosis not present

## 2017-01-31 NOTE — Progress Notes (Signed)
THERAPIST PROGRESS NOTE   Session Time:  Monday 01/31/2017 8:12 AM - 9:06 AM              Participation Level: Active  Behavioral Response: Well GroomedAlert/Pleasant  Type of Therapy: Individual Therapy  Treatment Goals addressed:   1. Learn and implement conflict resolution skills to resolve interpersonal prob        lems       2. Identify and replace thoughts and beliefs that support depression and anxiety  Interventions: CBT   Summary: Erica Lynch is a 56 y.o. female who is referred for services by PCP Dr. Willey Blade due to patient experiencing symptoms of depression and anxiety. She is a returning patient to this clinician and last was seen in 2014. She initially was seen in this practice in 2012.  She states her emotions are out of control and  reports feeling angry, anxious, unhappy, and crying a lot. She says she feels as if she will never be happy again. She also  states she doesn't want to be around people. Her main stressor is her marriage. She and her husband began to experience increased problems in their marriage when son left for college 2 years ago. They have little in common now and often disagree about money and caring for thier possessions. Patient also reports discord related to her lack of interest in sex since she has been menopausal. She states being tired of picking up after her husband and adult son. She reports additional stress related to not settling deceased  father's estate , conflict with her middle sister, and issues at work.  Patient last was seen about 8 weeks ago. She reports doing well since last session. She has maintained consistent positive self-care. She goes gym twice a week. She also has been engaging in a variety of activities as well as using her spirituality to cope. She is pleased she has started attending a different church. She reports initiating efforts to spend more time with her husband and says this has had some positive results. She  reports increased frustration, anger, and resentment regarding her job as she reports being short staffed and owners have not hired additional help.   Suicidal/Homicidal: None   Therapist Response: Reviewed symptoms, facilitated expression of feelings, praised and reinforced patient's continued positive self-care and use of healthy coping skills, reviewed the role of cognitive biases in depression, discussed connection between thoughts/mood/behavior using examples from patient's life and handout on cognitive model, discussed and reviewed common cognitive distortions, introduced and and provided instructions for completing automatic thought log, assigned patient to complete and bring to next session.  Plan: Return again in 2 weeks.  Diagnosis: Axis I: Major depressive disorder, recurrent, moderate    Axis II: Deferred    Benjimen Kelley, LCSW 01/31/2017

## 2017-02-02 DIAGNOSIS — H11421 Conjunctival edema, right eye: Secondary | ICD-10-CM | POA: Diagnosis not present

## 2017-02-17 ENCOUNTER — Ambulatory Visit (INDEPENDENT_AMBULATORY_CARE_PROVIDER_SITE_OTHER): Payer: BLUE CROSS/BLUE SHIELD | Admitting: Psychiatry

## 2017-02-17 ENCOUNTER — Encounter (HOSPITAL_COMMUNITY): Payer: Self-pay | Admitting: Psychiatry

## 2017-02-17 DIAGNOSIS — F331 Major depressive disorder, recurrent, moderate: Secondary | ICD-10-CM | POA: Diagnosis not present

## 2017-02-17 NOTE — Progress Notes (Signed)
THERAPIST PROGRESS NOTE   Session Time:  Thursday 02/17/2017  8:10 AM - 9:10 AM                   Participation Level: Active  Behavioral Response: Well GroomedAlert/Pleasant  Type of Therapy: Individual Therapy  Treatment Goals addressed:   1. Learn and implement conflict resolution skills to resolve interpersonal problems       2. Identify and replace thoughts and beliefs that support depression and anxiety  Interventions: CBT   Summary: Erica Lynch is a 56 y.o. female who is referred for services by PCP Dr. Willey Blade due to patient experiencing symptoms of depression and anxiety. She is a returning patient to this clinician and last was seen in 2014. She initially was seen in this practice in 2012.  She states her emotions are out of control and  reports feeling angry, anxious, unhappy, and crying a lot. She says she feels as if she will never be happy again. She also  states she doesn't want to be around people. Her main stressor is her marriage. She and her husband began to experience increased problems in their marriage when son left for college 2 years ago. They have little in common now and often disagree about money and caring for thier possessions. Patient also reports discord related to her lack of interest in sex since she has been menopausal. She states being tired of picking up after her husband and adult son. She reports additional stress related to not settling deceased  father's estate , conflict with her middle sister, and issues at work.  Patient last was seen about 3 weeks ago. She reports feeling better since last session.  She has maintained consistent positive self-care. She goes to gym twice a week. She continues to  engage in a variety of activities. She continues to nurture her spirituality attending church regularly  and reports including night time ritual of studying scripture just prior to going to bed. She reports she and husband getting along fairly well  but wanting a closer relationship with husband. She reports good relationship with son. She continues to express frustration regarding job but has accepted she can't change areas beyond her control. She reports feeling some relief about work as she has decided to take early retirement in 74 years when she will be 24. She reports researching her benefits and discussing with husband who is supportive. She completed homework and reports feeling more in control as she has become more aware of thoughts.   Suicidal/Homicidal: None   Therapist Response: Reviewed symptoms, facilitated expression of feelings, praised and reinforced patient's continued positive self-care and use of healthy coping skills, praised patient's efforts in completing her homework, assisted patient identify the effects keeping the thought log, reviewed the role of cognitive biases in depression, reviewed  connection between thoughts/mood/behavior using examples from patient's thought log, assisted patient  identify specific cognitive distortions in her thinking patterns using thought log  which include frequent should, should, ought statements, discussed the role of feelings in examining mood/thoughts, provided instructions for completing simple thought/feelings log, assigned patient to complete log and bring to next session    Plan: Return again in 2 weeks.  Diagnosis: Axis I: Major depressive disorder, recurrent, moderate    Axis II: Deferred    Malekai Markwood, LCSW 02/17/2017

## 2017-03-03 ENCOUNTER — Ambulatory Visit (INDEPENDENT_AMBULATORY_CARE_PROVIDER_SITE_OTHER): Payer: BLUE CROSS/BLUE SHIELD | Admitting: Psychiatry

## 2017-03-03 ENCOUNTER — Encounter (HOSPITAL_COMMUNITY): Payer: Self-pay | Admitting: Psychiatry

## 2017-03-03 DIAGNOSIS — F331 Major depressive disorder, recurrent, moderate: Secondary | ICD-10-CM | POA: Diagnosis not present

## 2017-03-03 NOTE — Progress Notes (Addendum)
THERAPIST PROGRESS NOTE   Session Time:  Thursday 03/03/2017 8:14 AM - 9:10 AM              Participation Level: Active  Behavioral Response: Well GroomedAlert/Pleasant  Type of Therapy: Individual Therapy  Treatment Goals addressed:   1. Learn and implement conflict resolution skills to resolve interpersonal problems       2. Identify and replace thoughts and beliefs that support depression and anxiety  Interventions: CBT   Summary: Erica Lynch is a 56 y.o. female who is referred for services by PCP Dr. Willey Blade due to patient experiencing symptoms of depression and anxiety. She is a returning patient to this clinician and last was seen in 2014. She initially was seen in this practice in 2012.  She states her emotions are out of control and  reports feeling angry, anxious, unhappy, and crying a lot. She says she feels as if she will never be happy again. She also  states she doesn't want to be around people. Her main stressor is her marriage. She and her husband began to experience increased problems in their marriage when son left for college 2 years ago. They have little in common now and often disagree about money and caring for thier possessions. Patient also reports discord related to her lack of interest in sex since she has been menopausal. She states being tired of picking up after her husband and adult son. She reports additional stress related to not settling deceased  father's estate , conflict with her middle sister, and issues at work.  Patient last was seen about 3 weeks ago. She denies having any periods of depression but reports increased anger and anxiety. She reports stress and continued frustration regarding work due to recent incidents. . She states waking up about once per week and being unable to go back to sleep due to thoughts about work. She has been using the thought log and and has been able to identify some of her thoughts and feelings regarding this. She  continues to maintain positive self-care regarding eating patterns and going to gym. However, she just learned her trainer will be unavailable indefinitely.  She continues to nurture spirituality.    Suicidal/Homicidal: None   Therapist Response: Reviewed symptoms, facilitated expression of feelings, praised and reinforced patient's continued positive self-care, assisted her identify ways to maintain positive self-care regarding exercise, praised and reinforced  patient's efforts in completing her homework,  reviewed the role of cognitive biases in depression, reviewed  connection between thoughts/mood/behavior using examples from patient's thought log, assisted patient  identify specific cognitive distortions in her thinking patterns using thought log  which include frequent should, should, ought statements, assisted patient identify underlying feelings beneath anger, discussed the role of feelings in examining mood/thoughts as well as using feelings for guide for action, assigned patient to continue using simple thought log and bring to next session    Plan: Return again in 2 weeks.  Diagnosis: Axis I: Major depressive disorder, recurrent, moderate    Axis II: Deferred    Lavance Beazer, LCSW 03/03/2017

## 2017-03-10 DIAGNOSIS — M722 Plantar fascial fibromatosis: Secondary | ICD-10-CM | POA: Diagnosis not present

## 2017-03-10 DIAGNOSIS — Q664 Congenital talipes calcaneovalgus: Secondary | ICD-10-CM | POA: Diagnosis not present

## 2017-03-10 DIAGNOSIS — M79672 Pain in left foot: Secondary | ICD-10-CM | POA: Diagnosis not present

## 2017-03-17 ENCOUNTER — Ambulatory Visit (HOSPITAL_COMMUNITY): Payer: Self-pay | Admitting: Psychiatry

## 2017-03-18 ENCOUNTER — Ambulatory Visit (HOSPITAL_COMMUNITY): Payer: Self-pay | Admitting: Psychiatry

## 2017-03-23 ENCOUNTER — Ambulatory Visit (INDEPENDENT_AMBULATORY_CARE_PROVIDER_SITE_OTHER): Payer: BLUE CROSS/BLUE SHIELD | Admitting: Psychiatry

## 2017-03-23 ENCOUNTER — Encounter (HOSPITAL_COMMUNITY): Payer: Self-pay | Admitting: Psychiatry

## 2017-03-23 DIAGNOSIS — F331 Major depressive disorder, recurrent, moderate: Secondary | ICD-10-CM | POA: Diagnosis not present

## 2017-03-23 NOTE — Progress Notes (Signed)
THERAPIST PROGRESS NOTE   Session Time:  Wednesday 03/23/2017 8:15 AM - 9:05 AM                                              Participation Level: Active  Behavioral Response: Well GroomedAlert/Pleasant  Type of Therapy: Individual Therapy  Treatment Goals addressed:   1. Learn and implement conflict resolution skills to resolve interpersonal problems       2. Identify and replace thoughts and beliefs that support depression and anxiety  Interventions: CBT   Summary: Erica Lynch is a 56 y.o. female who is referred for services by PCP Dr. Willey Blade due to patient experiencing symptoms of depression and anxiety. She is a returning patient to this clinician and last was seen in 2014. She initially was seen in this practice in 2012.  She states her emotions are out of control and  reports feeling angry, anxious, unhappy, and crying a lot. She says she feels as if she will never be happy again. She also  states she doesn't want to be around people. Her main stressor is her marriage. She and her husband began to experience increased problems in their marriage when son left for college 2 years ago. They have little in common now and often disagree about money and caring for thier possessions. Patient also reports discord related to her lack of interest in sex since she has been menopausal. She states being tired of picking up after her husband and adult son. She reports additional stress related to not settling deceased  father's estate , conflict with her middle sister, and issues at work.  Patient last was seen about 3 weeks ago. She denies having any periods of depression and reports decreased anger. She has been using thought log and has increased emotional awareness as well as thought awareness.  She reports stress and continued frustration regarding work due to recent incidents. She has maintained consistent positive self-care. She goes to gym twice a week. She continues to  engage in a  variety of activities and reports enjoying a recent girls beach trip.    Suicidal/Homicidal: None   Therapist Response: Reviewed symptoms, facilitated expression of feelings, praised and reinforced patient's continued positive self-care, praise and reinforced patient's completion of homework, review connection between thoughts/mood/behavior using examples from patient's thought log, assisted patient  Identify/challenge, and replace cognitive distortions with healthy alternatives, assisted patient identify ways to improve assertiveness skills regarding interaction at work, assigned patient to continue using simple thought log and bring to next session  Plan: Return again in 2 weeks.  Diagnosis: Axis I: Major depressive disorder, recurrent, moderate    Axis II: Deferred    Susie Pousson, LCSW 03/23/2017

## 2017-04-07 DIAGNOSIS — M79672 Pain in left foot: Secondary | ICD-10-CM | POA: Diagnosis not present

## 2017-04-07 DIAGNOSIS — M722 Plantar fascial fibromatosis: Secondary | ICD-10-CM | POA: Diagnosis not present

## 2017-04-11 DIAGNOSIS — M722 Plantar fascial fibromatosis: Secondary | ICD-10-CM | POA: Diagnosis not present

## 2017-04-14 DIAGNOSIS — M722 Plantar fascial fibromatosis: Secondary | ICD-10-CM | POA: Diagnosis not present

## 2017-04-19 DIAGNOSIS — M722 Plantar fascial fibromatosis: Secondary | ICD-10-CM | POA: Diagnosis not present

## 2017-04-21 ENCOUNTER — Ambulatory Visit (HOSPITAL_COMMUNITY): Payer: Self-pay | Admitting: Psychiatry

## 2017-04-21 DIAGNOSIS — M722 Plantar fascial fibromatosis: Secondary | ICD-10-CM | POA: Diagnosis not present

## 2017-04-21 DIAGNOSIS — K648 Other hemorrhoids: Secondary | ICD-10-CM | POA: Diagnosis not present

## 2017-04-26 DIAGNOSIS — M722 Plantar fascial fibromatosis: Secondary | ICD-10-CM | POA: Diagnosis not present

## 2017-05-03 DIAGNOSIS — M722 Plantar fascial fibromatosis: Secondary | ICD-10-CM | POA: Diagnosis not present

## 2017-05-05 DIAGNOSIS — M722 Plantar fascial fibromatosis: Secondary | ICD-10-CM | POA: Diagnosis not present

## 2017-05-09 ENCOUNTER — Encounter (HOSPITAL_COMMUNITY): Payer: Self-pay | Admitting: Psychiatry

## 2017-05-09 ENCOUNTER — Ambulatory Visit (INDEPENDENT_AMBULATORY_CARE_PROVIDER_SITE_OTHER): Payer: BLUE CROSS/BLUE SHIELD | Admitting: Psychiatry

## 2017-05-09 DIAGNOSIS — F331 Major depressive disorder, recurrent, moderate: Secondary | ICD-10-CM

## 2017-05-09 NOTE — Progress Notes (Signed)
THERAPIST PROGRESS NOTE   Session Time:  Monday 05/09/2017 2:02 PM -  2:58 PM                                          Participation Level: Active  Behavioral Response: Well GroomedAlert/tearful,  Type of Therapy: Individual Therapy  Treatment Goals addressed:   1. Learn and implement conflict resolution skills to resolve interpersonal problems       2. Identify and replace thoughts and beliefs that support depression and anxiety  Interventions: CBT   Summary: Erica Lynch is a 56 y.o. female who is referred for services by PCP Dr. Willey Blade due to patient experiencing symptoms of depression and anxiety. She is a returning patient to this clinician and last was seen in 2014. She initially was seen in this practice in 2012.  She states her emotions are out of control and  reports feeling angry, anxious, unhappy, and crying a lot. She says she feels as if she will never be happy again. She also  states she doesn't want to be around people. Her main stressor is her marriage. She and her husband began to experience increased problems in their marriage when son left for college 2 years ago. They have little in common now and often disagree about money and caring for thier possessions. Patient also reports discord related to her lack of interest in sex since she has been menopausal. She states being tired of picking up after her husband and adult son. She reports additional stress related to not settling deceased  father's estate , conflict with her middle sister, and issues at work.  Patient last was seen about 6 weeks ago. She reports increased stress and symptoms of depression since last session. 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 She admits placing a lot of pressure on self. She has decreased efforts regarding self-care and is experiencing increased sleep difficulty along with excessive worry. She also is experiencing more irritability and anger. Patient  has been using thought log but reports having more difficulty replacing unhealthy thought patterns and recent weeks. She also reports decreased social involvement.   Suicidal/Homicidal: None   Therapist Response: Reviewed symptoms, discussed stressors, reviewed and processed material from patient's thought log, examined patient's intermediate beliefs and core beliefs, assisted patient identify/challenge/and replace negative thoughts with healthy alternatives, reviewed the role of self-care and assisted patient identify ways to improve self-care, assisted patient identify and address thoughts/processes that would inhibit efforts to improve self-care, assigned patient to continue using thought log  Plan: Return again in 2 weeks.  Diagnosis: Axis I: Major depressive disorder, recurrent, moderate    Axis II: Deferred    Gennie Eisinger, LCSW 05/09/2017

## 2017-05-17 ENCOUNTER — Other Ambulatory Visit: Payer: Self-pay | Admitting: Obstetrics and Gynecology

## 2017-05-17 DIAGNOSIS — Z1231 Encounter for screening mammogram for malignant neoplasm of breast: Secondary | ICD-10-CM

## 2017-05-17 DIAGNOSIS — M722 Plantar fascial fibromatosis: Secondary | ICD-10-CM | POA: Diagnosis not present

## 2017-05-24 DIAGNOSIS — M722 Plantar fascial fibromatosis: Secondary | ICD-10-CM | POA: Diagnosis not present

## 2017-05-26 DIAGNOSIS — M722 Plantar fascial fibromatosis: Secondary | ICD-10-CM | POA: Diagnosis not present

## 2017-06-01 ENCOUNTER — Ambulatory Visit (INDEPENDENT_AMBULATORY_CARE_PROVIDER_SITE_OTHER): Payer: BLUE CROSS/BLUE SHIELD | Admitting: Psychiatry

## 2017-06-01 ENCOUNTER — Encounter (HOSPITAL_COMMUNITY): Payer: Self-pay | Admitting: Psychiatry

## 2017-06-01 DIAGNOSIS — F331 Major depressive disorder, recurrent, moderate: Secondary | ICD-10-CM

## 2017-06-01 NOTE — Progress Notes (Signed)
THERAPIST PROGRESS NOTE       Session Time:  Wednesday 06/01/2017 8:08 AM - 9:03 AM                                       Participation Level: Active  Behavioral Response: Well GroomedAlert/tearful,  Type of Therapy: Individual Therapy  Treatment Goals addressed:   1. Learn and implement conflict resolution skills to resolve interpersonal problems       2. Identify and replace thoughts and beliefs that support depression and anxiety  Interventions: CBT   Summary: Erica Lynch is a 56 y.o. female who is referred for services by PCP Dr. Willey Blade due to patient experiencing symptoms of depression and anxiety. She is a returning patient to this clinician and last was seen in 2014. She initially was seen in this practice in 2012.  She states her emotions are out of control and  reports feeling angry, anxious, unhappy, and crying a lot. She says she feels as if she will never be happy again. She also  states she doesn't want to be around people. Her main stressor is her marriage. She and her husband began to experience increased problems in their marriage when son left for college 2 years ago. They have little in common now and often disagree about money and caring for thier possessions. Patient also reports discord related to her lack of interest in sex since she has been menopausal. She states being tired of picking up after her husband and adult son. She reports additional stress related to not settling deceased  father's estate , conflict with her middle sister, and issues at work.  Patient last was seen about 3 weeks ago. She reports decreased stress and symptoms of depression since last session. She reports beginning to set/maintain boundaries at work. She has increased efforts to take better care of self at work including taking a lunch. She also has improved overall self-care including improved eating patterns and increased exercise , She has been practicing deep breathing for  relaxation technique. She has been using her spirituality by doing morning devotionals. She also reports improvement in relationship with husband. She reports decreased irritability and anger.    Suicidal/Homicidal: None   Therapist Response: Reviewed symptoms, praise and reinforced patient's improved self-care efforts and improved use of assertiveness skills, discuss discussed effects on mood, review connection between thoughts/mood/behavior, reviewed rationale for and practiced completing daily thought record (DTR), assigned patient to complete once daily and bring to next session, encouraged patient to continue positive self-care efforts  Plan: Return again in 2 weeks.  Diagnosis: Axis I: Major depressive disorder, recurrent, moderate    Axis II: Deferred    Burnetta Kohls, LCSW 06/01/2017

## 2017-06-07 DIAGNOSIS — M722 Plantar fascial fibromatosis: Secondary | ICD-10-CM | POA: Diagnosis not present

## 2017-06-07 DIAGNOSIS — Q664 Congenital talipes calcaneovalgus: Secondary | ICD-10-CM | POA: Diagnosis not present

## 2017-06-07 DIAGNOSIS — M79672 Pain in left foot: Secondary | ICD-10-CM | POA: Diagnosis not present

## 2017-06-13 ENCOUNTER — Ambulatory Visit (HOSPITAL_COMMUNITY): Payer: Self-pay

## 2017-06-13 ENCOUNTER — Encounter (HOSPITAL_COMMUNITY): Payer: Self-pay | Admitting: Psychiatry

## 2017-06-13 ENCOUNTER — Ambulatory Visit (INDEPENDENT_AMBULATORY_CARE_PROVIDER_SITE_OTHER): Payer: BLUE CROSS/BLUE SHIELD | Admitting: Psychiatry

## 2017-06-13 ENCOUNTER — Ambulatory Visit (HOSPITAL_COMMUNITY)
Admission: RE | Admit: 2017-06-13 | Discharge: 2017-06-13 | Disposition: A | Discharge status: Home / Self Care | Payer: BLUE CROSS/BLUE SHIELD | Source: Ambulatory Visit | Attending: Obstetrics and Gynecology | Admitting: Obstetrics and Gynecology

## 2017-06-13 DIAGNOSIS — F331 Major depressive disorder, recurrent, moderate: Secondary | ICD-10-CM

## 2017-06-13 DIAGNOSIS — Z1231 Encounter for screening mammogram for malignant neoplasm of breast: Secondary | ICD-10-CM | POA: Diagnosis not present
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
## 2017-06-16 DIAGNOSIS — M722 Plantar fascial fibromatosis: Secondary | ICD-10-CM | POA: Diagnosis not present

## 2017-07-07 ENCOUNTER — Encounter (HOSPITAL_COMMUNITY): Payer: Self-pay | Admitting: Psychiatry

## 2017-07-07 ENCOUNTER — Ambulatory Visit (INDEPENDENT_AMBULATORY_CARE_PROVIDER_SITE_OTHER): Payer: BLUE CROSS/BLUE SHIELD | Admitting: Psychiatry

## 2017-07-07 DIAGNOSIS — F331 Major depressive disorder, recurrent, moderate: Secondary | ICD-10-CM | POA: Diagnosis not present

## 2017-07-07 NOTE — Progress Notes (Signed)
THERAPIST PROGRESS NOTE       Session Time:  Thursday 07/07/2017  8:12 AM - 9:10 AM                                         Participation Level: Active         Behavioral Response: Well GroomedAlert/  Type of Therapy: Individual Therapy  Treatment Goals addressed:   1. Learn and implement conflict resolution skills to resolve interpersonal problems       2. Identify and replace thoughts and beliefs that support depression and anxiety  Interventions: CBT   Summary: Erica Lynch is a 56 y.o. female who is referred for services by PCP Dr. Willey Blade due to patient experiencing symptoms of depression and anxiety. She is a returning patient to this clinician and last was seen in 2014. She initially was seen in this practice in 2012.  She states her emotions are out of control and  reports feeling angry, anxious, unhappy, and crying a lot. She says she feels as if she will never be happy again. She also  states she doesn't want to be around people. Her main stressor is her marriage. She and her husband began to experience increased problems in their marriage when son left for college 2 years ago. They have little in common now and often disagree about money and caring for thier possessions. Patient also reports discord related to her lack of interest in sex since she has been menopausal. She states being tired of picking up after her husband and adult son. She reports additional stress related to not settling deceased  father's estate , conflict with her middle sister, and issues at work.  Patient last was seen about 3 weeks ago. She reports decreased stress and improved mood since last session. She expresses relief that follow-up mammogram was favorable. She reports enjoying vacation with her husband and some friends last week.. She has returned to work and reports beginning to experience frustration and anger again. She has used the DTR to try to work through her thoughts. 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 She expresses frustration regarding lack of clarity regarding expectations and admits making assumptions. She also expresses resentment regarding the change in her relationship with her brother-in-law in the past 2 years since another employee has joined the business. Suicidal/Homicidal: None   Therapist Response: Reviewed symptoms, praised and reinforced patient's efforts to use DTR, reviewed DTR, assisted patient identify the connection between thoughts/mood/and behavior using examples from the DTR, assisted patient identify/challenge/replace cognitive distortions with healthy alternatives, reviewed rationale for consistent daily practice completing DTR, assigned patient to complete once daily and bring to next session, facilitate expression of thoughts and feelings regarding the relationship with her brother and others regarding the business, assisted patient with problem solving and identify ways to improve assertiveness skills regarding expressing her concerns and clarifying expectations with the use of modeling  discussed assertive behavior versus aggressive behavior,  Plan: Return again in 2 weeks.  Diagnosis: Axis I: Major depressive disorder, recurrent, moderate    Axis II: Deferred    Milina Pagett, LCSW 07/07/2017

## 2017-07-11 ENCOUNTER — Encounter (HOSPITAL_COMMUNITY): Payer: Self-pay | Admitting: Psychiatry

## 2017-07-11 ENCOUNTER — Ambulatory Visit (INDEPENDENT_AMBULATORY_CARE_PROVIDER_SITE_OTHER): Payer: BLUE CROSS/BLUE SHIELD | Admitting: Psychiatry

## 2017-07-11 VITALS — BP 99/74 | HR 82 | Ht 64.0 in | Wt 193.0 lb

## 2017-07-11 DIAGNOSIS — F331 Major depressive disorder, recurrent, moderate: Secondary | ICD-10-CM | POA: Diagnosis not present

## 2017-07-11 DIAGNOSIS — Z818 Family history of other mental and behavioral disorders: Secondary | ICD-10-CM

## 2017-07-11 DIAGNOSIS — Z79899 Other long term (current) drug therapy: Secondary | ICD-10-CM | POA: Diagnosis not present

## 2017-07-11 DIAGNOSIS — F419 Anxiety disorder, unspecified: Secondary | ICD-10-CM

## 2017-07-11 DIAGNOSIS — G479 Sleep disorder, unspecified: Secondary | ICD-10-CM

## 2017-07-11 DIAGNOSIS — Z566 Other physical and mental strain related to work: Secondary | ICD-10-CM

## 2017-07-11 MED ORDER — TRAZODONE HCL 50 MG PO TABS: 50.0000 mg | ORAL_TABLET | Freq: Every day | ORAL | 3 refills | Status: DC

## 2017-07-11 MED ORDER — CLONAZEPAM 1 MG PO TABS: 1.0000 mg | ORAL_TABLET | Freq: Two times a day (BID) | ORAL | 5 refills | Status: DC | PRN

## 2017-07-11 MED ORDER — BUPROPION HCL ER (XL) 150 MG PO TB24: 150.0000 mg | ORAL_TABLET | Freq: Every day | ORAL | 5 refills | Status: DC

## 2017-07-11 NOTE — Progress Notes (Signed)
Patient ID: Erica Lynch, female   DOB: 1961-08-06, 56 y.o.   MRN: [id number] Patient ID: Erica Lynch, female   DOB: Aug 26, 1961, 56 y.o.   MRN: [id number] Patient ID: [name], female   DOB: [birth year], 56 y.o.   MRN: [id number] Patient ID: Erica Lynch, female   DOB: 08/30/1961, 56 y.o.   MRN: [id number]  Psychiatric Initial Adult Assessment   Patient Identification: Erica Lynch MRN:  [id number] Date of Evaluation:  07/11/2017 Referral Source: Maurice Small MSW Chief Complaint:   Chief Complaint    Anxiety; Depression; Follow-up     Visit Diagnosis:    ICD-10-CM   1. Major depressive disorder, recurrent episode, moderate (HCC) F33.1    Diagnosis:   Patient Active Problem List   Diagnosis Date Noted  . Vaginal dryness [N89.8] 10/20/2016  . Depression [F32.9] 10/20/2016  . Vitamin D deficiency [E55.9] 12/05/2015  . Hot flashes [R23.2] 12/04/2015  . Fatigue [R53.83] 12/04/2015  . Major depression [F32.9] 06/17/2015  . Breast calcifications on mammogram [R92.1] 11/27/2014  . Back pain with right-sided radiculopathy [M54.10] 10/08/2014  . 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 Other and unspecified ovarian cyst [N83.209] 05/23/2014  . Unspecified symptom associated with female genital organs [N94.9] 05/22/2014  . Hematuria [R31.9] 05/22/2014  . LLQ pain [R10.32] 05/22/2014  . History of breast lump [Z87.898] 02/19/2014  . Left breast mass [N63.20] 11/07/2013  . Screen for colon cancer [Z12.11] 08/04/2011   History of Present Illness:  This patient is a 56 year old married white female who lives with her husband in [place]. 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  The patient was originally referred by her primary care physician, Dr. Willey Blade, to see Maurice Small for counseling. She in turn has referred her to me for medication management of depression and anxiety.  The patient states that her mood problems started during the first year  after she had her son 20 years ago. She felt sad down and was having crying spells. She was also having marital problems. At the time her husband was acting in an immature fashion, going out with friends a lot drinking and leaving her alone with the baby. She never had treatment for this at the time.  Over the years her depression worsened at times. Her mother died in 12-26-2005 from cancer which caused her to feel worse. Her primary physician put her on Zoloft and later Cymbalta and most recently on Prozac. Her father was an alcoholic who died in 2094 from complications of alcoholism. In 12-26-2012 she decided try going off Prozac but got very depressed without it and went back on. She's now on accommodation of Prozac and Wellbutrin.  Despite the 2 antidepressants the patient feels sad all the time. Her energy isn't motivation are low. She states that she is able to function at home and work but just doesn't enjoy anything. She sleeps but only when she takes clonazepam at bedtime. Her marriage is still not going well and she feels that she and her husband have little in common. He still doesn't help much around the house and would rather spend time with his mother than a dinner with her. She feels alone much of the time. She does have one close friend that she does things with. She tries to exercise 2-3 times a week. She has never been suicidal has never had psychotic symptoms or previous psychiatric treatment.  The patient returns after 6 months. For the most part she  is doing okay. However she's had a lot of issues at work. Her brother-in-law has given her a lot of extra duties and she feels overwhelmed. She often wakes up in the middle the night because she is thinking about work issues for the next day. I suggested we add trazodone to help her sleep better. She is working a lot of these things with Maurice Small in therapy. She obviously has to set more limits on how much she can do. Her mood is generally  good.   Elements:  Location:  Global. Quality:  Moderate. Severity:  Moderate. Timing:  Daily. Duration:  20 years. Context:  Marital problems, loss of parents. Associated Signs/Symptoms: Depression Symptoms:  depressed mood, anhedonia, psychomotor retardation, fatigue, difficulty concentrating, anxiety, loss of energy/fatigue, (Hypo) Manic Symptoms:  Irritable Mood, Anxiety Symptoms:  Excessive Worry,   Past Medical History:  Past Medical History:  Diagnosis Date  . Anxiety   . Depression   . Fatigue 12/04/2015  . Hematuria 05/22/2014  . History of breast lump 02/19/2014  . Hot flashes 12/04/2015  . Hyperlipidemia   . IBS (irritable bowel syndrome)   . Left breast mass 11/07/2013  . LLQ pain 05/22/2014  . Lumbar herniated disc November 2016  . Other and unspecified ovarian cyst 05/23/2014  . PONV (postoperative nausea and vomiting)   . Unspecified symptom associated with female genital organs 05/22/2014  . Vitamin D deficiency 12/05/2015    Past Surgical History:  Procedure Laterality Date  . CESAREAN SECTION  1996  . COLONOSCOPY  08/2011   Dr. Gala Romney: normal  . ENDOMETRIAL ABLATION  2-3 yrs ago   [facility]  . OTHER SURGICAL HISTORY     Uterine ablation  . TUBAL LIGATION  6 yrs ago   [facility]   Family History:  Family History  Problem Relation Age of Onset  . Lung cancer Mother   . Breast cancer Mother   . Cancer Mother        breast  . Depression Mother   . Cirrhosis Father        etoh   . Anxiety disorder Father   . Alcohol abuse Father   . Depression Father   . Diabetes Maternal Grandfather   . Heart disease Maternal Grandfather   . Depression Maternal Grandmother   . Depression Maternal Uncle   . Depression Maternal Uncle   . Anesthesia problems Neg Hx   . Hypotension Neg Hx   . Malignant hyperthermia Neg Hx   . Pseudochol deficiency Neg Hx    Social History:   Social History   Social History  . Marital status: Married    Spouse name: N/A   . Number of children: 1  . Years of education: N/A   Occupational History  . [employer]   Social History Main Topics  . Smoking status: Never Smoker  . Smokeless tobacco: Never Used  . Alcohol use Yes     Comment: glass of wine once a month sometime not at all  . Drug use: No  . Sexual activity: Yes    Birth control/ protection: Surgical     Comment: tubal and ablation   Other Topics Concern  . None   Social History Narrative   1 son-16         Additional Social History: The patient grew up in [place] with both parents. She has 2 sisters. She's never been the victim of any trauma or abuse. She has a 2  year college degree. 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 Her brother-in-law now owns the store.  Musculoskeletal: Strength & Muscle Tone: within normal limits Gait & Station: normal Patient leans: N/A  Psychiatric Specialty Exam: Depression         Past medical history includes anxiety.   Anxiety  Symptoms include nervous/anxious behavior.      Review of Systems  Psychiatric/Behavioral: Positive for depression. The patient is nervous/anxious.   All other systems reviewed and are negative.   Blood pressure 99/74, pulse 82, height 5\' 4"  (1.626 m), weight 193 lb (87.5 kg).Body mass index is 33.13 kg/m.  General Appearance: Casual, Neat and Well Groomed  Eye Contact:  Good  Speech:  Clear and Coherent  Volume:  Normal  Mood: good   Affect: Bright   Thought Process:  Goal Directed  Orientation:  Full (Time, Place, and Person)  Thought Content:  Rumination  Suicidal Thoughts:  No  Homicidal Thoughts:  No  Memory:  Immediate;   Good Recent;   Good Remote;   Good  Judgement:  Good  Insight:  Good  Psychomotor Activity:  Normal  Concentration:  Fair  Recall:  Good  Fund of Knowledge:Good  Language: Good  Akathisia:  No  Handed:   Right  AIMS (if indicated):    Assets:  Communication Skills Desire for Improvement Physical Health Resilience Social Support Talents/Skills  ADL's:  Intact  Cognition: WNL  Sleep:  ok   Is the patient at risk to self?  No. Has the patient been a risk to self in the past 6 months?  No. Has the patient been a risk to self within the distant past?  No. Is the patient a risk to others?  No. Has the patient been a risk to others in the past 6 months?  No. Has the patient been a risk to others within the distant past?  No.  Allergies:  No Known Allergies Current Medications: Current Outpatient Prescriptions  Medication Sig Dispense Refill  . buPROPion (WELLBUTRIN XL) 150 MG 24 hr tablet Take 1 tablet (150 mg total) by mouth daily. 30 tablet 5  . Cholecalciferol 5000 units capsule Take 1 capsule (5,000 Units total) by mouth daily.    . clonazePAM (KLONOPIN) 1 MG tablet Take 1 tablet (1 mg total) by mouth 2 (two) times daily as needed for anxiety. 60 tablet 5  . estradiol (ESTRACE VAGINAL) 0.1 MG/GM vaginal cream Use 1/2 to 1 gm daily for 2 weeks then 2-3 x weekly prn 42.5 g 1  . estrogens, conjugated, (PREMARIN) 0.625 MG tablet Take 1 tablet (0.625 mg total) by mouth daily. 30 tablet 12  . progesterone (PROMETRIUM) 200 MG capsule Take 1 capsule (200 mg total) by mouth daily. 30 capsule 12  . traZODone (DESYREL) 50 MG tablet Take 1 tablet (50 mg total) by mouth at bedtime. 30 tablet 3   No current facility-administered medications for this visit.     Previous Psychotropic Medications: Yes   Substance Abuse History in the last 12 months:  No.  Consequences of Substance Abuse: NA  Medical Decision Making:  Review of Psycho-Social Stressors (1), Review or order clinical lab tests (1), Review and summation of old records (2), Established Problem, Worsening (2), Review of Medication Regimen & Side Effects (2) and Review of New Medication or Change in Dosage (2)  Treatment Plan  Summary: Medication  management   The patient will continue Wellbutrin XL 150 mg daily. She'll continue clonazepam as bedtime as needed for anxiety. She'll also start trazodone 50 mg at bedtime for sleep . She'll return in 3 months    [place], [facility] 10/15/20189:33 AM Patient ID: NELL GALES, female   DOB: 06-28-1961, 56 y.o.   MRN: [id number]

## 2017-07-13 ENCOUNTER — Other Ambulatory Visit: Payer: Self-pay | Admitting: Physician Assistant

## 2017-07-13 DIAGNOSIS — C44529 Squamous cell carcinoma of skin of other part of trunk: Secondary | ICD-10-CM | POA: Diagnosis not present

## 2017-07-13 DIAGNOSIS — D492 Neoplasm of unspecified behavior of bone, soft tissue, and skin: Secondary | ICD-10-CM | POA: Diagnosis not present

## 2017-07-21 ENCOUNTER — Ambulatory Visit (HOSPITAL_COMMUNITY): Payer: Self-pay | Admitting: Psychiatry

## 2017-07-28 DIAGNOSIS — F339 Major depressive disorder, recurrent, unspecified: Secondary | ICD-10-CM | POA: Diagnosis not present

## 2017-07-28 DIAGNOSIS — M545 Low back pain: Secondary | ICD-10-CM | POA: Diagnosis not present

## 2017-07-28 DIAGNOSIS — Z23 Encounter for immunization: Secondary | ICD-10-CM | POA: Diagnosis not present

## 2017-08-04 ENCOUNTER — Telehealth (HOSPITAL_COMMUNITY): Payer: Self-pay | Admitting: *Deleted

## 2017-08-04 ENCOUNTER — Encounter (HOSPITAL_COMMUNITY): Payer: Self-pay | Admitting: Psychiatry

## 2017-08-04 ENCOUNTER — Ambulatory Visit (HOSPITAL_COMMUNITY): Payer: BLUE CROSS/BLUE SHIELD | Admitting: Psychiatry

## 2017-08-04 DIAGNOSIS — F331 Major depressive disorder, recurrent, moderate: Secondary | ICD-10-CM

## 2017-08-04 NOTE — Progress Notes (Signed)
THERAPIST PROGRESS NOTE       Session Time:  Thursday 08/04/2017 8:10 AM -  9:09 AM                                    Participation Level: Active         Behavioral Response: Well GroomedAlert/  Type of Therapy: Individual Therapy  Treatment Goals addressed:   1. Learn and implement conflict resolution skills to resolve interpersonal problems       2. Identify and replace thoughts and beliefs that support depression and anxiety             Interventions: CBT   Summary: Erica Lynch is a 56 y.o. female who is referred for services by PCP Dr. Willey Blade due to patient experiencing symptoms of depression and anxiety. She is a returning patient to this clinician and last was seen in 2014. She initially was seen in this practice in 2012.  She states her emotions are out of control and  reports feeling angry, anxious, unhappy, and crying a lot. She says she feels as if she will never be happy again. She also  states she doesn't want to be around people. Her main stressor is her marriage. She and her husband began to experience increased problems in their marriage when son left for college 2 years ago. They have little in common now and often disagree about money and caring for thier possessions. Patient also reports discord related to her lack of interest in sex since she has been menopausal. She states being tired of picking up after her husband and adult son. She reports additional stress related to not settling deceased  father's estate , conflict with her middle sister, and issues at work.  Patient last was seen about 3 -4 weeks ago. She reports increased stress related to recent conflict and issues at work. She reports using assertiveness skills in expressing her concerns and trying to clarify expectations with her brother in law. She is pleased with her efforts but expresses frustration, hurt, and disappointment regarding brother-in-law's response in another situation involving patient  and another coworker at work. She reports using journaling to process her feelings and to identify/challenge/and replace negative thinking about self. She also was able to identify realistic expectations of the relationship with her brother-in-law and of self. She states feeling better and feeling as though a load has been lifted. She has improved her ability to set and maintain boundaries regarding her job. She reports increased support from her husband and her son and reports this has been helpful. She has maintained involvement in activities and positive self-care.   Suicidal/Homicidal: None   Therapist Response: Reviewed symptoms, discussed stressors, facilitated expression of thoughts and feelings, validated feelings, praised and reinforced her use of assertiveness skills, praised and reinforced patient's use of journaling,  processed journal material, assisted patient identify/challenge/and replace cognitive distortions with healthy alternatives, began to explore patient's core beliefs, assigned patient to continue using journaling/DTR, encouraged patient to maintain positive self-care   Plan: Return again in 2 weeks.  Diagnosis: Axis I: Major depressive disorder, recurrent, moderate    Axis II: Deferred    Delontae Lamm, LCSW 08/04/2017

## 2017-08-04 NOTE — Telephone Encounter (Signed)
Call in restoril 15 mg, one as needed at bedtime #30, 2 refills

## 2017-08-04 NOTE — Telephone Encounter (Signed)
Pt came into office to see another provider today. Per pt, her Trazodone is making her stomach hurt. Per pt she can not take this medication anymore. Per pt is there anything else she can take. Pt number is [phone].

## 2017-08-23 ENCOUNTER — Encounter (HOSPITAL_COMMUNITY): Payer: Self-pay

## 2017-08-23 ENCOUNTER — Emergency Department (HOSPITAL_COMMUNITY): Payer: BLUE CROSS/BLUE SHIELD

## 2017-08-23 ENCOUNTER — Emergency Department (HOSPITAL_COMMUNITY)
Admission: EM | Admit: 2017-08-23 | Discharge: 2017-08-23 | Disposition: A | Discharge status: Home / Self Care | Payer: BLUE CROSS/BLUE SHIELD | Attending: Emergency Medicine | Admitting: Emergency Medicine

## 2017-08-23 DIAGNOSIS — Z79899 Other long term (current) drug therapy: Secondary | ICD-10-CM | POA: Insufficient documentation

## 2017-08-23 DIAGNOSIS — R0789 Other chest pain: Secondary | ICD-10-CM

## 2017-08-23 DIAGNOSIS — E785 Hyperlipidemia, unspecified: Secondary | ICD-10-CM | POA: Insufficient documentation

## 2017-08-23 DIAGNOSIS — R079 Chest pain, unspecified: Secondary | ICD-10-CM | POA: Diagnosis not present

## 2017-08-23 LAB — D-DIMER, QUANTITATIVE: D-Dimer, Quant: <0.27 ug/mL-FEU (ref 0.00–0.50)

## 2017-08-23 LAB — HEPATIC FUNCTION PANEL
ALK PHOS: 53 U/L (ref 38–126)
ALT: 16 U/L (ref 14–54)
AST: 20 U/L (ref 15–41)
Albumin: 4.1 g/dL (ref 3.5–5.0)
BILIRUBIN TOTAL: 0.5 mg/dL (ref 0.3–1.2)
Total Protein: 7.3 g/dL (ref 6.5–8.1)

## 2017-08-23 LAB — LIPASE, BLOOD: Lipase: 28 U/L (ref 11–51)

## 2017-08-23 LAB — CBC WITH DIFFERENTIAL/PLATELET
Basophils Absolute: 0 10*3/uL (ref 0.0–0.1)
Basophils Relative: 0 %
EOS ABS: 0.1 10*3/uL (ref 0.0–0.7)
Eosinophils Relative: 2 %
HCT: 45.1 % (ref 36.0–46.0)
HEMOGLOBIN: 14.5 g/dL (ref 12.0–15.0)
LYMPHS ABS: 2.7 10*3/uL (ref 0.7–4.0)
Lymphocytes Relative: 32 %
MCH: 30.9 pg (ref 26.0–34.0)
MCHC: 32.2 g/dL (ref 30.0–36.0)
MCV: 96.2 fL (ref 78.0–100.0)
MONOS PCT: 7 %
Monocytes Absolute: 0.6 10*3/uL (ref 0.1–1.0)
NEUTROS PCT: 59 %
Neutro Abs: 5 10*3/uL (ref 1.7–7.7)
Platelets: 318 10*3/uL (ref 150–400)
RBC: 4.69 MIL/uL (ref 3.87–5.11)
RDW: 12.2 % (ref 11.5–15.5)
WBC: 8.3 10*3/uL (ref 4.0–10.5)

## 2017-08-23 LAB — BASIC METABOLIC PANEL
Anion gap: 6 (ref 5–15)
BUN: 16 mg/dL (ref 6–20)
CHLORIDE: 105 mmol/L (ref 101–111)
CO2: 27 mmol/L (ref 22–32)
CREATININE: 1.04 mg/dL — AB (ref 0.44–1.00)
Calcium: 9.2 mg/dL (ref 8.9–10.3)
GFR calc Af Amer: >60 mL/min (ref >60)
GFR calc non Af Amer: 58 mL/min — ABNORMAL LOW (ref >60)
GLUCOSE: 102 mg/dL — AB (ref 65–99)
Potassium: 3.9 mmol/L (ref 3.5–5.1)
SODIUM: 138 mmol/L (ref 135–145)

## 2017-08-23 LAB — TROPONIN I

## 2017-08-23 MED ORDER — PANTOPRAZOLE SODIUM 20 MG PO TBEC: 20.0000 mg | DELAYED_RELEASE_TABLET | Freq: Every day | ORAL | 0 refills | Status: DC

## 2017-08-23 NOTE — ED Notes (Signed)
Pt back from xray, PA student at the bedside, husband with patient

## 2017-08-23 NOTE — ED Triage Notes (Signed)
Pt reports woke up during the night with pain in center of chest radiating through to her back and feeling like she needs to take a deep breath.  Reports area is tender to touch and hurts to move.

## 2017-08-23 NOTE — ED Notes (Signed)
Going for xray

## 2017-08-23 NOTE — ED Provider Notes (Signed)
[facility] EMERGENCY DEPARTMENT Provider Note   CSN: [id number] Arrival date & time: 08/23/17  5093     History   Chief Complaint Chief Complaint  Patient presents with  . Chest Pain    HPI Erica Lynch is a 56 y.o. female.  Patient states that she has been having some chest pain that goes through to her back worse with inspiration.  It woke her up today.  The pain has improved though it is not hurting now   The history is provided by the patient. No language interpreter was used.  Chest Pain   This is a new problem. The current episode started 6 to 12 hours ago. The problem occurs hourly. The problem has been gradually worsening. The pain is present in the substernal region. The pain is at a severity of 4/10. The pain is moderate. The quality of the pain is described as burning. Radiates to: Radiates to back. Pertinent negatives include no abdominal pain, no back pain, no cough and no headaches.  Pertinent negatives for past medical history include no seizures.    Past Medical History:  Diagnosis Date  . Anxiety   . Depression   . Fatigue 12/04/2015  . Hematuria 05/22/2014  . History of breast lump 02/19/2014  . Hot flashes 12/04/2015  . Hyperlipidemia   . IBS (irritable bowel syndrome)   . Left breast mass 11/07/2013  . LLQ pain 05/22/2014  . Lumbar herniated disc November 2016  . Other and unspecified ovarian cyst 05/23/2014  . PONV (postoperative nausea and vomiting)   . Unspecified symptom associated with female genital organs 05/22/2014  . Vitamin D deficiency 12/05/2015    Patient Active Problem List   Diagnosis Date Noted  . Vaginal dryness 10/20/2016  . Depression 10/20/2016  . Vitamin D deficiency 12/05/2015  . Hot flashes 12/04/2015  . Fatigue 12/04/2015  . Major depression 06/17/2015  . Breast calcifications on mammogram 11/27/2014  . Back pain with right-sided radiculopathy 10/08/2014  . 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 Other and unspecified ovarian  cyst 05/23/2014  . Unspecified symptom associated with female genital organs 05/22/2014  . Hematuria 05/22/2014  . LLQ pain 05/22/2014  . History of breast lump 02/19/2014  . Left breast mass 11/07/2013  . Screen for colon cancer 08/04/2011    Past Surgical History:  Procedure Laterality Date  . CESAREAN SECTION  1996  . COLONOSCOPY  08/2011   Dr. Gala Romney: normal  . ENDOMETRIAL ABLATION  2-3 yrs ago   [facility]  . OTHER SURGICAL HISTORY     Uterine ablation  . TUBAL LIGATION  6 yrs ago   [facility]    OB History    Gravida Para Term Preterm AB Living   1 1 1     1    SAB TAB Ectopic Multiple Live Births           1       Home Medications    Prior to Admission medications   Medication Sig Start Date End Date Taking? Authorizing Provider  buPROPion (WELLBUTRIN XL) 150 MG 24 hr tablet Take 1 tablet (150 mg total) by mouth daily. 07/11/17  Yes Cloria Spring, MD  Cholecalciferol 5000 units capsule Take 1 capsule (5,000 Units total) by mouth daily. 12/05/15  Yes Estill Dooms, NP  clonazePAM (KLONOPIN) 1 MG tablet Take 1 tablet (1 mg total) by mouth 2 (two) times daily as needed for anxiety. 07/11/17  Yes Cloria Spring, MD  estradiol (ESTRACE  VAGINAL) 0.1 MG/GM vaginal cream Use 1/2 to 1 gm daily for 2 weeks then 2-3 x weekly prn 12/21/16  Yes Derrek Monaco A, NP  estrogens, conjugated, (PREMARIN) 0.625 MG tablet Take 1 tablet (0.625 mg total) by mouth daily. 12/21/16  Yes Derrek Monaco A, NP  progesterone (PROMETRIUM) 200 MG capsule Take 1 capsule (200 mg total) by mouth daily. 12/21/16  Yes Derrek Monaco A, NP  pantoprazole (PROTONIX) 20 MG tablet Take 1 tablet (20 mg total) by mouth daily. 08/23/17   Milton Ferguson, MD  traZODone (DESYREL) 50 MG tablet Take 1 tablet (50 mg total) by mouth at bedtime. Patient not taking: Reported on 08/23/2017 07/11/17   Cloria Spring, MD    Family History Family History  Problem Relation Age of Onset  . Lung cancer  Mother   . Breast cancer Mother   . Cancer Mother        breast  . Depression Mother   . Cirrhosis Father        etoh   . Anxiety disorder Father   . Alcohol abuse Father   . Depression Father   . Diabetes Maternal Grandfather   . Heart disease Maternal Grandfather   . Depression Maternal Grandmother   . Depression Maternal Uncle   . Depression Maternal Uncle   . Anesthesia problems Neg Hx   . Hypotension Neg Hx   . Malignant hyperthermia Neg Hx   . Pseudochol deficiency Neg Hx     Social History Social History   Tobacco Use  . Smoking status: Never Smoker  . Smokeless tobacco: Never Used  Substance Use Topics  . Alcohol use: Yes    Comment: glass of wine once a month sometime not at all  . Drug use: No     Allergies   Patient has no known allergies.   Review of Systems Review of Systems  Constitutional: Negative for appetite change and fatigue.  HENT: Negative for congestion, ear discharge and sinus pressure.   Eyes: Negative for discharge.  Respiratory: Negative for cough.   Cardiovascular: Positive for chest pain.  Gastrointestinal: Negative for abdominal pain and diarrhea.  Genitourinary: Negative for frequency and hematuria.  Musculoskeletal: Negative for back pain.  Skin: Negative for rash.  Neurological: Negative for seizures and headaches.  Psychiatric/Behavioral: Negative for hallucinations.     Physical Exam Updated Vital Signs BP 106/81   Pulse 75   Temp 98.2 F (36.8 C) (Oral)   Resp 15   Ht 5\' 4"  (1.626 m)   Wt 83.9 kg (185 lb)   SpO2 94%   BMI 31.76 kg/m   Physical Exam  Constitutional: She is oriented to person, place, and time. She appears well-developed.  HENT:  Head: Normocephalic.  Eyes: Conjunctivae and EOM are normal. No scleral icterus.  Neck: Neck supple. No thyromegaly present.  Cardiovascular: Normal rate and regular rhythm. Exam reveals no gallop and no friction rub.  No murmur heard. Pulmonary/Chest: No stridor. She  has no wheezes. She has no rales. She exhibits no tenderness.  Abdominal: She exhibits no distension. There is no tenderness. There is no rebound.  Musculoskeletal: Normal range of motion. She exhibits no edema.  Lymphadenopathy:    She has no cervical adenopathy.  Neurological: She is oriented to person, place, and time. She exhibits normal muscle tone. Coordination normal.  Skin: No rash noted. No erythema.  Psychiatric: She has a normal mood and affect. Her behavior is normal.     ED Treatments / Results  Labs (all labs ordered are listed, but only abnormal results are displayed) Labs Reviewed  BASIC METABOLIC PANEL - Abnormal; Notable for the following components:      Result Value   Glucose, Bld 102 (*)    Creatinine, Ser 1.04 (*)    GFR calc non Af Amer 58 (*)    All other components within normal limits  HEPATIC FUNCTION PANEL - Abnormal; Notable for the following components:   Bilirubin, Direct <0.1 (*)    All other components within normal limits  CBC WITH DIFFERENTIAL/PLATELET  TROPONIN I  D-DIMER, QUANTITATIVE (NOT AT [facility])  LIPASE, BLOOD    EKG  EKG Interpretation  Date/Time:  Tuesday August 23 2017 08:16:17 EST Ventricular Rate:  86 PR Interval:    QRS Duration: 90 QT Interval:  364 QTC Calculation: 436 R Axis:   58 Text Interpretation:  Sinus rhythm Abnormal R-wave progression, early transition Confirmed by Milton Ferguson [phone]) on 08/23/2017 8:18:23 AM       Radiology Dg Chest 2 View  Result Date: 08/23/2017 CLINICAL DATA:  Chest pain EXAM: CHEST  2 VIEW COMPARISON:  03/10/2011 FINDINGS: COPD with hyperinflation of the lungs. Negative for pneumonia. Heart size and vascularity normal. Lungs are clear. IMPRESSION: COPD.  No acute cardiopulmonary abnormality. Electronically Signed   By: Franchot Gallo M.D.   On: 08/23/2017 08:44    Procedures Procedures (including critical care time)  Medications Ordered in ED Medications - No data to  display   Initial Impression / Assessment and Plan / ED Course  I have reviewed the triage vital signs and the nursing notes.  Pertinent labs & imaging results that were available during my care of the patient were reviewed by me and considered in my medical decision making (see chart for details).     Patient with epigastric and chest discomfort going into her back.  Labs EKG chest x-ray all unremarkable.  Doubt coronary artery disease.  D-dimer negative.  Doubt PE.  Patient may have some pleuritis or esophagitis and possibly gallbladder problems.  Although the liver studies were completely normal.  Patient will be placed on Protonix take Tylenol for pain and follow-up with your family doctor later this week for recheck  Final Clinical Impressions(s) / ED Diagnoses   Final diagnoses:  Atypical chest pain    ED Discharge Orders        Ordered    pantoprazole (PROTONIX) 20 MG tablet  Daily     08/23/17 0958       Milton Ferguson, MD 08/23/17 1002

## 2017-08-23 NOTE — Discharge Instructions (Signed)
Take Tylenol for pain and follow-up with your family doctor later this week for recheck

## 2017-08-23 NOTE — ED Notes (Signed)
Patient given discharge instruction, verbalized understand. IV removed, band aid applied. Patient ambulatory out of the department.  

## 2017-08-25 ENCOUNTER — Ambulatory Visit (HOSPITAL_COMMUNITY): Payer: Self-pay | Admitting: Psychiatry

## 2017-08-26 DIAGNOSIS — K209 Esophagitis, unspecified: Secondary | ICD-10-CM | POA: Diagnosis not present

## 2017-09-07 ENCOUNTER — Ambulatory Visit (HOSPITAL_COMMUNITY): Payer: Self-pay | Admitting: Psychiatry

## 2017-09-07 ENCOUNTER — Ambulatory Visit (HOSPITAL_COMMUNITY): Payer: BLUE CROSS/BLUE SHIELD | Admitting: Psychiatry

## 2017-09-07 DIAGNOSIS — F331 Major depressive disorder, recurrent, moderate: Secondary | ICD-10-CM | POA: Diagnosis not present

## 2017-09-07 NOTE — Progress Notes (Signed)
THERAPIST PROGRESS NOTE       Session Time:  Wednesday 09/07/2017 3:05 PM - 4:00 PM                                    Participation Level: Active         Behavioral Response: Well GroomedAlert/  Type of Therapy: Individual Therapy  Treatment Goals addressed:   1. Learn and implement conflict resolution skills to resolve interpersonal problems       2. Identify and replace thoughts and beliefs that support depression and anxiety             Interventions: CBT   Summary: Erica Lynch is a 56 y.o. female who is referred for services by PCP Dr. Willey Blade due to patient experiencing symptoms of depression and anxiety. She is a returning patient to this clinician and last was seen in 2014. She initially was seen in this practice in 2012.  She states her emotions are out of control and  reports feeling angry, anxious, unhappy, and crying a lot. She says she feels as if she will never be happy again. She also  states she doesn't want to be around people. Her main stressor is her marriage. She and her husband began to experience increased problems in their marriage when son left for college 2 years ago. They have little in common now and often disagree about money and caring for thier possessions. Patient also reports discord related to her lack of interest in sex since she has been menopausal. She states being tired of picking up after her husband and adult son. She reports additional stress related to not settling deceased  father's estate , conflict with her middle sister, and issues at work.  Patient last was seen about 3 -4 weeks ago. She reports decreased work stress. She says she had a conversation with her brother-in-law and was able to express her opinions and concerns in an assertive manner. She reports positive and productive discussion with brother-in-law. They have resolved their issues and patient reports feeling much better about work and their relationship. 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 Patient is very pleased with her progress. She has continued to keep DTR/journal and reports this was helpful in processing her feelings as well as in expressing herself to her brother-in- law.  related to recent conflict and issues at work. She has maintained involvement in activities and positive self-care.   Suicidal/Homicidal: None   Therapist Response: Reviewed symptoms, discussed stressors, facilitated expression of thoughts and feelings, validated feelings, praised and reinforced her use of assertiveness skills, praised and reinforced patient's use of journaling,  processed journal material, assisted patient identify/challenge/and replace cognitive distortions with healthy alternatives, discussed core beliefs and assisted patient began to identify her core beliefs, discussed core belief magnet metaphor to assist patient in understanding how core beliefs are maintained/reinforced, began to identify ways to challenge core beliefs, assigned patient to complete core beliefs handout and bring to next session, assigned patient to continue using journaling/DTR,   Plan: Return again in 2 weeks.  Diagnosis: Axis I: Major depressive disorder, recurrent, moderate    Axis II: Deferred    Merida Alcantar, LCSW 09/07/2017

## 2017-09-08 ENCOUNTER — Ambulatory Visit (HOSPITAL_COMMUNITY): Payer: Self-pay | Admitting: Psychiatry

## 2017-09-23 ENCOUNTER — Encounter (HOSPITAL_COMMUNITY): Payer: Self-pay | Admitting: Psychiatry

## 2017-09-23 ENCOUNTER — Ambulatory Visit (HOSPITAL_COMMUNITY): Payer: BLUE CROSS/BLUE SHIELD | Admitting: Psychiatry

## 2017-09-23 DIAGNOSIS — F331 Major depressive disorder, recurrent, moderate: Secondary | ICD-10-CM | POA: Diagnosis not present

## 2017-09-23 NOTE — Progress Notes (Signed)
THERAPIST PROGRESS NOTE             Session Time:  Friday 09/23/2017 8:19 AM -  9:08 AM                           Participation Level: Active         Behavioral Response: Well GroomedAlert/  Type of Therapy: Individual Therapy  Treatment Goals addressed:   1. Learn and implement conflict resolution skills to resolve interpersonal problems       2. Identify and replace thoughts and beliefs that support depression and anxiety             Interventions: CBT   Summary: Erica Lynch is a 56 y.o. female who is referred for services by PCP Dr. Willey Blade due to patient experiencing symptoms of depression and anxiety. She is a returning patient to this clinician and last was seen in 2014. She initially was seen in this practice in 2012.  She states her emotions are out of control and  reports feeling angry, anxious, unhappy, and crying a lot. She says she feels as if she will never be happy again. She also  states she doesn't want to be around people. Her main stressor is her marriage. She and her husband began to experience increased problems in their marriage when son left for college 2 years ago. They have little in common now and often disagree about money and caring for thier possessions. Patient also reports discord related to her lack of interest in sex since she has been menopausal. She states being tired of picking up after her husband and adult son. She reports additional stress related to not settling deceased  father's estate , conflict with her middle sister, and issues at work.  Patient last was seen about 2 weeks ago. She reports continuing to do well since last session. She reports enjoying celebrating Christmas with her family. She completed her homework and was able to challenge one of her core beliefs with contrary information. She continues to work but still feels inadequate at work at times. She has maintained involvement in activities and positive self-care.   Suicidal/Homicidal: None   Therapist Response: Reviewed symptoms, facilitated expression of thoughts and feelings, praised and reinforced completion of homework, processed homework, assisted patient identify experiences that maintained/reinforced her core beliefs, assisted patient challenge those beliefs, and replace with healthy alternatives, discussed ways rules and assumptions are developed to cope with core beliefs, assigned patient handout to complete regarding rules/assumptions and bring to next session  Plan: Return again in 2 weeks.  Diagnosis: Axis I: Major depressive disorder, recurrent, moderate    Axis II: Deferred    Leata Dominy, LCSW 09/23/2017

## 2017-10-07 ENCOUNTER — Ambulatory Visit (HOSPITAL_COMMUNITY): Payer: Self-pay | Admitting: Psychiatry

## 2017-10-10 DIAGNOSIS — K210 Gastro-esophageal reflux disease with esophagitis: Secondary | ICD-10-CM | POA: Diagnosis not present

## 2017-10-11 ENCOUNTER — Ambulatory Visit (HOSPITAL_COMMUNITY): Payer: Self-pay | Admitting: Psychiatry

## 2017-10-13 ENCOUNTER — Encounter: Payer: Self-pay | Admitting: Gastroenterology

## 2017-10-17 ENCOUNTER — Ambulatory Visit (HOSPITAL_COMMUNITY): Payer: BLUE CROSS/BLUE SHIELD | Admitting: Psychiatry

## 2017-10-17 ENCOUNTER — Encounter (HOSPITAL_COMMUNITY): Payer: Self-pay | Admitting: Psychiatry

## 2017-10-17 DIAGNOSIS — Z630 Problems in relationship with spouse or partner: Secondary | ICD-10-CM | POA: Diagnosis not present

## 2017-10-17 DIAGNOSIS — F331 Major depressive disorder, recurrent, moderate: Secondary | ICD-10-CM | POA: Diagnosis not present

## 2017-10-17 NOTE — Progress Notes (Signed)
THERAPIST PROGRESS NOTE             Session Time:  Monday 10/17/2017 8:17 AM - 9:05 AM                           Participation Level: Active         Behavioral Response: Well GroomedAlert/  Type of Therapy: Individual Therapy  Treatment Goals addressed:   1. Learn and implement conflict resolution skills to resolve interpersonal problems       2. Identify and replace thoughts and beliefs that support depression and anxiety             Interventions: CBT   Summary: Erica Lynch is a 57 y.o. female who is referred for services by PCP Dr. Willey Blade due to patient experiencing symptoms of depression and anxiety. She is a returning patient to this clinician and last was seen in 2014. She initially was seen in this practice in 2012.  She states her emotions are out of control and  reports feeling angry, anxious, unhappy, and crying a lot. She says she feels as if she will never be happy again. She also  states she doesn't want to be around people. Her main stressor is her marriage. She and her husband began to experience increased problems in their marriage when son left for college 2 years ago. They have little in common now and often disagree about money and caring for thier possessions. Patient also reports discord related to her lack of interest in sex since she has been menopausal. She states being tired of picking up after her husband and adult son. She reports additional stress related to not settling deceased  father's estate , conflict with her middle sister, and issues at work.  Patient last was seen about 3 weeks ago. She reports continuing to do well overall since last session. However, she says she had a difficult week at work last week. She reports it was stressful as customers were complaining about services. Patient also reports disrupted sleep patterns for the past week as she's been taking care of her sick. At. She also admits decreased efforts regarding positive self-care.  She did complete homework but has some difficulty identifying rules and assumptions.   Suicidal/Homicidal: None   Therapist Response: Reviewed symptoms, facilitated expression of thoughts and feelings, reviewed the role of self care and assisted patient identify ways to improve as well as maintain consistency regarding positive self-care, praised and reinforced completion of homework, processed homework, discussed the difference between core beliefs and rules/assumptions, assisted patient to begin to identify her rules/assumptions and how they were formed, assigned patient handout to complete regarding rules/assumptions and bring to next session  Plan: Return again in 2 weeks.  Diagnosis: Axis I: Major depressive disorder, recurrent, moderate    Axis II: Deferred    Warrene Kapfer, LCSW 10/17/2017

## 2017-10-28 ENCOUNTER — Ambulatory Visit (HOSPITAL_COMMUNITY): Payer: Self-pay | Admitting: Psychiatry

## 2017-11-07 ENCOUNTER — Ambulatory Visit (HOSPITAL_COMMUNITY): Payer: BLUE CROSS/BLUE SHIELD | Admitting: Psychiatry

## 2017-11-07 ENCOUNTER — Encounter (HOSPITAL_COMMUNITY): Payer: Self-pay | Admitting: Psychiatry

## 2017-11-07 DIAGNOSIS — F331 Major depressive disorder, recurrent, moderate: Secondary | ICD-10-CM

## 2017-11-07 NOTE — Progress Notes (Signed)
THERAPIST PROGRESS NOTE             Session Time:  Monday 11/07/2017  8:14 AM - 9:05 AM                         Participation Level: Active         Behavioral Response: Well GroomedAlert/                             Type of Therapy: Individual Therapy  Treatment Goals addressed:   1. Learn and implement conflict resolution skills to resolve interpersonal problems       2. Identify and replace thoughts and beliefs that support depression and anxiety             Interventions: CBT   Summary: Erica Lynch is a 57 y.o. female who is referred for services by PCP Dr. Willey Blade due to patient experiencing symptoms of depression and anxiety. She is a returning patient to this clinician and last was seen in 2014. She initially was seen in this practice in 2012.  She states her emotions are out of control and  reports feeling angry, anxious, unhappy, and crying a lot. She says she feels as if she will never be happy again. She also  states she doesn't want to be around people. Her main stressor is her marriage. She and her husband began to experience increased problems in their marriage when son left for college 2 years ago. They have little in common now and often disagree about money and caring for thier possessions. Patient also reports discord related to her lack of interest in sex since she has been menopausal. She states being tired of picking up after her husband and adult son. She reports additional stress related to not settling deceased  father's estate , conflict with her middle sister, and issues at work.  Patient last was seen about 3 weeks ago. She reports continuing to do well overall since last session. However, she reports increased stress regarding issues with her 30 year old son who recently broke up with his girlfriend. Since the breakup, patient reports son has being going out more with his friends and has not met patient's expectations regarding informing her of when he plans  to return home. She says she has talked with him but admits doing so in an aggressive rather than an assertive manner. She is pleased that she has become more aware of her rules and assumptions regarding completion of tasks at home and work and  has made changes that has been more helpful in her daily functioning. She reports this has been liberating and now having more time to pursue activities she enjoys.   Suicidal/Homicidal: None   Therapist Response: Reviewed symptoms, facilitated expression of thoughts and feelings regarding relationship with son, normalized developmental process and transitions in the relationship, validate feelings, reviewed assertive communication skills, discussed rules and assumptions along with cost and benefits, praised and reinforced patient's efforts to modify unhealthy rules and assumptions,   Plan: Return again in 2 weeks.  Diagnosis: Axis I: Major depressive disorder, recurrent, moderate    Axis II: Deferred    Dori Devino, LCSW 11/07/2017

## 2017-11-14 ENCOUNTER — Ambulatory Visit (HOSPITAL_COMMUNITY): Payer: BLUE CROSS/BLUE SHIELD | Admitting: Psychiatry

## 2017-11-14 ENCOUNTER — Encounter (HOSPITAL_COMMUNITY): Payer: Self-pay | Admitting: Psychiatry

## 2017-11-14 VITALS — BP 114/81 | HR 84 | Ht 64.0 in | Wt 193.0 lb

## 2017-11-14 DIAGNOSIS — Z630 Problems in relationship with spouse or partner: Secondary | ICD-10-CM

## 2017-11-14 DIAGNOSIS — F419 Anxiety disorder, unspecified: Secondary | ICD-10-CM | POA: Diagnosis not present

## 2017-11-14 DIAGNOSIS — R12 Heartburn: Secondary | ICD-10-CM | POA: Diagnosis not present

## 2017-11-14 DIAGNOSIS — F331 Major depressive disorder, recurrent, moderate: Secondary | ICD-10-CM

## 2017-11-14 DIAGNOSIS — Z79899 Other long term (current) drug therapy: Secondary | ICD-10-CM

## 2017-11-14 DIAGNOSIS — Z818 Family history of other mental and behavioral disorders: Secondary | ICD-10-CM

## 2017-11-14 DIAGNOSIS — Z811 Family history of alcohol abuse and dependence: Secondary | ICD-10-CM | POA: Diagnosis not present

## 2017-11-14 MED ORDER — BUPROPION HCL ER (XL) 150 MG PO TB24: 150.0000 mg | ORAL_TABLET | Freq: Every day | ORAL | 5 refills | Status: DC

## 2017-11-14 MED ORDER — CLONAZEPAM 1 MG PO TABS: 1.0000 mg | ORAL_TABLET | Freq: Two times a day (BID) | ORAL | 5 refills | Status: AC | PRN

## 2017-11-14 NOTE — Progress Notes (Signed)
BH MD/PA/NP OP Progress Note  11/14/2017 8:40 AM Erica Lynch  MRN:  [id number]  Chief Complaint:  Chief Complaint    Depression; Anxiety; Follow-up     HPI: This patient is a 57 year old married white female who lives with her husband in [place]. 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  The patient was originally referred by her primary care physician, Dr. Willey Blade, to see Maurice Small for counseling. She in turn has referred her to me for medication management of depression and anxiety.  The patient states that her mood problems started during the first year after she had her son 20 years ago. She felt sad down and was having crying spells. She was also having marital problems. At the time her husband was acting in an immature fashion, going out with friends a lot drinking and leaving her alone with the baby. She never had treatment for this at the time.  Over the years her depression worsened at times. Her mother died in 2006-01-21 from cancer which caused her to feel worse. Her primary physician put her on Zoloft and later Cymbalta and most recently on Prozac. Her father was an alcoholic who died in 4235 from complications of alcoholism. In January 21, 2013 she decided try going off Prozac but got very depressed without it and went back on. She's now on accommodation of Prozac and Wellbutrin.  Despite the 2 antidepressants the patient feels sad all the time. Her energy isn't motivation are low. She states that she is able to function at home and work but just doesn't enjoy anything. She sleeps but only when she takes clonazepam at bedtime. Her marriage is still not going well and she feels that she and her husband have little in common. He still doesn't help much around the house and would rather spend time with his mother than a dinner with her. She feels alone much of the time. She does have one close friend that she does things with. She tries to exercise 2-3 times a week.  She has never been suicidal has never had psychotic symptoms or previous psychiatric treatment.  The patient returns after 4 months.  She states that she is doing quite well.  She is coming regularly to see therapist Maurice Small and this is helped tremendously.  She and her husband are getting along better.  She could not take the trazodone because it caused stomach pain.  She has been having more GI problems related to heartburn.  However overall she is sleeping better because she is no longer watching TV before bedtime and she is trying to exercise more.  She states that her mood is good on the Wellbutrin and the clonazepam helps her get to sleep at night. Visit Diagnosis:    ICD-10-CM   1. Major depressive disorder, recurrent episode, moderate (HCC) F33.1     Past Psychiatric History: none  Past Medical History:  Past Medical History:  Diagnosis Date  . Anxiety   . Depression   . Fatigue 12/04/2015  . Hematuria 05/22/2014  . History of breast lump 02/19/2014  . Hot flashes 12/04/2015  . Hyperlipidemia   . IBS (irritable bowel syndrome)   . Left breast mass 11/07/2013  . LLQ pain 05/22/2014  . Lumbar herniated disc November 2016  . Other and unspecified ovarian cyst 05/23/2014  . PONV (postoperative nausea and vomiting)   . Unspecified symptom associated with female genital organs 05/22/2014  . Vitamin D deficiency 12/05/2015    Past  Surgical History:  Procedure Laterality Date  . CESAREAN SECTION  1996  . COLONOSCOPY  08/2011   Dr. Gala Romney: normal  . ENDOMETRIAL ABLATION  2-3 yrs ago   [facility]  . OTHER SURGICAL HISTORY     Uterine ablation  . TUBAL LIGATION  6 yrs ago   [place] Psychiatric History: See below  Family History:  Family History  Problem Relation Age of Onset  . Lung cancer Mother   . Breast cancer Mother   . Cancer Mother        breast  . Depression Mother   . Cirrhosis Father        etoh   . Anxiety disorder Father   . Alcohol abuse Father    . Depression Father   . Diabetes Maternal Grandfather   . Heart disease Maternal Grandfather   . Depression Maternal Grandmother   . Depression Maternal Uncle   . Depression Maternal Uncle   . Anesthesia problems Neg Hx   . Hypotension Neg Hx   . Malignant hyperthermia Neg Hx   . Pseudochol deficiency Neg Hx     Social History:  Social History   Socioeconomic History  . Marital status: Married    Spouse name: None  . Number of children: 1  . Years of education: None  . Highest education level: None  Social Needs  . Financial resource strain: None  . Food insecurity - worry: None  . Food insecurity - inability: None  . Transportation needs - medical: None  . Transportation needs - non-medical: None  Occupational History  . Occupation: Artist: [employer]  Tobacco Use  . Smoking status: Never Smoker  . Smokeless tobacco: Never Used  Substance and Sexual Activity  . Alcohol use: Yes    Comment: glass of wine once a month sometime not at all  . Drug use: No  . Sexual activity: Yes    Birth control/protection: Surgical    Comment: tubal and ablation  Other Topics Concern  . None  Social History Narrative   1 son-16          Allergies: No Known Allergies  Metabolic Disorder Labs: Lab Results  Component Value Date   HGBA1C 5.8 (H) 12/04/2015   No results found for: PROLACTIN Lab Results  Component Value Date   CHOL 207 (H) 12/04/2015   TRIG 94 12/04/2015   HDL 54 12/04/2015   CHOLHDL 3.8 12/04/2015   LDLCALC 134 (H) 12/04/2015   Lab Results  Component Value Date   TSH 3.040 12/04/2015    Therapeutic Level Labs: No results found for: LITHIUM No results found for: VALPROATE No components found for:  CBMZ  Current Medications: Current Outpatient Medications  Medication Sig Dispense Refill  . buPROPion (WELLBUTRIN XL) 150 MG 24 hr tablet Take 1 tablet (150 mg total) by mouth daily. 30 tablet 5  . Cholecalciferol 5000  units capsule Take 1 capsule (5,000 Units total) by mouth daily.    . clonazePAM (KLONOPIN) 1 MG tablet Take 1 tablet (1 mg total) by mouth 2 (two) times daily as needed for anxiety. 60 tablet 5  . estradiol (ESTRACE VAGINAL) 0.1 MG/GM vaginal cream Use 1/2 to 1 gm daily for 2 weeks then 2-3 x weekly prn 42.5 g 1  . estrogens, conjugated, (PREMARIN) 0.625 MG tablet Take 1 tablet (0.625 mg total) by mouth daily. 30 tablet 12  . pantoprazole (PROTONIX) 20 MG tablet Take  1 tablet (20 mg total) by mouth daily. 30 tablet 0  . progesterone (PROMETRIUM) 200 MG capsule Take 1 capsule (200 mg total) by mouth daily. 30 capsule 12   No current facility-administered medications for this visit.      Musculoskeletal: Strength & Muscle Tone: within normal limits Gait & Station: normal Patient leans: N/A  Psychiatric Specialty Exam: Review of Systems  Gastrointestinal: Positive for heartburn.  All other systems reviewed and are negative.   Blood pressure 114/81, pulse 84, height 5\' 4"  (1.626 m), weight 193 lb (87.5 kg), SpO2 96 %.Body mass index is 33.13 kg/m.  General Appearance: Casual, Neat and Well Groomed  Eye Contact:  Good  Speech:  Clear and Coherent  Volume:  Normal  Mood:  Euthymic  Affect:  Appropriate  Thought Process:  Goal Directed  Orientation:  Full (Time, Place, and Person)  Thought Content: WDL   Suicidal Thoughts:  No  Homicidal Thoughts:  No  Memory:  Immediate;   Good Recent;   Good Remote;   Good  Judgement:  Good  Insight:  Good  Psychomotor Activity:  Normal  Concentration:  Concentration: Good and Attention Span: Good  Recall:  Good  Fund of Knowledge: Good  Language: Good  Akathisia:  No  Handed:  Right  AIMS (if indicated): not done  Assets:  Communication Skills Desire for Improvement Physical Health Resilience Social Support Talents/Skills  ADL's:  Intact  Cognition: WNL  Sleep:  Good   Screenings: PHQ2-9     Office Visit from 12/21/2016 in  [facility] Office Visit from 10/20/2016 in [place] Office Visit from 09/23/2016 in [facility]  PHQ-2 Total Score  0  2  6  PHQ-9 Total Score  No data  9  15       Assessment and Plan: Patient is a 57 year old female with a history of depression and anxiety.  She states at the present time she is doing quite well on her current regimen.  She will continue Wellbutrin XL 150 mg daily for depression and clonazepam 1 mg 2 times daily as needed for anxiety.  She will return to see me at 6 months.   Levonne Spiller, MD 11/14/2017, 8:40 AM

## 2017-11-15 ENCOUNTER — Other Ambulatory Visit: Payer: Self-pay

## 2017-11-15 ENCOUNTER — Encounter (HOSPITAL_COMMUNITY)
Admission: RE | Admit: 2017-11-15 | Discharge: 2017-11-15 | Disposition: A | Discharge status: Home / Self Care | Payer: BLUE CROSS/BLUE SHIELD | Source: Ambulatory Visit | Attending: Internal Medicine | Admitting: Internal Medicine

## 2017-11-15 ENCOUNTER — Encounter: Payer: Self-pay | Admitting: Gastroenterology

## 2017-11-15 ENCOUNTER — Encounter (HOSPITAL_COMMUNITY): Payer: Self-pay

## 2017-11-15 ENCOUNTER — Ambulatory Visit: Payer: BLUE CROSS/BLUE SHIELD | Admitting: Gastroenterology

## 2017-11-15 ENCOUNTER — Telehealth: Payer: Self-pay

## 2017-11-15 DIAGNOSIS — F419 Anxiety disorder, unspecified: Secondary | ICD-10-CM | POA: Insufficient documentation

## 2017-11-15 DIAGNOSIS — Z818 Family history of other mental and behavioral disorders: Secondary | ICD-10-CM | POA: Diagnosis not present

## 2017-11-15 DIAGNOSIS — K589 Irritable bowel syndrome without diarrhea: Secondary | ICD-10-CM | POA: Insufficient documentation

## 2017-11-15 DIAGNOSIS — Z833 Family history of diabetes mellitus: Secondary | ICD-10-CM | POA: Insufficient documentation

## 2017-11-15 DIAGNOSIS — K219 Gastro-esophageal reflux disease without esophagitis: Secondary | ICD-10-CM | POA: Diagnosis not present

## 2017-11-15 DIAGNOSIS — Z801 Family history of malignant neoplasm of trachea, bronchus and lung: Secondary | ICD-10-CM | POA: Insufficient documentation

## 2017-11-15 DIAGNOSIS — F329 Major depressive disorder, single episode, unspecified: Secondary | ICD-10-CM | POA: Insufficient documentation

## 2017-11-15 DIAGNOSIS — Z803 Family history of malignant neoplasm of breast: Secondary | ICD-10-CM | POA: Diagnosis not present

## 2017-11-15 DIAGNOSIS — Z811 Family history of alcohol abuse and dependence: Secondary | ICD-10-CM | POA: Insufficient documentation

## 2017-11-15 DIAGNOSIS — Z01812 Encounter for preprocedural laboratory examination: Secondary | ICD-10-CM | POA: Insufficient documentation

## 2017-11-15 DIAGNOSIS — Z8249 Family history of ischemic heart disease and other diseases of the circulatory system: Secondary | ICD-10-CM | POA: Diagnosis not present

## 2017-11-15 DIAGNOSIS — E785 Hyperlipidemia, unspecified: Secondary | ICD-10-CM | POA: Insufficient documentation

## 2017-11-15 DIAGNOSIS — Z79899 Other long term (current) drug therapy: Secondary | ICD-10-CM | POA: Diagnosis not present

## 2017-11-15 DIAGNOSIS — Z800 Family history of malignant neoplasm of digestive organs: Secondary | ICD-10-CM | POA: Insufficient documentation

## 2017-11-15 DIAGNOSIS — Z9889 Other specified postprocedural states: Secondary | ICD-10-CM | POA: Insufficient documentation

## 2017-11-15 HISTORY — DX: Gastro-esophageal reflux disease without esophagitis: K21.9

## 2017-11-15 LAB — BASIC METABOLIC PANEL
ANION GAP: 10 (ref 5–15)
BUN: 18 mg/dL (ref 6–20)
CHLORIDE: 104 mmol/L (ref 101–111)
CO2: 25 mmol/L (ref 22–32)
CREATININE: 0.98 mg/dL (ref 0.44–1.00)
Calcium: 9.1 mg/dL (ref 8.9–10.3)
GFR calc non Af Amer: >60 mL/min (ref >60)
Glucose, Bld: 97 mg/dL (ref 65–99)
POTASSIUM: 3.8 mmol/L (ref 3.5–5.1)
SODIUM: 139 mmol/L (ref 135–145)

## 2017-11-15 LAB — CBC
HCT: 42.5 % (ref 36.0–46.0)
HEMOGLOBIN: 13.6 g/dL (ref 12.0–15.0)
MCH: 30.4 pg (ref 26.0–34.0)
MCHC: 32 g/dL (ref 30.0–36.0)
MCV: 94.9 fL (ref 78.0–100.0)
Platelets: 319 10*3/uL (ref 150–400)
RBC: 4.48 MIL/uL (ref 3.87–5.11)
RDW: 12.1 % (ref 11.5–15.5)
WBC: 8 10*3/uL (ref 4.0–10.5)

## 2017-11-15 LAB — HCG, SERUM, QUALITATIVE: PREG SERUM: NEGATIVE

## 2017-11-15 NOTE — Telephone Encounter (Signed)
Endo scheduler called office. She called pt. Pt is on the way to have her pre-op appt at this time.

## 2017-11-15 NOTE — Patient Instructions (Signed)
Erica Lynch  11/15/2017     @PREFPERIOPPHARMACY @   Your procedure is scheduled on 11/21/2017.  Report to [facility] at 10:00 A.M.  Call this number if you have problems the morning of surgery:  [phone]   Remember:  Do not eat food or drink liquids after midnight.  Take these medicines the morning of surgery with A SIP OF WATER : Wellbutrin, Klonopin, Premarin and Protonix   Do not wear jewelry, make-up or nail polish.  Do not wear lotions, powders, or perfumes, or deodorant.  Do not shave 48 hours prior to surgery.  Men may shave face and neck.  Do not bring valuables to the hospital.  [facility] is not responsible for any belongings or valuables.  Contacts, dentures or bridgework may not be worn into surgery.  Leave your suitcase in the car.  After surgery it may be brought to your room.  For patients admitted to the hospital, discharge time will be determined by your treatment team.  Patients discharged the day of surgery will not be allowed to drive home.   Name and phone number of your driver:   family Special instructions:  n/a  Please read over the following fact sheets that you were given. Care and Recovery After Surgery   Gastroesophageal Reflux Disease, Adult Normally, food travels down the esophagus and stays in the stomach to be digested. However, when a person has gastroesophageal reflux disease (GERD), food and stomach acid move back up into the esophagus. When this happens, the esophagus becomes sore and inflamed. Over time, GERD can create small holes (ulcers) in the lining of the esophagus. What are the causes? This condition is caused by a problem with the muscle between the esophagus and the stomach (lower esophageal sphincter, or LES). Normally, the LES muscle closes after food passes through the esophagus to the stomach. When the LES is weakened or abnormal, it does not close properly, and that allows food and stomach acid to go back up into the  esophagus. The LES can be weakened by certain dietary substances, medicines, and medical conditions, including:  Tobacco use.  Pregnancy.  Having a hiatal hernia.  Heavy alcohol use.  Certain foods and beverages, such as coffee, chocolate, onions, and peppermint.  What increases the risk? This condition is more likely to develop in:  People who have an increased body weight.  People who have connective tissue disorders.  People who use NSAID medicines.  What are the signs or symptoms? Symptoms of this condition include:  Heartburn.  Difficult or painful swallowing.  The feeling of having a lump in the throat.  Abitter taste in the mouth.  Bad breath.  Having a large amount of saliva.  Having an upset or bloated stomach.  Belching.  Chest pain.  Shortness of breath or wheezing.  Ongoing (chronic) cough or a night-time cough.  Wearing away of tooth enamel.  Weight loss.  Different conditions can cause chest pain. Make sure to see your health care provider if you experience chest pain. How is this diagnosed? Your health care provider will take a medical history and perform a physical exam. To determine if you have mild or severe GERD, your health care provider may also monitor how you respond to treatment. You may also have other tests, including:  An endoscopy toexamine your stomach and esophagus with a small camera.  A test thatmeasures the acidity level in your esophagus.  A test thatmeasures how much pressure is on your  esophagus.  A barium swallow or modified barium swallow to show the shape, size, and functioning of your esophagus.  How is this treated? The goal of treatment is to help relieve your symptoms and to prevent complications. Treatment for this condition may vary depending on how severe your symptoms are. Your health care provider may recommend:  Changes to your diet.  Medicine.  Surgery.  Follow these instructions at  home: Diet  Follow a diet as recommended by your health care provider. This may involve avoiding foods and drinks such as: ? Coffee and tea (with or without caffeine). ? Drinks that containalcohol. ? Energy drinks and sports drinks. ? Carbonated drinks or sodas. ? Chocolate and cocoa. ? Peppermint and mint flavorings. ? Garlic and onions. ? Horseradish. ? Spicy and acidic foods, including peppers, chili powder, curry powder, vinegar, hot sauces, and barbecue sauce. ? Citrus fruit juices and citrus fruits, such as oranges, lemons, and limes. ? Tomato-based foods, such as red sauce, chili, salsa, and pizza with red sauce. ? Fried and fatty foods, such as donuts, french fries, potato chips, and high-fat dressings. ? High-fat meats, such as hot dogs and fatty cuts of red and white meats, such as rib eye steak, sausage, ham, and bacon. ? High-fat dairy items, such as whole milk, butter, and cream cheese.  Eat small, frequent meals instead of large meals.  Avoid drinking large amounts of liquid with your meals.  Avoid eating meals during the 2-3 hours before bedtime.  Avoid lying down right after you eat.  Do not exercise right after you eat. General instructions  Pay attention to any changes in your symptoms.  Take over-the-counter and prescription medicines only as told by your health care provider. Do not take aspirin, ibuprofen, or other NSAIDs unless your health care provider told you to do so.  Do not use any tobacco products, including cigarettes, chewing tobacco, and e-cigarettes. If you need help quitting, ask your health care provider.  Wear loose-fitting clothing. Do not wear anything tight around your waist that causes pressure on your abdomen.  Raise (elevate) the head of your bed 6 inches (15cm).  Try to reduce your stress, such as with yoga or meditation. If you need help reducing stress, ask your health care provider.  If you are overweight, reduce your weight to  an amount that is healthy for you. Ask your health care provider for guidance about a safe weight loss goal.  Keep all follow-up visits as told by your health care provider. This is important. Contact a health care provider if:  You have new symptoms.  You have unexplained weight loss.  You have difficulty swallowing, or it hurts to swallow.  You have wheezing or a persistent cough.  Your symptoms do not improve with treatment.  You have a hoarse voice. Get help right away if:  You have pain in your arms, neck, jaw, teeth, or back.  You feel sweaty, dizzy, or light-headed.  You have chest pain or shortness of breath.  You vomit and your vomit looks like blood or coffee grounds.  You faint.  Your stool is bloody or black.  You cannot swallow, drink, or eat. This information is not intended to replace advice given to you by your health care provider. Make sure you discuss any questions you have with your health care provider. Document Released: 06/23/2005 Document Revised: 02/11/2016 Document Reviewed: 01/08/2015 Elsevier Interactive Patient Education  2018 [employer]. Esophagogastroduodenoscopy, Care After Refer to this sheet in  the next few weeks. These instructions provide you with information about caring for yourself after your procedure. Your health care provider may also give you more specific instructions. Your treatment has been planned according to current medical practices, but problems sometimes occur. Call your health care provider if you have any problems or questions after your procedure. What can I expect after the procedure? After the procedure, it is common to have:  A sore throat.  Nausea.  Bloating.  Dizziness.  Fatigue.  Follow these instructions at home:  Do not eat or drink anything until the numbing medicine (local anesthetic) has worn off and your gag reflex has returned. You will know that the local anesthetic has worn off when you can  swallow comfortably.  Do not drive for 24 hours if you received a medicine to help you relax (sedative).  If your health care provider took a tissue sample for testing during the procedure, make sure to get your test results. This is your responsibility. Ask your health care provider or the department performing the test when your results will be ready.  Keep all follow-up visits as told by your health care provider. This is important. Contact a health care provider if:  You cannot stop coughing.  You are not urinating.  You are urinating less than usual. Get help right away if:  You have trouble swallowing.  You cannot eat or drink.  You have throat or chest pain that gets worse.  You are dizzy or light-headed.  You faint.  You have nausea or vomiting.  You have chills.  You have a fever.  You have severe abdominal pain.  You have black, tarry, or bloody stools. This information is not intended to replace advice given to you by your health care provider. Make sure you discuss any questions you have with your health care provider. Document Released: 08/30/2012 Document Revised: 02/19/2016 Document Reviewed: 08/07/2015 Elsevier Interactive Patient Education  [employer].

## 2017-11-15 NOTE — H&P (View-Only) (Signed)
Primary Care Physician:  Asencion Noble, MD  Primary Gastroenterologist:  Garfield Cornea, MD   Chief Complaint  Patient presents with  . Gastroesophageal Reflux    HPI:  Erica Lynch is a 57 y.o. female here at the request of Dr. Willey Blade for further evaluation of refractory GERD.  Patient has history of only occasional heartburn often on in the past, easily managed with over-the-counter antacids. Really has done very well up until around November when she woke up in the middle the night with chest pain radiating into her back. She ultimately went to the ED and was suspected to have reflux related chest pain. She was started on Protonix 20 mg daily. She followed up with Dr. Willey Blade who increased her to 20 mg BID. Also added Mylanta as needed.  Although her sharp stabbing pains have resolved, she continues to have nocturnal symptoms at least couple times per week. Described as burning quality associated with nausea. During the episode she has difficulty swallowing. No vomiting or abdominal pain. During waking hours, she is less symptomatic. Denies dysphagia with meals. With nocturnal symptoms she does develop coughing and hoarseness. Symptoms will persist into the following morning and eventually resolved. Bowel movements are regular. No blood in the stool or melena.  She tries not to eat and lay down for several hours. Sometimes her nocturnal symptoms occur even if she hasn't really eaten anything.   No NSIADS/ASA.   Current Outpatient Medications  Medication Sig Dispense Refill  . buPROPion (WELLBUTRIN XL) 150 MG 24 hr tablet Take 1 tablet (150 mg total) by mouth daily. 30 tablet 5  . Cholecalciferol 5000 units capsule Take 1 capsule (5,000 Units total) by mouth daily.    . clonazePAM (KLONOPIN) 1 MG tablet Take 1 tablet (1 mg total) by mouth 2 (two) times daily as needed for anxiety. 60 tablet 5  . estradiol (ESTRACE VAGINAL) 0.1 MG/GM vaginal cream Use 1/2 to 1 gm daily for 2 weeks then 2-3 x  weekly prn 42.5 g 1  . estrogens, conjugated, (PREMARIN) 0.625 MG tablet Take 1 tablet (0.625 mg total) by mouth daily. 30 tablet 12  . pantoprazole (PROTONIX) 20 MG tablet Take 20 mg by mouth 2 (two) times daily.    . progesterone (PROMETRIUM) 200 MG capsule Take 1 capsule (200 mg total) by mouth daily. 30 capsule 12   No current facility-administered medications for this visit.     Allergies as of 11/15/2017  . (No Known Allergies)    Past Medical History:  Diagnosis Date  . Anxiety   . Depression   . Fatigue 12/04/2015  . Hematuria 05/22/2014  . History of breast lump 02/19/2014  . Hot flashes 12/04/2015  . Hyperlipidemia   . IBS (irritable bowel syndrome)   . Left breast mass 11/07/2013  . LLQ pain 05/22/2014  . Lumbar herniated disc November 2016  . Other and unspecified ovarian cyst 05/23/2014  . PONV (postoperative nausea and vomiting)   . Unspecified symptom associated with female genital organs 05/22/2014  . Vitamin D deficiency 12/05/2015    Past Surgical History:  Procedure Laterality Date  . CESAREAN SECTION  1996  . COLONOSCOPY  08/2011   Dr. Gala Romney: normal  . ENDOMETRIAL ABLATION  2-3 yrs ago   [facility]  . OTHER SURGICAL HISTORY     Uterine ablation  . TUBAL LIGATION  6 yrs ago   [facility]    Family History  Problem Relation Age of Onset  . Lung cancer Mother  deceased from lung cancer at age 89  . Breast cancer Mother   . Cancer Mother        breast at age 55  . Depression Mother   . Cirrhosis Father        etoh   . Anxiety disorder Father   . Alcohol abuse Father   . Depression Father   . Diabetes Maternal Grandfather   . Heart disease Maternal Grandfather   . Depression Maternal Grandmother   . Depression Maternal Uncle   . Depression Maternal Uncle   . Anesthesia problems Neg Hx   . Hypotension Neg Hx   . Malignant hyperthermia Neg Hx   . Pseudochol deficiency Neg Hx   . Colon cancer Neg Hx     Social History   Socioeconomic  History  . Marital status: Married    Spouse name: Not on file  . Number of children: 1  . Years of education: Not on file  . Highest education level: Not on file  Social Needs  . Financial resource strain: Not on file  . Food insecurity - worry: Not on file  . Food insecurity - inability: Not on file  . Transportation needs - medical: Not on file  . Transportation needs - non-medical: Not on file  Occupational History  . Occupation: Artist: [employer]  Tobacco Use  . Smoking status: Never Smoker  . Smokeless tobacco: Never Used  Substance and Sexual Activity  . Alcohol use: No    Frequency: Never  . Drug use: No  . Sexual activity: Yes    Birth control/protection: Surgical    Comment: tubal and ablation  Other Topics Concern  . Not on file  Social History Narrative   1 son-16            ROS:  General: Negative for anorexia, weight loss, fever, chills, fatigue, weakness. Eyes: Negative for vision changes.  ENT: Negative for hoarseness, difficulty swallowing , nasal congestion. CV: Negative for chest pain, angina, palpitations, dyspnea on exertion, peripheral edema.  Respiratory: Negative for dyspnea at rest, dyspnea on exertion, cough, sputum, wheezing.  GI: See history of present illness. GU:  Negative for dysuria, hematuria, urinary incontinence, urinary frequency, nocturnal urination.  MS: Negative for joint pain, low back pain.  Derm: Negative for rash or itching.  Neuro: Negative for weakness, abnormal sensation, seizure, frequent headaches, memory loss, confusion.  Psych: Negative for anxiety, depression, suicidal ideation, hallucinations.  Endo: Negative for unusual weight change.  Heme: Negative for bruising or bleeding. Allergy: Negative for rash or hives.    Physical Examination:  BP 115/78   Pulse 76   Temp 97.6 F (36.4 C) (Oral)   Ht 5\' 4"  (1.626 m)   Wt 194 lb 12.8 oz (88.4 kg)   BMI 33.44 kg/m    General:  Well-nourished, well-developed in no acute distress.  Head: Normocephalic, atraumatic.   Eyes: Conjunctiva pink, no icterus. Mouth: Oropharyngeal mucosa moist and pink , no lesions erythema or exudate. Neck: Supple without thyromegaly, masses, or lymphadenopathy.  Lungs: Clear to auscultation bilaterally.  Heart: Regular rate and rhythm, no murmurs rubs or gallops.  Abdomen: Bowel sounds are normal, nontender, nondistended, no hepatosplenomegaly or masses, no abdominal bruits or    hernia , no rebound or guarding.   Rectal: not performed Extremities: No lower extremity edema. No clubbing or deformities.  Neuro: Alert and oriented x 4 , grossly normal neurologically.  Skin: Warm and  dry, no rash or jaundice.   Psych: Alert and cooperative, normal mood and affect.  Labs: Lab Results  Component Value Date   CREATININE 1.04 (H) 08/23/2017   BUN 16 08/23/2017   NA 138 08/23/2017   K 3.9 08/23/2017   CL 105 08/23/2017   CO2 27 08/23/2017   Lab Results  Component Value Date   WBC 8.3 08/23/2017   HGB 14.5 08/23/2017   HCT 45.1 08/23/2017   MCV 96.2 08/23/2017   PLT 318 08/23/2017   Lab Results  Component Value Date   LIPASE 28 08/23/2017   Lab Results  Component Value Date   ALT 16 08/23/2017   AST 20 08/23/2017   ALKPHOS 53 08/23/2017   BILITOT 0.5 08/23/2017     Imaging Studies: No results found.

## 2017-11-15 NOTE — Assessment & Plan Note (Signed)
57 year old female presenting with nocturnal chest pain, reflux, heartburn all which occur predominantly in nighttime. Partial response to PPI therapy. Complains of dysphagia only during her episodes. Never has any problems swallowing food. Doubt esophageal structure, symptoms likely related to reflux esophagitis and or esophageal spasms. Given persistent symptoms despite PPI therapy, new onset symptoms at age greater than 30, EGD is warranted at this time. Due to polypharmacy plan for deep sedation with propofol.  I have discussed the risks, alternatives, benefits with regards to but not limited to the risk of reaction to medication, bleeding, infection, perforation and the patient is agreeable to proceed. Written consent to be obtained.  In the interim, she can double up on her Protonix to 40 mg especially in the mornings after episode of nocturnal reflux, continue 20 mg in the evening. Ultimately may require a different PPI that she would like to postpone changes until after her upcoming endoscopy.

## 2017-11-15 NOTE — Progress Notes (Signed)
Primary Care Physician:  Asencion Noble, MD  Primary Gastroenterologist:  Garfield Cornea, MD   Chief Complaint  Patient presents with  . Gastroesophageal Reflux    HPI:  Erica Lynch is a 57 y.o. female here at the request of Dr. Willey Blade for further evaluation of refractory GERD.  Patient has history of only occasional heartburn often on in the past, easily managed with over-the-counter antacids. Really has done very well up until around November when she woke up in the middle the night with chest pain radiating into her back. She ultimately went to the ED and was suspected to have reflux related chest pain. She was started on Protonix 20 mg daily. She followed up with Dr. Willey Blade who increased her to 20 mg BID. Also added Mylanta as needed.  Although her sharp stabbing pains have resolved, she continues to have nocturnal symptoms at least couple times per week. Described as burning quality associated with nausea. During the episode she has difficulty swallowing. No vomiting or abdominal pain. During waking hours, she is less symptomatic. Denies dysphagia with meals. With nocturnal symptoms she does develop coughing and hoarseness. Symptoms will persist into the following morning and eventually resolved. Bowel movements are regular. No blood in the stool or melena.  She tries not to eat and lay down for several hours. Sometimes her nocturnal symptoms occur even if she hasn't really eaten anything.   No NSIADS/ASA.   Current Outpatient Medications  Medication Sig Dispense Refill  . buPROPion (WELLBUTRIN XL) 150 MG 24 hr tablet Take 1 tablet (150 mg total) by mouth daily. 30 tablet 5  . Cholecalciferol 5000 units capsule Take 1 capsule (5,000 Units total) by mouth daily.    . clonazePAM (KLONOPIN) 1 MG tablet Take 1 tablet (1 mg total) by mouth 2 (two) times daily as needed for anxiety. 60 tablet 5  . estradiol (ESTRACE VAGINAL) 0.1 MG/GM vaginal cream Use 1/2 to 1 gm daily for 2 weeks then 2-3 x  weekly prn 42.5 g 1  . estrogens, conjugated, (PREMARIN) 0.625 MG tablet Take 1 tablet (0.625 mg total) by mouth daily. 30 tablet 12  . pantoprazole (PROTONIX) 20 MG tablet Take 20 mg by mouth 2 (two) times daily.    . progesterone (PROMETRIUM) 200 MG capsule Take 1 capsule (200 mg total) by mouth daily. 30 capsule 12   No current facility-administered medications for this visit.     Allergies as of 11/15/2017  . (No Known Allergies)    Past Medical History:  Diagnosis Date  . Anxiety   . Depression   . Fatigue 12/04/2015  . Hematuria 05/22/2014  . History of breast lump 02/19/2014  . Hot flashes 12/04/2015  . Hyperlipidemia   . IBS (irritable bowel syndrome)   . Left breast mass 11/07/2013  . LLQ pain 05/22/2014  . Lumbar herniated disc November 2016  . Other and unspecified ovarian cyst 05/23/2014  . PONV (postoperative nausea and vomiting)   . Unspecified symptom associated with female genital organs 05/22/2014  . Vitamin D deficiency 12/05/2015    Past Surgical History:  Procedure Laterality Date  . CESAREAN SECTION  1996  . COLONOSCOPY  08/2011   Dr. Gala Romney: normal  . ENDOMETRIAL ABLATION  2-3 yrs ago   [facility]  . OTHER SURGICAL HISTORY     Uterine ablation  . TUBAL LIGATION  6 yrs ago   [facility]    Family History  Problem Relation Age of Onset  . Lung cancer Mother  deceased from lung cancer at age 48  . Breast cancer Mother   . Cancer Mother        breast at age 101  . Depression Mother   . Cirrhosis Father        etoh   . Anxiety disorder Father   . Alcohol abuse Father   . Depression Father   . Diabetes Maternal Grandfather   . Heart disease Maternal Grandfather   . Depression Maternal Grandmother   . Depression Maternal Uncle   . Depression Maternal Uncle   . Anesthesia problems Neg Hx   . Hypotension Neg Hx   . Malignant hyperthermia Neg Hx   . Pseudochol deficiency Neg Hx   . Colon cancer Neg Hx     Social History   Socioeconomic  History  . Marital status: Married    Spouse name: Not on file  . Number of children: 1  . Years of education: Not on file  . Highest education level: Not on file  Social Needs  . Financial resource strain: Not on file  . Food insecurity - worry: Not on file  . Food insecurity - inability: Not on file  . Transportation needs - medical: Not on file  . Transportation needs - non-medical: Not on file  Occupational History  . Occupation: Artist: [employer]  Tobacco Use  . Smoking status: Never Smoker  . Smokeless tobacco: Never Used  Substance and Sexual Activity  . Alcohol use: No    Frequency: Never  . Drug use: No  . Sexual activity: Yes    Birth control/protection: Surgical    Comment: tubal and ablation  Other Topics Concern  . Not on file  Social History Narrative   1 son-16            ROS:  General: Negative for anorexia, weight loss, fever, chills, fatigue, weakness. Eyes: Negative for vision changes.  ENT: Negative for hoarseness, difficulty swallowing , nasal congestion. CV: Negative for chest pain, angina, palpitations, dyspnea on exertion, peripheral edema.  Respiratory: Negative for dyspnea at rest, dyspnea on exertion, cough, sputum, wheezing.  GI: See history of present illness. GU:  Negative for dysuria, hematuria, urinary incontinence, urinary frequency, nocturnal urination.  MS: Negative for joint pain, low back pain.  Derm: Negative for rash or itching.  Neuro: Negative for weakness, abnormal sensation, seizure, frequent headaches, memory loss, confusion.  Psych: Negative for anxiety, depression, suicidal ideation, hallucinations.  Endo: Negative for unusual weight change.  Heme: Negative for bruising or bleeding. Allergy: Negative for rash or hives.    Physical Examination:  BP 115/78   Pulse 76   Temp 97.6 F (36.4 C) (Oral)   Ht 5\' 4"  (1.626 m)   Wt 194 lb 12.8 oz (88.4 kg)   BMI 33.44 kg/m    General:  Well-nourished, well-developed in no acute distress.  Head: Normocephalic, atraumatic.   Eyes: Conjunctiva pink, no icterus. Mouth: Oropharyngeal mucosa moist and pink , no lesions erythema or exudate. Neck: Supple without thyromegaly, masses, or lymphadenopathy.  Lungs: Clear to auscultation bilaterally.  Heart: Regular rate and rhythm, no murmurs rubs or gallops.  Abdomen: Bowel sounds are normal, nontender, nondistended, no hepatosplenomegaly or masses, no abdominal bruits or    hernia , no rebound or guarding.   Rectal: not performed Extremities: No lower extremity edema. No clubbing or deformities.  Neuro: Alert and oriented x 4 , grossly normal neurologically.  Skin: Warm and  dry, no rash or jaundice.   Psych: Alert and cooperative, normal mood and affect.  Labs: Lab Results  Component Value Date   CREATININE 1.04 (H) 08/23/2017   BUN 16 08/23/2017   NA 138 08/23/2017   K 3.9 08/23/2017   CL 105 08/23/2017   CO2 27 08/23/2017   Lab Results  Component Value Date   WBC 8.3 08/23/2017   HGB 14.5 08/23/2017   HCT 45.1 08/23/2017   MCV 96.2 08/23/2017   PLT 318 08/23/2017   Lab Results  Component Value Date   LIPASE 28 08/23/2017   Lab Results  Component Value Date   ALT 16 08/23/2017   AST 20 08/23/2017   ALKPHOS 53 08/23/2017   BILITOT 0.5 08/23/2017     Imaging Studies: No results found.

## 2017-11-15 NOTE — Patient Instructions (Signed)
1. Upper endoscopy as scheduled. See separate instructions.  2. You can double up on your protonix, taking up to 40mg  in AM and 20mg  in evening (preferably before meals).

## 2017-11-15 NOTE — Progress Notes (Signed)
CC'D TO PCP °

## 2017-11-21 ENCOUNTER — Encounter (HOSPITAL_COMMUNITY)
Admission: RE | Disposition: A | Discharge status: Home / Self Care | Payer: Self-pay | Source: Ambulatory Visit | Attending: Internal Medicine

## 2017-11-21 ENCOUNTER — Encounter (HOSPITAL_COMMUNITY): Payer: Self-pay | Admitting: *Deleted

## 2017-11-21 ENCOUNTER — Ambulatory Visit (HOSPITAL_COMMUNITY): Payer: BLUE CROSS/BLUE SHIELD | Admitting: Anesthesiology

## 2017-11-21 ENCOUNTER — Ambulatory Visit (HOSPITAL_COMMUNITY)
Admission: RE | Admit: 2017-11-21 | Discharge: 2017-11-21 | Disposition: A | Discharge status: Home / Self Care | Payer: BLUE CROSS/BLUE SHIELD | Source: Ambulatory Visit | Attending: Internal Medicine | Admitting: Internal Medicine

## 2017-11-21 ENCOUNTER — Telehealth: Payer: Self-pay | Admitting: Internal Medicine

## 2017-11-21 DIAGNOSIS — Z79890 Hormone replacement therapy (postmenopausal): Secondary | ICD-10-CM | POA: Diagnosis not present

## 2017-11-21 DIAGNOSIS — I739 Peripheral vascular disease, unspecified: Secondary | ICD-10-CM | POA: Diagnosis not present

## 2017-11-21 DIAGNOSIS — R1310 Dysphagia, unspecified: Secondary | ICD-10-CM | POA: Diagnosis not present

## 2017-11-21 DIAGNOSIS — R12 Heartburn: Secondary | ICD-10-CM

## 2017-11-21 DIAGNOSIS — Z79899 Other long term (current) drug therapy: Secondary | ICD-10-CM | POA: Diagnosis not present

## 2017-11-21 DIAGNOSIS — K449 Diaphragmatic hernia without obstruction or gangrene: Secondary | ICD-10-CM | POA: Insufficient documentation

## 2017-11-21 DIAGNOSIS — K219 Gastro-esophageal reflux disease without esophagitis: Secondary | ICD-10-CM | POA: Diagnosis not present

## 2017-11-21 DIAGNOSIS — F419 Anxiety disorder, unspecified: Secondary | ICD-10-CM | POA: Insufficient documentation

## 2017-11-21 HISTORY — PX: MALONEY DILATION: SHX5535

## 2017-11-21 HISTORY — PX: ESOPHAGOGASTRODUODENOSCOPY (EGD) WITH PROPOFOL: SHX5813

## 2017-11-21 SURGERY — ESOPHAGOGASTRODUODENOSCOPY (EGD) WITH PROPOFOL
Anesthesia: Monitor Anesthesia Care

## 2017-11-21 MED ORDER — LACTATED RINGERS IV SOLN
INTRAVENOUS | Status: DC
  Administered 2017-11-21: 12:00:00 via INTRAVENOUS

## 2017-11-21 MED ORDER — PROPOFOL 500 MG/50ML IV EMUL
INTRAVENOUS | Status: DC | PRN
  Administered 2017-11-21: 150 ug/kg/min via INTRAVENOUS

## 2017-11-21 MED ORDER — LIDOCAINE VISCOUS 2 % MT SOLN
6.0000 mL | Freq: Once | OROMUCOSAL | Status: AC
  Administered 2017-11-21: 6 mL via OROMUCOSAL

## 2017-11-21 MED ORDER — ONDANSETRON 4 MG PO TBDP
4.0000 mg | ORAL_TABLET | Freq: Once | ORAL | Status: AC
  Administered 2017-11-21: 4 mg via ORAL

## 2017-11-21 MED ORDER — PROPOFOL 10 MG/ML IV BOLUS
INTRAVENOUS | Status: AC
  Filled 2017-11-21: qty 60

## 2017-11-21 MED ORDER — PROPOFOL 10 MG/ML IV BOLUS
INTRAVENOUS | Status: DC | PRN
  Administered 2017-11-21 (×4): 20 mg via INTRAVENOUS

## 2017-11-21 MED ORDER — ONDANSETRON 4 MG PO TBDP
ORAL_TABLET | ORAL | Status: AC
  Filled 2017-11-21: qty 1

## 2017-11-21 MED ORDER — CHLORHEXIDINE GLUCONATE CLOTH 2 % EX PADS: 6.0000 | MEDICATED_PAD | Freq: Once | CUTANEOUS | Status: DC

## 2017-11-21 MED ORDER — MIDAZOLAM HCL 2 MG/2ML IJ SOLN
INTRAMUSCULAR | Status: AC
Start: 2017-11-21 — End: ?
  Filled 2017-11-21: qty 2

## 2017-11-21 MED ORDER — MIDAZOLAM HCL 2 MG/2ML IJ SOLN
1.0000 mg | Freq: Once | INTRAMUSCULAR | Status: AC | PRN
  Administered 2017-11-21: 2 mg via INTRAVENOUS

## 2017-11-21 MED ORDER — LIDOCAINE VISCOUS 2 % MT SOLN
OROMUCOSAL | Status: AC
  Filled 2017-11-21: qty 15

## 2017-11-21 NOTE — Anesthesia Postprocedure Evaluation (Signed)
Anesthesia Post Note  Patient: Erica Lynch  Procedure(s) Performed: ESOPHAGOGASTRODUODENOSCOPY (EGD) WITH PROPOFOL (N/A ) MALONEY DILATION (N/A )  Patient location during evaluation: Short Stay Anesthesia Type: MAC Level of consciousness: awake and alert Pain management: satisfactory to patient Vital Signs Assessment: post-procedure vital signs reviewed and stable Respiratory status: spontaneous breathing Cardiovascular status: stable Postop Assessment: no apparent nausea or vomiting Anesthetic complications: no     Last Vitals:  Vitals:   11/21/17 1355 11/21/17 1405  BP: 118/81 121/82  Pulse: 65 74  Resp: 11 12  Temp:  (!) 36.4 C  SpO2: 95%     Last Pain:  Vitals:   11/21/17 1405  TempSrc: Oral                 Orion Mole

## 2017-11-21 NOTE — Telephone Encounter (Signed)
No samples are available at this time. Will call pt when samples are available.

## 2017-11-21 NOTE — Op Note (Signed)
[facility] Patient Name: Erica Lynch Procedure Date: 11/21/2017 1:15 PM MRN: [id number] Date of Birth: 08-28-1961 Attending MD: Norvel Richards , MD CSN: [id number] Age: 57 Admit Type: Outpatient Procedure:                Upper GI endoscopy Indications:              Dysphagia, Heartburn Providers:                Norvel Richards, MD, Jeanann Lewandowsky. Sharon Seller, RN,                            Nelma Rothman, Technician Referring MD:              Medicines:                Propofol per Anesthesia Complications:            No immediate complications. Estimated Blood Loss:     Estimated blood loss: none. Procedure:                Pre-Anesthesia Assessment:                           - Prior to the procedure, a History and Physical                            was performed, and patient medications and                            allergies were reviewed. The patient's tolerance of                            previous anesthesia was also reviewed. The risks                            and benefits of the procedure and the sedation                            options and risks were discussed with the patient.                            All questions were answered, and informed consent                            was obtained. ASA Grade Assessment: II - A patient                            with mild systemic disease. After reviewing the                            risks and benefits, the patient was deemed in                            satisfactory condition to undergo the procedure.  After obtaining informed consent, the endoscope was                            passed under direct vision. Throughout the                            procedure, the patient's blood pressure, pulse, and                            oxygen saturations were monitored continuously. The                            EG-[id number] ([id number]) scope was introduced through the                            and  advanced to the second part of duodenum. The                            upper GI endoscopy was accomplished without                            difficulty. The patient tolerated the procedure                            well. The upper GI endoscopy was accomplished                            without difficulty. The patient tolerated the                            procedure well. Scope In: 1:19:18 PM Scope Out: 1:24:32 PM Total Procedure Duration: 0 hours 5 minutes 14 seconds  Findings:      The examined esophagus was normal. Somewhat patulous EG junction. The       scope was withdrawn. Dilation was performed with a Maloney dilator with       mild resistance at 56 Fr. The dilation site was examined following       endoscope reinsertion and showed no change. Estimated blood loss: none.      A small hiatal hernia was present.      The exam was otherwise without abnormality.      The duodenal bulb and second portion of the duodenum were normal. Impression:               - Normal esophagus. Dilated.                           - Small hiatal hernia.                           - The examination was otherwise normal.                           - Normal duodenal bulb and second portion of the  duodenum.                           - No specimens collected. Moderate Sedation:      Moderate (conscious) sedation was personally administered by an       anesthesia professional. The following parameters were monitored: oxygen       saturation, heart rate, blood pressure, respiratory rate, EKG, adequacy       of pulmonary ventilation, and response to care. Total physician       intraservice time was 13 minutes. Recommendation:           - Patient has a contact number available for                            emergencies. The signs and symptoms of potential                            delayed complications were discussed with the                            patient. Return to normal  activities tomorrow.                            Written discharge instructions were provided to the                            patient.                           - Continue present medications. However, stop                            Protonix; begin a 3 week trial of Dexilant 60 mg                            daily ; Prescription provided. Patient's should go                            you by the office for free samples.                           - No repeat upper endoscopy.                           - Return to GI office in 6 weeks.                           - Resume previous diet and advance diet as                            tolerated. Procedure Code(s):        --- Professional ---                           [id number], Esophagogastroduodenoscopy, flexible,  transoral; diagnostic, including collection of                            specimen(s) by brushing or washing, when performed                            (separate procedure)                           43450, Dilation of esophagus, by unguided sound or                            bougie, single or multiple passes Diagnosis Code(s):        --- Professional ---                           K44.9, Diaphragmatic hernia without obstruction or                            gangrene                           R13.10, Dysphagia, unspecified                           R12, Heartburn CPT copyright 2016 American Medical Association. All rights reserved. The codes documented in this report are preliminary and upon coder review may  be revised to meet current compliance requirements. [name]. [name], MD Norvel Richards, MD 11/21/2017 1:32:31 PM This report has been signed electronically. Number of Addenda: 0

## 2017-11-21 NOTE — Anesthesia Preprocedure Evaluation (Signed)
Anesthesia Evaluation  Patient identified by MRN, date of birth, ID band Patient awake    Airway Mallampati: I  TM Distance: >3 FB     Dental  (+) Teeth Intact   Pulmonary neg pulmonary ROS,    Pulmonary exam normal        Cardiovascular Exercise Tolerance: Poor + Peripheral Vascular Disease  Normal cardiovascular exam Rhythm:Regular Rate:Normal  Sinus rhythm (2018)   Neuro/Psych    GI/Hepatic Neg liver ROS, GERD  Medicated and Controlled,  Endo/Other  negative endocrine ROS  Renal/GU negative Renal ROS     Musculoskeletal   Abdominal Normal abdominal exam  (+)   Peds negative pediatric ROS (+)  Hematology negative hematology ROS (+)   Anesthesia Other Findings   Reproductive/Obstetrics                             Anesthesia Physical Anesthesia Plan  ASA: II  Anesthesia Plan: MAC   Post-op Pain Management:    Induction:   PONV Risk Score and Plan:   Airway Management Planned: Simple Face Mask  Additional Equipment:   Intra-op Plan:   Post-operative Plan:   Informed Consent: I have reviewed the patients History and Physical, chart, labs and discussed the procedure including the risks, benefits and alternatives for the proposed anesthesia with the patient or authorized representative who has indicated his/her understanding and acceptance.   Dental advisory given  Plan Discussed with: CRNA  Anesthesia Plan Comments:         Anesthesia Quick Evaluation

## 2017-11-21 NOTE — Transfer of Care (Signed)
Immediate Anesthesia Transfer of Care Note  Patient: Erica Lynch  Procedure(s) Performed: ESOPHAGOGASTRODUODENOSCOPY (EGD) WITH PROPOFOL (N/A ) MALONEY DILATION (N/A )  Patient Location: PACU  Anesthesia Type:MAC  Level of Consciousness: awake, alert  and patient cooperative  Airway & Oxygen Therapy: Patient Spontanous Breathing  Post-op Assessment: Report given to RN and Post -op Vital signs reviewed and stable  Post vital signs: Reviewed and stable  Last Vitals:  Vitals:   11/21/17 1245 11/21/17 1300  BP: 120/79 118/79  Resp: 13 12  Temp:    SpO2: 97% 96%    Last Pain:  Vitals:   11/21/17 1100  TempSrc: Oral      Patients Stated Pain Goal: 8 (42/68/34 1962)  Complications: No apparent anesthesia complications

## 2017-11-21 NOTE — Interval H&P Note (Signed)
History and Physical Interval Note:  11/21/2017 12:52 PM  Erica Lynch  has presented today for surgery, with the diagnosis of GERD  The various methods of treatment have been discussed with the patient and family. After consideration of risks, benefits and other options for treatment, the patient has consented to  Procedure(s) with comments: ESOPHAGOGASTRODUODENOSCOPY (EGD) WITH PROPOFOL (N/A) - 12:00pm MALONEY DILATION (N/A) as a surgical intervention .  The patient's history has been reviewed, patient examined, no change in status, stable for surgery.  I have reviewed the patient's chart and labs.  Questions were answered to the patient's satisfaction.     Jearld Hemp   No change. EGD with possible esophageal dilation as feasible/appropriate per plan.  The risks, benefits, limitations, alternatives and imponderables have been reviewed with the patient. Potential for esophageal dilation, biopsy, etc. have also been reviewed.  Questions have been answered. All parties agreeable.

## 2017-11-21 NOTE — Telephone Encounter (Signed)
Short Stay called to make follow up OV with [place] and also said that [facility]MR was sending her by the office to get samples of Dexilant.

## 2017-11-21 NOTE — Discharge Instructions (Signed)
EGD Discharge instructions Please read the instructions outlined below and refer to this sheet in the next few weeks. These discharge instructions provide you with general information on caring for yourself after you leave the hospital. Your doctor may also give you specific instructions. While your treatment has been planned according to the most current medical practices available, unavoidable complications occasionally occur. If you have any problems or questions after discharge, please call your doctor. ACTIVITY  You may resume your regular activity but move at a slower pace for the next 24 hours.   Take frequent rest periods for the next 24 hours.   Walking will help expel (get rid of) the air and reduce the bloated feeling in your abdomen.   No driving for 24 hours (because of the anesthesia (medicine) used during the test).   You may shower.   Do not sign any important legal documents or operate any machinery for 24 hours (because of the anesthesia used during the test).  NUTRITION  Drink plenty of fluids.   You may resume your normal diet.   Begin with a light meal and progress to your normal diet.   Avoid alcoholic beverages for 24 hours or as instructed by your caregiver.  MEDICATIONS  You may resume your normal medications unless your caregiver tells you otherwise.  WHAT YOU CAN EXPECT TODAY  You may experience abdominal discomfort such as a feeling of fullness or gas pains.  FOLLOW-UP  Your doctor will discuss the results of your test with you.  SEEK IMMEDIATE MEDICAL ATTENTION IF ANY OF THE FOLLOWING OCCUR:  Excessive nausea (feeling sick to your stomach) and/or vomiting.   Severe abdominal pain and distention (swelling).   Trouble swallowing.   Temperature over 101 F (37.8 C).   Rectal bleeding or vomiting of blood.   Stop pantoprazole/Protonix; begin Dexilant 60 mg daily. Prescription provided. Before prescription filled, go by my office for free  samples.  Office visit with [place] in 6 weeks  In 3 weeks,  call and let me know how the Dexilant is working.     PATIENT INSTRUCTIONS POST-ANESTHESIA  IMMEDIATELY FOLLOWING SURGERY:  Do not drive or operate machinery for the first twenty four hours after surgery.  Do not make any important decisions for twenty four hours after surgery or while taking narcotic pain medications or sedatives.  If you develop intractable nausea and vomiting or a severe headache please notify your doctor immediately.  FOLLOW-UP:  Please make an appointment with your surgeon as instructed. You do not need to follow up with anesthesia unless specifically instructed to do so.  WOUND CARE INSTRUCTIONS (if applicable):  Keep a dry clean dressing on the anesthesia/puncture wound site if there is drainage.  Once the wound has quit draining you may leave it open to air.  Generally you should leave the bandage intact for twenty four hours unless there is drainage.  If the epidural site drains for more than 36-48 hours please call the anesthesia department.  QUESTIONS?:  Please feel free to call your physician or the hospital operator if you have any questions, and they will be happy to assist you.       Esophageal Dilatation Esophageal dilatation is a procedure to open a blocked or narrowed part of the esophagus. The esophagus is the long tube in your throat that carries food and liquid from your mouth to your stomach. The procedure is also called esophageal dilation. You may need this procedure if you have a buildup  of scar tissue in your esophagus that makes it difficult, painful, or even impossible to swallow. This can be caused by gastroesophageal reflux disease (GERD). In rare cases, people need this procedure because they have cancer of the esophagus or a problem with the way food moves through the esophagus. Sometimes you may need to have another dilatation to enlarge the opening of the esophagus gradually. Tell a  health care provider about:  Any allergies you have.  All medicines you are taking, including vitamins, herbs, eye drops, creams, and over-the-counter medicines.  Any problems you or family members have had with anesthetic medicines.  Any blood disorders you have.  Any surgeries you have had.  Any medical conditions you have.  Any antibiotic medicines you are required to take before dental procedures. What are the risks? Generally, this is a safe procedure. However, problems can occur and include:  Bleeding from a tear in the lining of the esophagus.  A hole (perforation) in the esophagus.  What happens before the procedure?  Do not eat or drink anything after midnight on the night before the procedure or as directed by your health care provider.  Ask your health care provider about changing or stopping your regular medicines. This is especially important if you are taking diabetes medicines or blood thinners.  Plan to have someone take you home after the procedure. What happens during the procedure?  You will be given a medicine that makes you relaxed and sleepy (sedative).  A medicine may be sprayed or gargled to numb the back of the throat.  Your health care provider can use various instruments to do an esophageal dilatation. During the procedure, the instrument used will be placed in your mouth and passed down into your esophagus. Options include: ? Simple dilators. This instrument is carefully placed in the esophagus to stretch it. ? Guided wire bougies. In this method, a flexible tube (endoscope) is used to insert a wire into the esophagus. The dilator is passed over this wire to enlarge the esophagus. Then the wire is removed. ? Balloon dilators. An endoscope with a small balloon at the end is passed down into the esophagus. Inflating the balloon gently stretches the esophagus and opens it up. What happens after the procedure?  Your blood pressure, heart rate,  breathing rate, and blood oxygen level will be monitored often until the medicines you were given have worn off.  Your throat may feel slightly sore and will probably still feel numb. This will improve slowly over time.  You will not be allowed to eat or drink until the throat numbness has resolved.  If this is a same-day procedure, you may be allowed to go home once you have been able to drink, urinate, and sit on the edge of the bed without nausea or dizziness.  If this is a same-day procedure, you should have a friend or family member with you for the next 24 hours after the procedure. This information is not intended to replace advice given to you by your health care provider. Make sure you discuss any questions you have with your health care provider. Document Released: 11/04/2005 Document Revised: 02/19/2016 Document Reviewed: 01/23/2014 Elsevier Interactive Patient Education  2018 [facility], Care After Refer to this sheet in the next few weeks. These instructions provide you with information about caring for yourself after your procedure. Your health care provider may also give you more specific instructions. Your treatment has been planned according to current medical  practices, but problems sometimes occur. Call your health care provider if you have any problems or questions after your procedure. What can I expect after the procedure? After the procedure, it is common to have:  A sore throat.  Bloating.  Nausea.  Follow these instructions at home:  Follow instructions from your health care provider about what to eat or drink after your procedure.  Return to your normal activities as told by your health care provider. Ask your health care provider what activities are safe for you.  Take over-the-counter and prescription medicines only as told by your health care provider.  Do not drive for 24 hours if you received a sedative.  Keep all follow-up visits  as told by your health care provider. This is important. Contact a health care provider if:  You have a sore throat that lasts longer than one day.  You have trouble swallowing. Get help right away if:  You have a fever.  You vomit blood or your vomit looks like coffee grounds.  You have bloody, black, or tarry stools.  You have a severe sore throat or you cannot swallow.  You have difficulty breathing.  You have severe pain in your chest or belly. This information is not intended to replace advice given to you by your health care provider. Make sure you discuss any questions you have with your health care provider. Document Released: 03/14/2012 Document Revised: 02/19/2016 Document Reviewed: 06/26/2015 Elsevier Interactive Patient Education  [employer].

## 2017-11-21 NOTE — Anesthesia Procedure Notes (Signed)
Procedure Name: MAC Date/Time: 11/21/2017 1:09 PM Performed by: Vista Deck, CRNA Pre-anesthesia Checklist: Patient identified, Emergency Drugs available, Suction available, Timeout performed and Patient being monitored Patient Re-evaluated:Patient Re-evaluated prior to induction Oxygen Delivery Method: Non-rebreather mask

## 2017-11-22 ENCOUNTER — Encounter (HOSPITAL_COMMUNITY): Payer: Self-pay | Admitting: Internal Medicine

## 2017-11-23 DIAGNOSIS — K120 Recurrent oral aphthae: Secondary | ICD-10-CM | POA: Diagnosis not present

## 2017-11-23 DIAGNOSIS — K219 Gastro-esophageal reflux disease without esophagitis: Secondary | ICD-10-CM | POA: Diagnosis not present

## 2017-11-24 NOTE — Telephone Encounter (Signed)
Dexilant 60mg  capsules are ready for pickup. Pt notified

## 2017-11-25 ENCOUNTER — Ambulatory Visit (HOSPITAL_COMMUNITY): Payer: Self-pay | Admitting: Psychiatry

## 2017-11-25 DIAGNOSIS — G43B0 Ophthalmoplegic migraine, not intractable: Secondary | ICD-10-CM | POA: Diagnosis not present

## 2017-12-07 ENCOUNTER — Encounter (HOSPITAL_COMMUNITY): Payer: Self-pay | Admitting: Psychiatry

## 2017-12-07 ENCOUNTER — Ambulatory Visit (HOSPITAL_COMMUNITY): Payer: BLUE CROSS/BLUE SHIELD | Admitting: Psychiatry

## 2017-12-07 DIAGNOSIS — F331 Major depressive disorder, recurrent, moderate: Secondary | ICD-10-CM | POA: Diagnosis not present

## 2017-12-07 NOTE — Progress Notes (Addendum)
THERAPIST PROGRESS NOTE             Session Time:  Wednesday 12/07/2017 8:08 AM - 9:05 AM                       Participation Level: Active         Behavioral Response: Well GroomedAlert/                             Type of Therapy: Individual Therapy  Treatment Goals addressed:   1. Learn and implement conflict resolution skills to resolve interpersonal problems       2. Identify and replace thoughts and beliefs that support depression and anxiety             Interventions: CBT   Summary: Erica Lynch is a 57 y.o. female who is referred for services by PCP Dr. Willey Blade due to patient experiencing symptoms of depression and anxiety. She is a returning patient to this clinician and last was seen in 2014. She initially was seen in this practice in 2012.  She states her emotions are out of control and  reports feeling angry, anxious, unhappy, and crying a lot. She says she feels as if she will never be happy again. She also  states she doesn't want to be around people. Her main stressor is her marriage. She and her husband began to experience increased problems in their marriage when son left for college 2 years ago. They have little in common now and often disagree about money and caring for thier possessions. Patient also reports discord related to her lack of interest in sex since she has been menopausal. She states being tired of picking up after her husband and adult son. She reports additional stress related to not settling deceased  father's estate , conflict with her middle sister, and issues at work.  Patient reports being sick with strep throat and the flu since last session and feeling a little down. However, she reports using healthy coping skills. She reports increased awareness of thoughts and feelings as well as behaviors. She cites an example of using assertiveness skills in a recent incident about trying to meet the payroll at her job. She reports initially noticing  becoming very anxious, having judgmentall critical thoughts about self ( I am a failure, I am inadequate), a sense of exaggerated responsibility, and feelings of guilt. She was able to identify, challenge, and replace negative thoughts with healthy realistic alternatives. 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 She is very pleased with her actions.    Suicidal/Homicidal: None   Therapist Response: Reviewed symptoms, administered PH Q-9 and GAD-7,  discussed recent incident at job and patient's actions and responses, discussed effects on mood/behavior/thoughts praise and reinforced patient's increased awareness and recognition of her thoughts/mood/and behaviors and her efforts to replace with healthy alternatives, reviewed goals and patient's progress, discussed stepdown plan for termination in 3 or 4 sessions, introduced the concept of mindfulness and using mindfulness practice as a way to help regulate emotions, discussed the window of tolerance and identified grounding skills to use when patient has gone outside her window of tolerance, introduced exercises to practice to increase mindfulness including using breath awareness and eating mindfully, assigned patient to practice a mindfulness exercise daily between sessions  Plan: Return again in 2 weeks.  Diagnosis: Axis I: Major depressive disorder, recurrent, moderate    Axis II: Deferred    Anber Mckiver, LCSW 12/07/2017

## 2017-12-22 ENCOUNTER — Other Ambulatory Visit: Payer: Self-pay | Admitting: Adult Health

## 2017-12-26 ENCOUNTER — Other Ambulatory Visit: Payer: Self-pay

## 2017-12-26 ENCOUNTER — Encounter: Payer: Self-pay | Admitting: Adult Health

## 2017-12-26 ENCOUNTER — Ambulatory Visit (INDEPENDENT_AMBULATORY_CARE_PROVIDER_SITE_OTHER): Payer: BLUE CROSS/BLUE SHIELD | Admitting: Adult Health

## 2017-12-26 ENCOUNTER — Ambulatory Visit (HOSPITAL_COMMUNITY): Payer: BLUE CROSS/BLUE SHIELD | Admitting: Psychiatry

## 2017-12-26 ENCOUNTER — Encounter (HOSPITAL_COMMUNITY): Payer: Self-pay | Admitting: Psychiatry

## 2017-12-26 VITALS — BP 124/84 | HR 92 | Resp 18 | Ht 64.0 in | Wt 193.5 lb

## 2017-12-26 DIAGNOSIS — Z01411 Encounter for gynecological examination (general) (routine) with abnormal findings: Secondary | ICD-10-CM | POA: Diagnosis not present

## 2017-12-26 DIAGNOSIS — Z01419 Encounter for gynecological examination (general) (routine) without abnormal findings: Secondary | ICD-10-CM

## 2017-12-26 DIAGNOSIS — Z79890 Hormone replacement therapy (postmenopausal): Secondary | ICD-10-CM

## 2017-12-26 DIAGNOSIS — Z1211 Encounter for screening for malignant neoplasm of colon: Secondary | ICD-10-CM

## 2017-12-26 DIAGNOSIS — F331 Major depressive disorder, recurrent, moderate: Secondary | ICD-10-CM

## 2017-12-26 DIAGNOSIS — Z1212 Encounter for screening for malignant neoplasm of rectum: Secondary | ICD-10-CM | POA: Diagnosis not present

## 2017-12-26 LAB — HEMOCCULT GUIAC POC 1CARD (OFFICE): FECAL OCCULT BLD: NEGATIVE

## 2017-12-26 MED ORDER — ESTRADIOL 0.1 MG/GM VA CREA: TOPICAL_CREAM | VAGINAL | 3 refills | Status: DC

## 2017-12-26 NOTE — Progress Notes (Signed)
Patient ID: Erica Lynch, female   DOB: 27-Dec-1960, 57 y.o.   MRN: [id number] History of Present Illness: [name] is a 57 year old white female, married, PM in for a well woman gyn exam,she had a normal pap with negative HPV 12/21/16.She is stressed at work.Son still at home. Her sisters had BRCA testing and were negative, but one sister is getting her mammograms more frequently.  PCP is Dr Willey Blade.   Current Medications, Allergies, Past Medical History, Past Surgical History, Family History and Social History were reviewed in [employer] record.     Review of Systems: Patient denies any headaches, hearing loss, fatigue, blurred vision, shortness of breath, chest pain, abdominal pain, problems with bowel movements, urination, or intercourse(not having sex). No joint pain or mood swings.    Physical Exam:BP 124/84 (BP Location: Right Arm, Patient Position: Sitting, Cuff Size: Normal)   Pulse 92   Resp 18   Ht 5' 4"  (1.626 m)   Wt 193 lb 8 oz (87.8 kg)   BMI 33.21 kg/m  General:  Well developed, well nourished, no acute distress Skin:  Warm and dry Neck:  Midline trachea, normal thyroid, good ROM, no lymphadenopathy Lungs; Clear to auscultation bilaterally Breast:  No dominant palpable mass, retraction, or nipple discharge Cardiovascular: Regular rate and rhythm Abdomen:  Soft, non tender, no hepatosplenomegaly Pelvic:  External genitalia is normal in appearance, no lesions.  The vagina is normal in appearance. Urethra has no lesions or masses. The cervix is bulbous.  Uterus is felt to be normal size, shape, and contour.  No adnexal masses or tenderness noted.Bladder is non tender, no masses felt. Rectal: Good sphincter tone, no polyps, or hemorrhoids felt.  Hemoccult negative. Extremities/musculoskeletal:  No swelling or varicosities noted, no clubbing or cyanosis Psych:  No mood changes, alert and cooperative,seems happy PHQ 2 score 0.   Impression: 1. Encounter  for gynecological examination   2. Screening for colorectal cancer   3. Hormone replacement therapy (HRT)       Plan: Can start weaning off HRT but continue PVC Meds ordered this encounter  Medications  . estradiol (ESTRACE VAGINAL) 0.1 MG/GM vaginal cream    Sig: Use 1 gm in vagina 2 x weekly    Dispense:  42.5 g    Refill:  3    Order Specific Question:   Supervising Provider    Answer:   EURE, [place] with PCP Mammogram yearly Colonoscopy per GI Physical in 1 year Pap in 2021

## 2017-12-26 NOTE — Progress Notes (Signed)
THERAPIST PROGRESS NOTE             Session Time:  Monday 12/26/2017 10:08 AM -  10:55 AM              Participation Level: Active         Behavioral Response: Well GroomedAlert/                             Type of Therapy: Individual Therapy  Treatment Goals addressed:   1. Learn and implement conflict resolution skills to resolve interpersonal problems       2. Identify and replace thoughts and beliefs that support depression and anxiety             Interventions: CBT   Summary: JADORE MCGUFFIN is a 57 y.o. female who is referred for services by PCP Dr. Willey Blade due to patient experiencing symptoms of depression and anxiety. She is a returning patient to this clinician and last was seen in 2014. She initially was seen in this practice in 2012.  She states her emotions are out of control and  reports feeling angry, anxious, unhappy, and crying a lot. She says she feels as if she will never be happy again. She also  states she doesn't want to be around people. Her main stressor is her marriage. She and her husband began to experience increased problems in their marriage when son left for college 2 years ago. They have little in common now and often disagree about money and caring for thier possessions. Patient also reports discord related to her lack of interest in sex since she has been menopausal. She states being tired of picking up after her husband and adult son. She reports additional stress related to not settling deceased  father's estate , conflict with her middle sister, and issues at work.  Patient reports doing well since last session. She has continued positive self-care and has resumed walking regimen on the treadmill. She is excited she applied for another job and is hopeful she will have an interview. She reports dreading going to her current job but says it's tolerable. She no longer expresses anger and resentment regarding job and is able to set/maintain boundaries. She  reports taking breaks and having realistic expectations of self.  She reports learning to become more mindful and has tried exercise of eating mindfully. She reports also trying to be more mindful in observing the weather. She has been working in her yard.   Suicidal/Homicidal: None   Therapist Response: Reviewed symptoms, praised and reinforced patient's improved self care, discussed patient's thoughts and feelings about recently applying for a job, discussed the connection between thoughts, feelings, and behaviors regarding her process, praised patient's efforts to practice mindfulness between sessions, reviewed the rationale for practicing mindfulness activities daily, discussed the benefits of mindfulness on the brain and provided patient with a handout to review, practiced mindfulness activity using breath awareness, provided patient with handout on using breath awareness as mindfulness technique, assigned patient to practice mindfulness technique daily between sessions  Plan: Return again in 2 weeks.  Diagnosis: Axis I: Major depressive disorder, recurrent, moderate    Axis II: Deferred    Labrea Eccleston, LCSW 12/26/2017

## 2018-01-02 ENCOUNTER — Telehealth: Payer: Self-pay | Admitting: Orthopedic Surgery

## 2018-01-02 NOTE — Telephone Encounter (Signed)
Erica Lynch called and stated that her right elbow was hurting and wanted an appointment with Dr. Aline Brochure.  Unfortunately we didn't have anything until May 1st.  She said it was hurting but that she had no injured it so she didn't think it was broken.  Appointment given for Wednesday, 01/25/18 at 3:50

## 2018-01-11 ENCOUNTER — Encounter: Payer: Self-pay | Admitting: Gastroenterology

## 2018-01-11 ENCOUNTER — Ambulatory Visit (INDEPENDENT_AMBULATORY_CARE_PROVIDER_SITE_OTHER): Payer: BLUE CROSS/BLUE SHIELD | Admitting: Gastroenterology

## 2018-01-11 VITALS — BP 109/81 | HR 81 | Temp 97.3°F | Ht 64.0 in | Wt 194.0 lb

## 2018-01-11 DIAGNOSIS — K219 Gastro-esophageal reflux disease without esophagitis: Secondary | ICD-10-CM

## 2018-01-11 NOTE — Progress Notes (Signed)
      Primary Care Physician: Asencion Noble, MD  Primary Gastroenterologist:  Garfield Cornea, MD   Chief Complaint  Patient presents with  . Gastroesophageal Reflux    pp f/u. Doing well since changed to dexilant    HPI: Erica Lynch is a 57 y.o. female here for follow-up of refractory reflux.  She had a EGD back in February.  She has small hiatal hernia, normal-appearing esophagus which was dilated for history of dysphagia.  Doing well now on Dexilant 60 mg daily.  Rare breakthrough heartburn which responds well to Mylanta.  Generally does very well.  She wonders when or if she can come off the medication at some point in the near future.  She denies any dysphagia.  No abdominal pain.  Bowel function is good.  No melena or rectal bleeding.  Her next colonoscopy is due in December 2022.    Current Outpatient Medications  Medication Sig Dispense Refill  . buPROPion (WELLBUTRIN XL) 150 MG 24 hr tablet Take 1 tablet (150 mg total) by mouth daily. 30 tablet 5  . clonazePAM (KLONOPIN) 1 MG tablet Take 1 tablet (1 mg total) by mouth 2 (two) times daily as needed for anxiety. (Patient taking differently: Take 2 mg by mouth at bedtime. ) 60 tablet 5  . DEXILANT 60 MG capsule Take 60 mg by mouth daily.   4  . estradiol (ESTRACE VAGINAL) 0.1 MG/GM vaginal cream Use 1 gm in vagina 2 x weekly 42.5 g 3  . estrogens, conjugated, (PREMARIN) 0.625 MG tablet Take 1 tablet (0.625 mg total) by mouth daily. 30 tablet 12  . progesterone (PROMETRIUM) 200 MG capsule TAKE (1) CAPSULE BY MOUTH ONCE DAILY. 30 capsule 0   No current facility-administered medications for this visit.     Allergies as of 01/11/2018  . (No Known Allergies)    ROS:  General: Negative for anorexia, weight loss, fever, chills, fatigue, weakness. ENT: Negative for hoarseness, difficulty swallowing , nasal congestion. CV: Negative for chest pain, angina, palpitations, dyspnea on exertion, peripheral edema.  Respiratory: Negative  for dyspnea at rest, dyspnea on exertion, cough, sputum, wheezing.  GI: See history of present illness. GU:  Negative for dysuria, hematuria, urinary incontinence, urinary frequency, nocturnal urination.  Endo: Negative for unusual weight change.    Physical Examination:   BP 109/81   Pulse 81   Temp (!) 97.3 F (36.3 C) (Oral)   Ht 5\' 4"  (1.626 m)   Wt 194 lb (88 kg)   BMI 33.30 kg/m   General: Well-nourished, well-developed in no acute distress.  Eyes: No icterus. Neuro: Alert and oriented x 4   Skin: Warm and dry, no jaundice.   Psych: Alert and cooperative, normal mood and affect.

## 2018-01-11 NOTE — Assessment & Plan Note (Addendum)
Doing very well on Dexilant 60 mg daily.  Would recommend taking at least 3 months prior to consideration of coming off medication.  She can try taking every other day and eventually wean off as long as it is tolerated.  If she has recurrent symptoms more than a few days per week, would recommend long-term PPI for now.  She will come back in 2 years for follow-up or call sooner if needed.  Her next colonoscopy is due in Dec 2022.

## 2018-01-11 NOTE — Patient Instructions (Signed)
1. Continue Dexilant 60mg  daily for at least three months prior to trying to wean off the medication. If you need to continue long term for control of your symptoms it's okay to do so.  2. We will see you back in two years or sooner if needed.

## 2018-01-12 NOTE — Progress Notes (Signed)
CC'D TO PCP °

## 2018-01-16 ENCOUNTER — Ambulatory Visit (HOSPITAL_COMMUNITY): Payer: Self-pay | Admitting: Psychiatry

## 2018-01-25 ENCOUNTER — Ambulatory Visit (INDEPENDENT_AMBULATORY_CARE_PROVIDER_SITE_OTHER): Payer: BLUE CROSS/BLUE SHIELD | Admitting: Orthopedic Surgery

## 2018-01-25 ENCOUNTER — Encounter: Payer: Self-pay | Admitting: Advanced Practice Midwife

## 2018-01-25 ENCOUNTER — Encounter: Payer: Self-pay | Admitting: Orthopedic Surgery

## 2018-01-25 ENCOUNTER — Ambulatory Visit (INDEPENDENT_AMBULATORY_CARE_PROVIDER_SITE_OTHER): Payer: BLUE CROSS/BLUE SHIELD

## 2018-01-25 ENCOUNTER — Ambulatory Visit: Payer: BLUE CROSS/BLUE SHIELD | Admitting: Advanced Practice Midwife

## 2018-01-25 VITALS — BP 125/89 | HR 84 | Ht 64.0 in | Wt 185.0 lb

## 2018-01-25 DIAGNOSIS — M7711 Lateral epicondylitis, right elbow: Secondary | ICD-10-CM | POA: Diagnosis not present

## 2018-01-25 DIAGNOSIS — R350 Frequency of micturition: Secondary | ICD-10-CM | POA: Diagnosis not present

## 2018-01-25 DIAGNOSIS — G5601 Carpal tunnel syndrome, right upper limb: Secondary | ICD-10-CM

## 2018-01-25 DIAGNOSIS — M25521 Pain in right elbow: Secondary | ICD-10-CM

## 2018-01-25 LAB — POCT URINALYSIS DIPSTICK
Blood, UA: NEGATIVE
GLUCOSE UA: NEGATIVE
KETONES UA: NEGATIVE
Leukocytes, UA: NEGATIVE
Nitrite, UA: NEGATIVE
Protein, UA: NEGATIVE

## 2018-01-25 MED ORDER — PHENAZOPYRIDINE HCL 200 MG PO TABS: 200.0000 mg | ORAL_TABLET | Freq: Three times a day (TID) | ORAL | 0 refills | Status: DC | PRN

## 2018-01-25 MED ORDER — SULFAMETHOXAZOLE-TRIMETHOPRIM 800-160 MG PO TABS: 1.0000 | ORAL_TABLET | Freq: Two times a day (BID) | ORAL | 0 refills | Status: DC

## 2018-01-25 NOTE — Progress Notes (Signed)
Progress Note   Patient ID: Erica Lynch, female   DOB: 09-26-1961, 57 y.o.   MRN: [id number] Chief Complaint  Patient presents with  . Elbow Pain    has pain Right elbow since January ? injury fell going up steps during power outage has pain down arm into fingers 4th/ 5th      Medical decision-making  Imaging:  Xrays ordered: y/n ? y X-ray right elbow normal see my report  Encounter Diagnoses  Name Primary?  . Elbow pain, right Yes  . Lateral epicondylitis of right elbow   . Carpal tunnel syndrome of right wrist       No orders of the defined types were placed in this encounter.   PLAN:  Forearm splint to control the wrist and help control the right tennis elbow and can also help support the wrist for the carpal tunnel syndrome  OTC NSAID S  Tennis elbow exercises  Arther Abbott, MD 01/25/2018 4:00 PM     Chief Complaint  Patient presents with  . Elbow Pain    has pain Right elbow since January ? injury fell going up steps during power outage has pain down arm into fingers 4th/ 53th     57 years old presents for evaluation of painful elbow and hand  Patient relates fall back in January 2 ongoing symptoms in the right upper extremity which include pain around the elbow pain with power grip dull aching pain with any heavy activity that requires lifting this is associated with numbness and tingling of the index long and ring finger    Review of Systems  Musculoskeletal: Positive for joint pain.  Neurological: Positive for tingling and sensory change. Negative for tremors.  All other systems reviewed and are negative.  Current Meds  Medication Sig  . buPROPion (WELLBUTRIN XL) 150 MG 24 hr tablet Take 1 tablet (150 mg total) by mouth daily.  . clonazePAM (KLONOPIN) 1 MG tablet Take 1 tablet (1 mg total) by mouth 2 (two) times daily as needed for anxiety. (Patient taking differently: Take 2 mg by mouth at bedtime. )  . DEXILANT 60 MG capsule Take 60 mg by  mouth daily.   Marland Kitchen estradiol (ESTRACE VAGINAL) 0.1 MG/GM vaginal cream Use 1 gm in vagina 2 x weekly  . estrogens, conjugated, (PREMARIN) 0.625 MG tablet Take 1 tablet (0.625 mg total) by mouth daily.  . progesterone (PROMETRIUM) 200 MG capsule TAKE (1) CAPSULE BY MOUTH ONCE DAILY.    Past Medical History:  Diagnosis Date  . Anxiety   . Depression   . Fatigue 12/04/2015  . GERD (gastroesophageal reflux disease)   . Hematuria 05/22/2014  . History of breast lump 02/19/2014  . Hot flashes 12/04/2015  . Hyperlipidemia   . IBS (irritable bowel syndrome)   . Left breast mass 11/07/2013  . LLQ pain 05/22/2014  . Lumbar herniated disc November 2016  . Other and unspecified ovarian cyst 05/23/2014  . Unspecified symptom associated with female genital organs 05/22/2014  . Vitamin D deficiency 12/05/2015     No Known Allergies  BP 125/89   Pulse 84   Ht 5\' 4"  (1.626 m)   Wt 185 lb (83.9 kg)   BMI 31.76 kg/m    Physical Exam  Constitutional: She is oriented to person, place, and time. She appears well-developed and well-nourished.  Neurological: She is alert and oriented to person, place, and time.  Psychiatric: She has a normal mood and affect. Judgment normal.  Vitals reviewed.  Ortho Exam Left elbow tenderness over the lateral epicondyle normal range of motion collateral ligaments stable strength normal skin intact pulses good sensation normal  Provocative test for carpal tunnel Tinel's negative Phalen's positive carpal tunnel compression test normal  Opposite elbow normal range of motion stability strength alignment.  Provocative test for tennis elbow wrist extension against resistance with elbow extended positive.

## 2018-01-25 NOTE — Patient Instructions (Signed)
30-day restriction no lifting with right arm   Tennis elbow exercises for 6 weeks  Brace 6 weeks, Wear to bed  Use over-the-counter medicine such as Tylenol or Advil or Aleve for pain take as needed  Carpal Tunnel Syndrome Carpal tunnel syndrome is a condition that causes pain in your hand and arm. The carpal tunnel is a narrow area located on the palm side of your wrist. Repeated wrist motion or certain diseases may cause swelling within the tunnel. This swelling pinches the main nerve in the wrist (median nerve). What are the causes? This condition may be caused by:  Repeated wrist motions.  Wrist injuries.  Arthritis.  A cyst or tumor in the carpal tunnel.  Fluid buildup during pregnancy.  Sometimes the cause of this condition is not known. What increases the risk? This condition is more likely to develop in:  People who have jobs that cause them to repeatedly move their wrists in the same motion, such as Art gallery manager.  Women.  People with certain conditions, such as: ? Diabetes. ? Obesity. ? An underactive thyroid (hypothyroidism). ? Kidney failure.  What are the signs or symptoms? Symptoms of this condition include:  A tingling feeling in your fingers, especially in your thumb, index, and middle fingers.  Tingling or numbness in your hand.  An aching feeling in your entire arm, especially when your wrist and elbow are bent for long periods of time.  Wrist pain that goes up your arm to your shoulder.  Pain that goes down into your palm or fingers.  A weak feeling in your hands. You may have trouble grabbing and holding items.  Your symptoms may feel worse during the night. How is this diagnosed? This condition is diagnosed with a medical history and physical exam. You may also have tests, including:  An electromyogram (EMG). This test measures electrical signals sent by your nerves into the muscles.  X-rays.  How is this treated? Treatment for  this condition includes:  Lifestyle changes. It is important to stop doing or modify the activity that caused your condition.  Physical or occupational therapy.  Medicines for pain and inflammation. This may include medicine that is injected into your wrist.  A wrist splint.  Surgery.  Follow these instructions at home: If you have a splint:  Wear it as told by your health care provider. Remove it only as told by your health care provider.  Loosen the splint if your fingers become numb and tingle, or if they turn cold and blue.  Keep the splint clean and dry. General instructions  Take over-the-counter and prescription medicines only as told by your health care provider.  Rest your wrist from any activity that may be causing your pain. If your condition is work related, talk to your employer about changes that can be made, such as getting a wrist pad to use while typing.  If directed, apply ice to the painful area: ? Put ice in a plastic bag. ? Place a towel between your skin and the bag. ? Leave the ice on for 20 minutes, 2-3 times per day.  Keep all follow-up visits as told by your health care provider. This is important.  Do any exercises as told by your health care provider, physical therapist, or occupational therapist. Contact a health care provider if:  You have new symptoms.  Your pain is not controlled with medicines.  Your symptoms get worse. This information is not intended to replace advice given to  you by your health care provider. Make sure you discuss any questions you have with your health care provider. Document Released: 09/10/2000 Document Revised: 01/22/2016 Document Reviewed: 01/29/2015 Elsevier Interactive Patient Education  2018 [place] Tennis elbow (lateral epicondylitis) is inflammation of the outer tendons of your forearm close to your elbow. Your tendons attach your muscles to your bones. The outer tendons of your forearm  are used to extend your wrist, and they attach on the outside part of your elbow. Tennis elbow is often found in people who play tennis, but anyone may get the condition from repeatedly extending the wrist or turning the forearm. What are the causes? This condition is caused by repeatedly extending your wrist and using your hands. It can result from sports or work that requires repetitive forearm movements. Tennis elbow may also be caused by an injury. What increases the risk? You have a higher risk of developing tennis elbow if you play tennis or another racquet sport. You also have a higher risk if you frequently use your hands for work. This condition is also more likely to develop in:  Musicians.  Carpenters, painters, and plumbers.  Cooks.  Cashiers.  People who work in [employer].  Architect workers.  Butchers.  People who use computers.  What are the signs or symptoms? Symptoms of this condition include:  Pain and tenderness in your forearm and the outer part of your elbow. You may only feel the pain when you use your arm, or you may feel it even when you are not using your arm.  A burning feeling that runs from your elbow through your arm.  Weak grip in your hands.  How is this diagnosed? This condition may be diagnosed by medical history and physical exam. You may also have other tests, including:  X-rays.  MRI.  How is this treated? Your health care provider will recommend lifestyle adjustments, such as resting and icing your arm. Treatment may also include:  Medicines for inflammation. This may include shots of cortisone if your pain continues.  Physical therapy. This may include massage or exercises.  An elbow brace.  Surgery may eventually be recommended if your pain does not go away with treatment. Follow these instructions at home: Activity  Rest your elbow and wrist as directed by your health care provider. Try to avoid any activities that caused  the problem until your health care provider says that you can do them again.  If a physical therapist teaches you exercises, do all of them as directed.  If you lift an object, lift it with your palm facing upward. This lowers the stress on your elbow. Lifestyle  If your tennis elbow is caused by sports, check your equipment and make sure that: ? You are using it correctly. ? It is the best fit for you.  If your tennis elbow is caused by work, take breaks frequently, if you are able. Talk with your manager about how to best perform tasks in a way that is safe. ? If your tennis elbow is caused by computer use, talk with your manager about any changes that can be made to your work environment. General instructions  If directed, apply ice to the painful area: ? Put ice in a plastic bag. ? Place a towel between your skin and the bag. ? Leave the ice on for 20 minutes, 2-3 times per day.  Take medicines only as directed by your health care provider.  If you were  given a brace, wear it as directed by your health care provider.  Keep all follow-up visits as directed by your health care provider. This is important. Contact a health care provider if:  Your pain does not get better with treatment.  Your pain gets worse.  You have numbness or weakness in your forearm, hand, or fingers. This information is not intended to replace advice given to you by your health care provider. Make sure you discuss any questions you have with your health care provider. Document Released: 09/13/2005 Document Revised: 05/13/2016 Document Reviewed: 09/09/2014 Elsevier Interactive Patient Education  [employer].

## 2018-01-25 NOTE — Addendum Note (Signed)
Addended by: Diona Fanti A on: 01/25/2018 04:39 PM   Modules accepted: Orders

## 2018-01-25 NOTE — Progress Notes (Signed)
[facility] Visit  Patient name: [name] MRN [id number]  Date of birth: [birth year]  CC & HPI:  AMIAYAH GIEBEL is a 57 y.o. Caucasian female presenting today for c/o suprapubic pressure/frequecy/dysuria. Woke up yesterday feeling like she had to urinate, pain didn't go away afterwards. Now constantly feels lke she has to pee and has pain w/voiding.   Pertinent History Reviewed:  Medical & Surgical Hx:   Past Medical History:  Diagnosis Date  . Anxiety   . Depression   . Fatigue 12/04/2015  . GERD (gastroesophageal reflux disease)   . Hematuria 05/22/2014  . History of breast lump 02/19/2014  . Hot flashes 12/04/2015  . Hyperlipidemia   . IBS (irritable bowel syndrome)   . Left breast mass 11/07/2013  . LLQ pain 05/22/2014  . Lumbar herniated disc November 2016  . Other and unspecified ovarian cyst 05/23/2014  . Unspecified symptom associated with female genital organs 05/22/2014  . Vitamin D deficiency 12/05/2015   Past Surgical History:  Procedure Laterality Date  . CESAREAN SECTION  1996  . COLONOSCOPY  08/2011   Dr. Gala Romney: normal, next colonoscopy December 2022.  . ENDOMETRIAL ABLATION  2-3 yrs ago   [facility]  . ESOPHAGOGASTRODUODENOSCOPY (EGD) WITH PROPOFOL N/A 11/21/2017   Dr. Gala Romney: Normal esophagus, status post dilation, small hiatal hernia  . MALONEY DILATION N/A 11/21/2017   Procedure: Venia Minks DILATION;  Surgeon: Daneil Dolin, MD;  Location: [facility];  Service: Endoscopy;  Laterality: N/A;  . OTHER SURGICAL HISTORY     Uterine ablation  . TUBAL LIGATION  6 yrs ago   [facility]   Family History  Problem Relation Age of Onset  . Lung cancer Mother        deceased from lung cancer at age 18  . Breast cancer Mother   . Cancer Mother        breast at age 6  . Depression Mother   . Cirrhosis Father        etoh   . Anxiety disorder Father   . Alcohol abuse Father   . Depression Father   . Diabetes Maternal Grandfather   . Heart disease  Maternal Grandfather   . Depression Maternal Grandmother   . Depression Maternal Uncle   . Depression Maternal Uncle   . Anesthesia problems Neg Hx   . Hypotension Neg Hx   . Malignant hyperthermia Neg Hx   . Pseudochol deficiency Neg Hx   . Colon cancer Neg Hx     Current Outpatient Medications:  .  buPROPion (WELLBUTRIN XL) 150 MG 24 hr tablet, Take 1 tablet (150 mg total) by mouth daily., Disp: 30 tablet, Rfl: 5 .  clonazePAM (KLONOPIN) 1 MG tablet, Take 1 tablet (1 mg total) by mouth 2 (two) times daily as needed for anxiety. (Patient taking differently: Take 2 mg by mouth at bedtime. ), Disp: 60 tablet, Rfl: 5 .  DEXILANT 60 MG capsule, Take 60 mg by mouth daily. , Disp: , Rfl: 4 .  estradiol (ESTRACE VAGINAL) 0.1 MG/GM vaginal cream, Use 1 gm in vagina 2 x weekly, Disp: 42.5 g, Rfl: 3 .  estrogens, conjugated, (PREMARIN) 0.625 MG tablet, Take 1 tablet (0.625 mg total) by mouth daily., Disp: 30 tablet, Rfl: 12 .  progesterone (PROMETRIUM) 200 MG capsule, TAKE (1) CAPSULE BY MOUTH ONCE DAILY., Disp: 30 capsule, Rfl: 0 .  phenazopyridine (PYRIDIUM) 200 MG tablet, Take 1 tablet (200 mg total) by mouth 3 (three) times daily  as needed for pain., Disp: 10 tablet, Rfl: 0 .  sulfamethoxazole-trimethoprim (BACTRIM DS,SEPTRA DS) 800-160 MG tablet, Take 1 tablet by mouth 2 (two) times daily., Disp: 10 tablet, Rfl: 0 Social History: Reviewed -  reports that she has never smoked. She has never used smokeless tobacco.  Review of Systems:   Constitutional: Negative for fever and chills Eyes: Negative for visual disturbances Respiratory: Negative for shortness of breath, dyspnea Cardiovascular: Negative for chest pain or palpitations  Gastrointestinal: Negative for vomiting, diarrhea and constipation; no abdominal pain Genitourinary: Negative for vaginal irritation or itching Musculoskeletal: Negative for back pain, joint pain, myalgias  Neurological: Negative for dizziness and  headaches    Objective Findings:    Physical Examination: Vitals:   01/25/18 1157  BP: 110/70  Pulse: 96  Temp: 98 F (36.7 C)   General appearance - well appearing, and in no distress Mental status - alert, oriented to person, place, and time Chest:  Normal respiratory effort Heart - normal rate and regular rhythm Abdomen:  Soft, nontender Pelvic: deferred Musculoskeletal:  Normal range of motion without pain Extremities:  No edema    Results for orders placed or performed in visit on 01/25/18 (from the past 24 hour(s))  POCT urinalysis dipstick   Collection Time: 01/25/18 12:06 PM  Result Value Ref Range   Color, UA     Clarity, UA     Glucose, UA neg    Bilirubin, UA     Ketones, UA neg    Spec Grav, UA  1.010 - 1.025   Blood, UA neg    pH, UA  5.0 - 8.0   Protein, UA neg    Urobilinogen, UA  0.2 or 1.0 E.U./dL   Nitrite, UA neg    Leukocytes, UA Negative Negative   Appearance     Odor        Assessment & Plan:  A:   UTI vs bladder spasms P:   Meds ordered this encounter  Medications  . phenazopyridine (PYRIDIUM) 200 MG tablet    Sig: Take 1 tablet (200 mg total) by mouth 3 (three) times daily as needed for pain.    Dispense:  10 tablet    Refill:  0    Order Specific Question:   Supervising Provider    Answer:   Elonda Husky, LUTHER H [[id number]]  . sulfamethoxazole-trimethoprim (BACTRIM DS,SEPTRA DS) 800-160 MG tablet    Sig: Take 1 tablet by mouth 2 (two) times daily.    Dispense:  10 tablet    Refill:  0    Order Specific Question:   Supervising Provider    Answer:   Tania Ade H [[id number]]    Culture urine. If culture neg, consider antispasmodic if sx persist  Return for If you have any problems.  Christin Fudge CNM 01/25/2018 1:55 PM

## 2018-01-27 LAB — URINE CULTURE

## 2018-02-06 ENCOUNTER — Ambulatory Visit (HOSPITAL_COMMUNITY): Payer: BLUE CROSS/BLUE SHIELD | Admitting: Psychiatry

## 2018-02-06 DIAGNOSIS — F331 Major depressive disorder, recurrent, moderate: Secondary | ICD-10-CM

## 2018-02-06 NOTE — Progress Notes (Signed)
THERAPIST PROGRESS NOTE             Session Time:  Monday 02/06/2018 8:10 AM -  9:00 AM               Participation Level: Active         Behavioral Response: Well GroomedAlert/                             Type of Therapy: Individual Therapy  Treatment Goals addressed:   1. Learn and implement conflict resolution skills to resolve interpersonal problems       2. Identify and replace thoughts and beliefs that support depression and anxiety             Interventions: CBT   Summary: SHAUNIECE KWAN is a 57 y.o. female who is referred for services by PCP Dr. Willey Blade due to patient experiencing symptoms of depression and anxiety. She is a returning patient to this clinician and last was seen in 2014. She initially was seen in this practice in 2012.  She states her emotions are out of control and  reports feeling angry, anxious, unhappy, and crying a lot. She says she feels as if she will never be happy again. She also  states she doesn't want to be around people. Her main stressor is her marriage. She and her husband began to experience increased problems in their marriage when son left for college 2 years ago. They have little in common now and often disagree about money and caring for thier possessions. Patient also reports discord related to her lack of interest in sex since she has been menopausal. She states being tired of picking up after her husband and adult son. She reports additional stress related to not settling deceased  father's estate , conflict with her middle sister, and issues at work.  Patient reports continuing to do  well since last session. She has continued positive self-care and reports becoming more mindful of her thoughts.  She reports practicing mindfulness using breath awareness. She has been using a meditation app from [facility] which has been helpful. She cites examples of identifying, challenging, and replacing negative thoughts with healthy  alternatives. In some situations, she states not being able to change some thoughts but being able to let them go rather than dwelling on the thoughts. She reports less stress on her job when she is not included in decision making as she recognizes her role as employee instead of employer. She reports son moved into his own apartment May 1. She says she experienced sadness and and anger almost like going through grief when he moved. She says she  is learning to accept it as she is pleased with her son's independence and maturity.    Suicidal/Homicidal: None   Therapist Response: Reviewed symptoms, praised and reinforced patient's continued self care efforts and practicing mindfulness meditation, discussed results, facilitated patient's thoughts and feelings about son moving, normalized feelings, discussed mindfulness and emotions, discussed how mindful awareness of emotions can be used to manage intense emotions, explained how to use STOP method to help patient pause throughout the day to recognize emotions, introduced and discussed the Emotions Wheel to help patient identify emotions, discussed rationale for and practiced completing mindful emotions journal, assigned patient to complete daily and bring to next session, assigned patient to review handouts provided in session. Plan: Return again in 2 weeks.  Diagnosis: Axis I: Major depressive disorder,  recurrent, moderate    Axis II: Deferred    Esbeidy Mclaine, LCSW 02/06/2018

## 2018-02-10 DIAGNOSIS — D229 Melanocytic nevi, unspecified: Secondary | ICD-10-CM | POA: Diagnosis not present

## 2018-02-10 DIAGNOSIS — L570 Actinic keratosis: Secondary | ICD-10-CM | POA: Diagnosis not present

## 2018-02-10 DIAGNOSIS — L821 Other seborrheic keratosis: Secondary | ICD-10-CM | POA: Diagnosis not present

## 2018-02-15 DIAGNOSIS — F3340 Major depressive disorder, recurrent, in remission, unspecified: Secondary | ICD-10-CM | POA: Diagnosis not present

## 2018-02-15 DIAGNOSIS — K210 Gastro-esophageal reflux disease with esophagitis: Secondary | ICD-10-CM | POA: Diagnosis not present

## 2018-02-27 ENCOUNTER — Ambulatory Visit (HOSPITAL_COMMUNITY): Payer: BLUE CROSS/BLUE SHIELD | Admitting: Psychiatry

## 2018-03-08 DIAGNOSIS — M545 Low back pain: Secondary | ICD-10-CM | POA: Diagnosis not present

## 2018-03-20 ENCOUNTER — Ambulatory Visit (HOSPITAL_COMMUNITY): Payer: BLUE CROSS/BLUE SHIELD | Admitting: Psychiatry

## 2018-03-20 ENCOUNTER — Encounter (HOSPITAL_COMMUNITY): Payer: Self-pay | Admitting: Psychiatry

## 2018-03-20 DIAGNOSIS — F331 Major depressive disorder, recurrent, moderate: Secondary | ICD-10-CM | POA: Diagnosis not present

## 2018-03-20 NOTE — Progress Notes (Signed)
THERAPIST PROGRESS NOTE                     Session Time:  Monday 03/20/2018 8:10 PM - 8:54 AM               Participation Level: Active         Behavioral Response: Well GroomedAlert/euthymic                        Type of Therapy: Individual Therapy  Treatment Goals addressed:   1. Learn and implement conflict resolution skills to resolve interpersonal problems       2. Identify and replace thoughts and beliefs that support depression and anxiety             Interventions: CBT   Summary: Erica Lynch is a 57 y.o. female who is referred for services by PCP Dr. Willey Blade due to patient experiencing symptoms of depression and anxiety. She is a returning patient to this clinician and last was seen in 2014. She initially was seen in this practice in 2012.  She states her emotions are out of control and  reports feeling angry, anxious, unhappy, and crying a lot. She says she feels as if she will never be happy again. She also  states she doesn't want to be around people. Her main stressor is her marriage. She and her husband began to experience increased problems in their marriage when son left for college 2 years ago. They have little in common now and often disagree about money and caring for thier possessions. Patient also reports discord related to her lack of interest in sex since she has been menopausal. She states being tired of picking up after her husband and adult son. She reports additional stress related to not settling deceased  father's estate , conflict with her middle sister, and issues at work.  Patient reports continuing to do  well since last session. She has continued positive self-care and has maintained social involvement. She reports improved interaction in her relationships with husband and son. She reports coping better with work stress and demands. She completed homework and reports this has helped her become more mindful of emotions, thoughts, and behaviors.  She reports recognizing disturbing emotions more quickly.   Suicidal/Homicidal: None   Therapist Response: Reviewed symptoms, praised and reinforced patient's continued self care efforts and socialization, praised and reinforced patient's completion of homework, reviewed homework and discussed/addressed any difficulties, discussed effects of doing homework, discussed rationale for continued used of mindfulness practice, assigned patient to continue using mindful emotions journal and bring to next session,  discussed step down plan for termination which will include 1 session in July and a termination session in August.   Plan: Return again in 2-4 weeks.   Diagnosis: Axis I: Major depressive disorder, recurrent, moderate    Axis II: Deferred    BYNUM,[name], LCSW 03/20/2018

## 2018-03-27 ENCOUNTER — Ambulatory Visit: Payer: BLUE CROSS/BLUE SHIELD | Admitting: Orthopedic Surgery

## 2018-03-27 ENCOUNTER — Encounter: Payer: Self-pay | Admitting: Orthopedic Surgery

## 2018-03-27 VITALS — BP 117/78 | HR 105 | Ht 64.0 in | Wt 189.0 lb

## 2018-03-27 DIAGNOSIS — M7711 Lateral epicondylitis, right elbow: Secondary | ICD-10-CM

## 2018-03-27 NOTE — Progress Notes (Signed)
Progress Note   Patient ID: Erica Lynch, female   DOB: 1961-05-19, 57 y.o.   MRN: [id number]   Chief Complaint  Patient presents with  . Elbow Pain    right elbow improving     57 year old female fell injured her right elbow we treated her for tennis elbow with bracing exercises ice and over-the-counter NSAIDs  She indicates that her elbow has made significant improvement although she could not wear the tennis elbow brace for the full day it interfered with her work activities.      Review of Systems  Neurological: Negative for tingling.     No Known Allergies   BP 117/78   Pulse (!) 105   Ht 5\' 4"  (1.626 m)   Wt 189 lb (85.7 kg)   BMI 32.44 kg/m   Physical Exam  Constitutional: She is oriented to person, place, and time. She appears well-developed and well-nourished.  Musculoskeletal:       Right elbow: She exhibits normal range of motion, no swelling, no effusion, no deformity and no laceration. Tenderness found.       Arms: Neurological: She is alert and oriented to person, place, and time.  Psychiatric: She has a normal mood and affect. Judgment normal.  Vitals reviewed.    Medical decisions:   Data  Imaging:   no  Encounter Diagnosis  Name Primary?  . Lateral epicondylitis of right elbow Yes    PLAN:   Continue ice daily Brace 4  hours per day Continue exercises Follow-up as needed    Arther Abbott, MD 03/27/2018 8:51 AM

## 2018-04-06 ENCOUNTER — Ambulatory Visit (HOSPITAL_COMMUNITY): Payer: BLUE CROSS/BLUE SHIELD | Admitting: Psychiatry

## 2018-04-06 DIAGNOSIS — F331 Major depressive disorder, recurrent, moderate: Secondary | ICD-10-CM | POA: Diagnosis not present

## 2018-04-06 NOTE — Progress Notes (Signed)
THERAPIST PROGRESS NOTE                     Session Time:  Thursday 04/06/2018 8:10 AM -  8:55 AM                Participation Level: Active         Behavioral Response: Well GroomedAlert/euthymic                        Type of Therapy: Individual Therapy  Treatment Goals addressed:   1. Learn and implement conflict resolution skills to resolve interpersonal problems       2. Identify and replace thoughts and beliefs that support depression and anxiety             Interventions: CBT   Summary: Erica Lynch is a 57 y.o. female who is referred for services by PCP Dr. Willey Blade due to patient experiencing symptoms of depression and anxiety. She is a returning patient to this clinician and last was seen in 2014. She initially was seen in this practice in 2012.  She states her emotions are out of control and  reports feeling angry, anxious, unhappy, and crying a lot. She says she feels as if she will never be happy again. She also  states she doesn't want to be around people. Her main stressor is her marriage. She and her husband began to experience increased problems in their marriage when son left for college 2 years ago. They have little in common now and often disagree about money and caring for thier possessions. Patient also reports discord related to her lack of interest in sex since she has been menopausal. She states being tired of picking up after her husband and adult son. She reports additional stress related to not settling deceased  father's estate , conflict with her middle sister, and issues at work.  Patient reports continuing to do  well since last session. She has continued positive self-care and has maintained social involvement. She reports continued improved interaction in her relationships with husband and son. She reports coping better with work stress and demands. She completed homework and reports increased use of mindfulness skills.    Suicidal/Homicidal: None   Therapist Response: Reviewed symptoms, praised and reinforced patient's continued self care efforts and socialization, praised and reinforced patient's completion of homework, reviewed homework and discussed/addressed any difficulties, processed homework and assisted patient identify her use of mindfulness skills in situations and effects on her emotions/thoughts/behavior, discussed acceptance of emotions,  discussed rationale for continued use of mindfulness practice, assigned patient to continue using mindful emotions journal and bring to next session,discussed rationale for continued use of mindfulness practice and assigned patient to practice mindfulness activity daily, discussed the effects of using mindfulness practice in avoiding relapse of depression, discussed termination of services at next session in August 2019  Plan: Return again in 2-4 weeks.   Diagnosis: Axis I: Major depressive disorder, recurrent, moderate    Axis II: Deferred    Vinay Ertl, LCSW 04/06/2018

## 2018-05-10 ENCOUNTER — Ambulatory Visit (INDEPENDENT_AMBULATORY_CARE_PROVIDER_SITE_OTHER): Payer: BLUE CROSS/BLUE SHIELD | Admitting: Psychiatry

## 2018-05-10 DIAGNOSIS — F331 Major depressive disorder, recurrent, moderate: Secondary | ICD-10-CM | POA: Diagnosis not present

## 2018-05-10 NOTE — Progress Notes (Signed)
THERAPIST PROGRESS NOTE                     Session Time:  Wednesday 05/10/2018 8:15 AM - 9:10 AM                 Participation Level: Active         Behavioral Response: Well GroomedAlert/euthymic                        Type of Therapy: Individual Therapy  Treatment Goals addressed:   1. Learn and implement conflict resolution skills to resolve interpersonal problems       2. Identify and replace thoughts and beliefs that support depression and anxiety             Interventions: CBT   Summary: Erica Lynch is a 57 y.o. female who is referred for services by PCP Dr. Willey Blade due to patient experiencing symptoms of depression and anxiety. She is a returning patient to this clinician and last was seen in 2014. She initially was seen in this practice in 2012.  She states her emotions are out of control and  reports feeling angry, anxious, unhappy, and crying a lot. She says she feels as if she will never be happy again. She also  states she doesn't want to be around people. Her main stressor is her marriage. She and her husband began to experience increased problems in their marriage when son left for college 2 years ago. They have little in common now and often disagree about money and caring for thier possessions. Patient also reports discord related to her lack of interest in sex since she has been menopausal. She states being tired of picking up after her husband and adult son. She reports additional stress related to not settling deceased  father's estate , conflict with her middle sister, and issues at work.  Patient last was seen about a month ago and  reports continuing to do  well since last session. She has continued positive self-care and has maintained social involvement. She reports continued improved interaction in her relationships with husband and son. She reports coping better with work stress and demands. She completed homework and reports continued  use of  mindfulness skills. She is pleased with her progress and cites examples of managing situations, thoughts, and emotions in helpful manner.    Suicidal/Homicidal: None   Therapist Response: Reviewed symptoms, praised and reinforced patient's continued self care efforts and socialization, praised and reinforced patient's completion of homework, reviewed and processed homework, discussed using mindfulness to help patient maintain balance in life to manage stress, assisted her develop a plan of self-care including identifying activities in various domains that give her energy, discussed lapse versus relapse, assisted patient identify early warning signs of depression and reviewed ways to intervene, assisted patient develop mental health maintenance plan, reviewed patient's progess and facilitated expression of thoughts and feelings about ending therapy, did termination  Plan: Patient has accomplished her goals. Therefore, psychotherapy services are being terminated. Patient will continue to see her provider for medication management. She is encouraged to call this practice should she need psychotherapy services in the future.   Diagnosis: Axis I: Major depressive disorder, recurrent, moderate    Axis II: Deferred    Erica Callegari, LCSW 05/10/2018       Outpatient Therapist Discharge Summary  Erica Lynch    September 02, 1961   Admission Date: 05/07/2015  Discharge Date: 05/10/2018 Reason  for Discharge: Treatment completed Diagnosis:  Axis I:  Major Depressive Disorder    Comments: Patient will continue to see her provider for medication management. She is encouraged to call this practice should she need psychotherapy services in the future.   Oree Mirelez E Darius Lundberg LCSW

## 2018-05-15 ENCOUNTER — Ambulatory Visit (HOSPITAL_COMMUNITY): Payer: BLUE CROSS/BLUE SHIELD | Admitting: Psychiatry

## 2018-05-18 DIAGNOSIS — N959 Unspecified menopausal and perimenopausal disorder: Secondary | ICD-10-CM | POA: Diagnosis not present

## 2018-05-18 DIAGNOSIS — F329 Major depressive disorder, single episode, unspecified: Secondary | ICD-10-CM | POA: Diagnosis not present

## 2018-05-25 ENCOUNTER — Other Ambulatory Visit: Payer: Self-pay | Admitting: Obstetrics and Gynecology

## 2018-05-25 DIAGNOSIS — Z1231 Encounter for screening mammogram for malignant neoplasm of breast: Secondary | ICD-10-CM

## 2018-06-13 ENCOUNTER — Ambulatory Visit (HOSPITAL_COMMUNITY): Payer: BLUE CROSS/BLUE SHIELD | Admitting: Psychiatry

## 2018-06-15 ENCOUNTER — Ambulatory Visit (HOSPITAL_COMMUNITY)
Admission: RE | Admit: 2018-06-15 | Discharge: 2018-06-15 | Disposition: A | Discharge status: Home / Self Care | Payer: BLUE CROSS/BLUE SHIELD | Source: Ambulatory Visit | Attending: Obstetrics and Gynecology | Admitting: Obstetrics and Gynecology

## 2018-06-15 DIAGNOSIS — Z1231 Encounter for screening mammogram for malignant neoplasm of breast: Secondary | ICD-10-CM | POA: Diagnosis not present

## 2018-07-18 DIAGNOSIS — L570 Actinic keratosis: Secondary | ICD-10-CM | POA: Diagnosis not present

## 2018-07-18 DIAGNOSIS — D229 Melanocytic nevi, unspecified: Secondary | ICD-10-CM | POA: Diagnosis not present

## 2018-08-17 DIAGNOSIS — N959 Unspecified menopausal and perimenopausal disorder: Secondary | ICD-10-CM | POA: Diagnosis not present

## 2018-08-17 DIAGNOSIS — Z23 Encounter for immunization: Secondary | ICD-10-CM | POA: Diagnosis not present

## 2018-08-17 DIAGNOSIS — F329 Major depressive disorder, single episode, unspecified: Secondary | ICD-10-CM | POA: Diagnosis not present

## 2018-08-21 IMAGING — MG 2D DIGITAL SCREENING BILATERAL MAMMOGRAM WITH CAD AND ADJUNCT TO
8 series · 8 of 24 positions shown · non-contrast
Comparison: Previous exam(s).

CLINICAL DATA: Screening.

EXAM:
2D DIGITAL SCREENING BILATERAL MAMMOGRAM WITH CAD AND ADJUNCT TOMO

[L MLO]
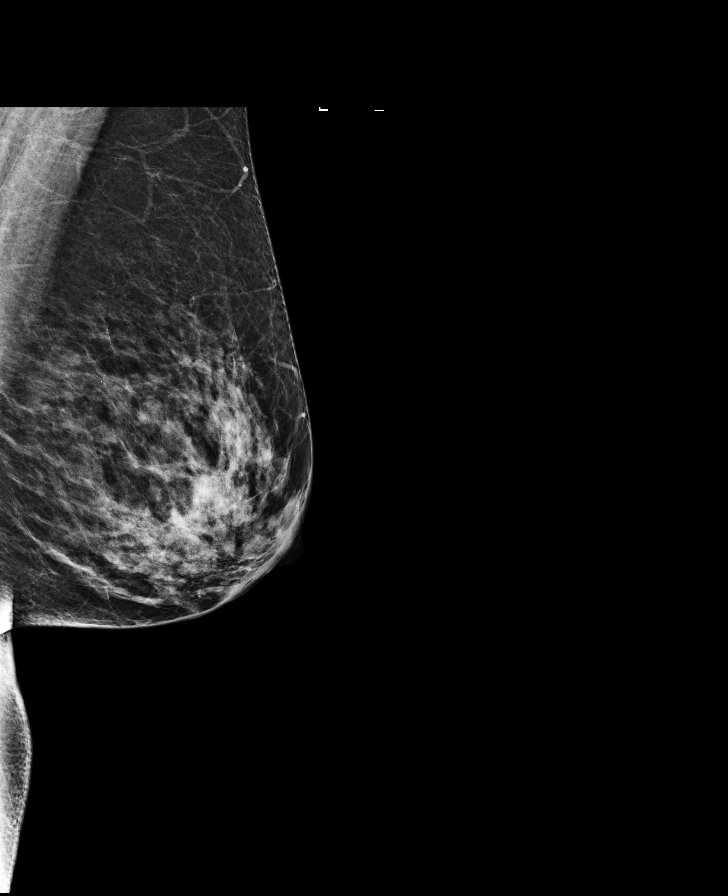

[R MLO]
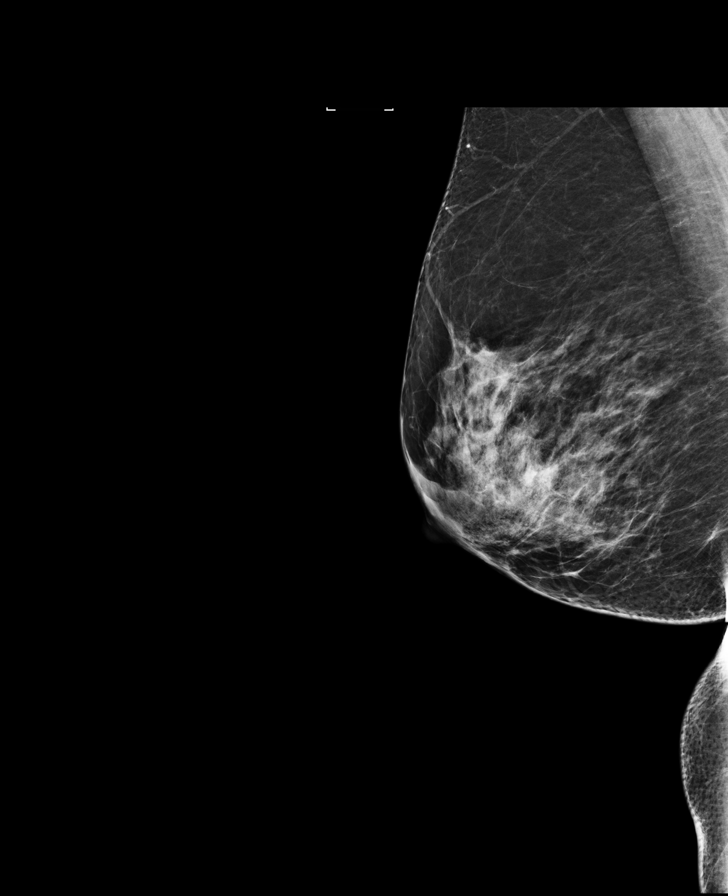

[L CC]
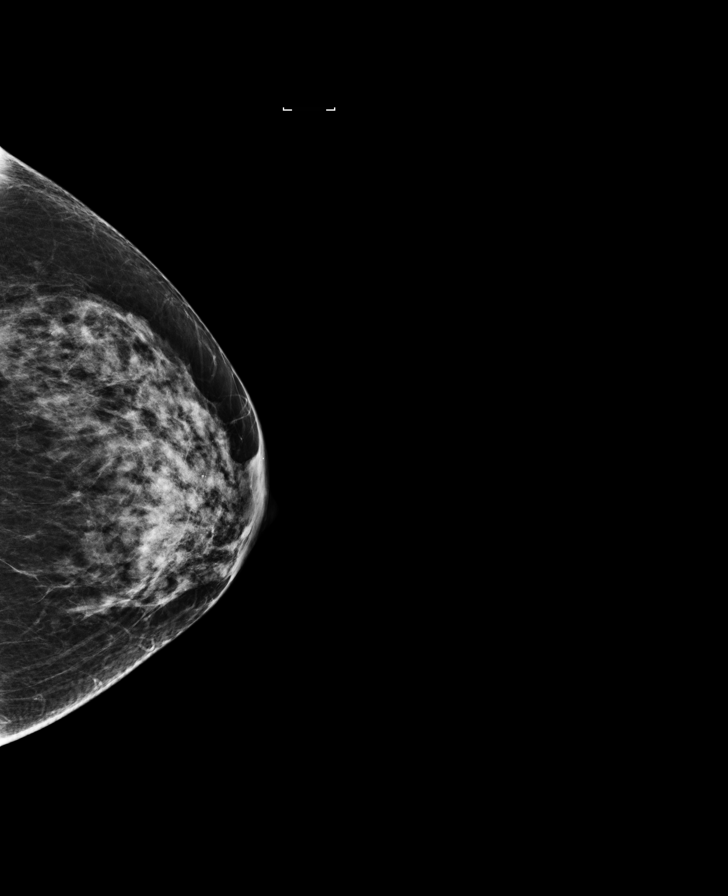

[R CC]
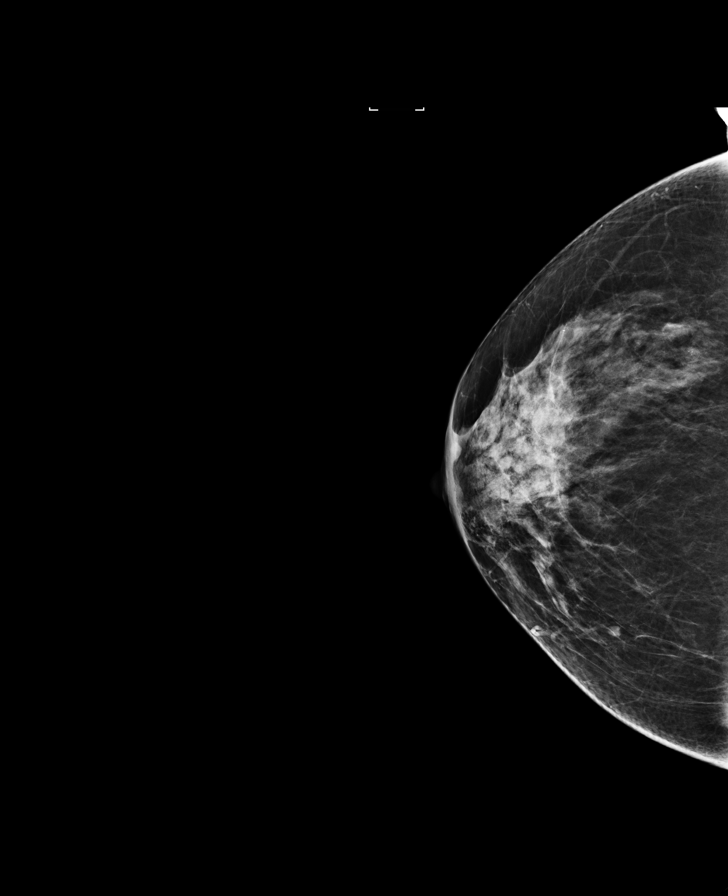

[R MLO tomo · tomo slice 34/67.0]
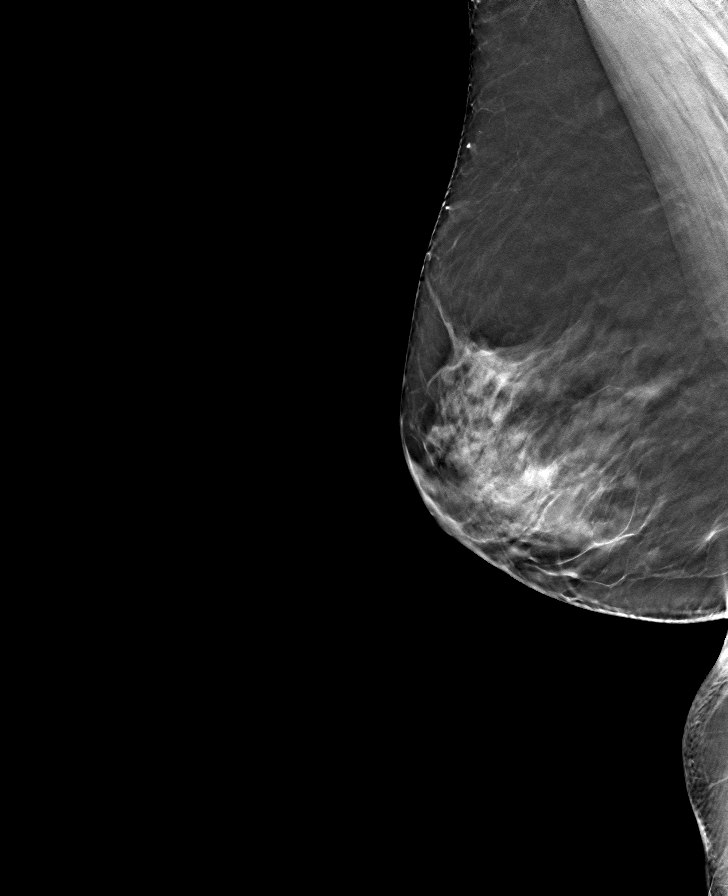

[R CC tomo · tomo slice 33/66.0]
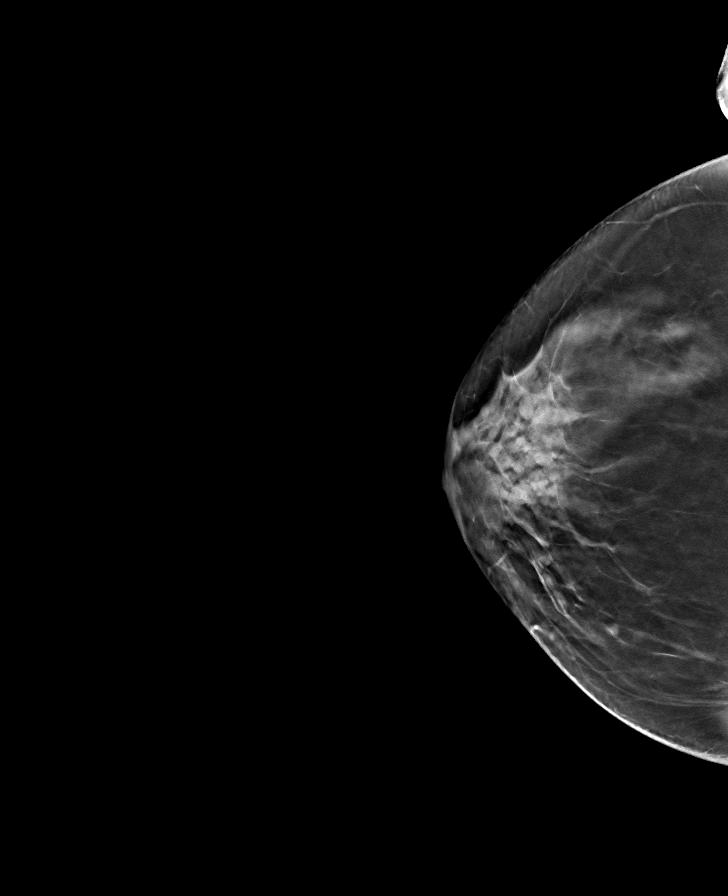

[L CC tomo · tomo slice 33/64.0]
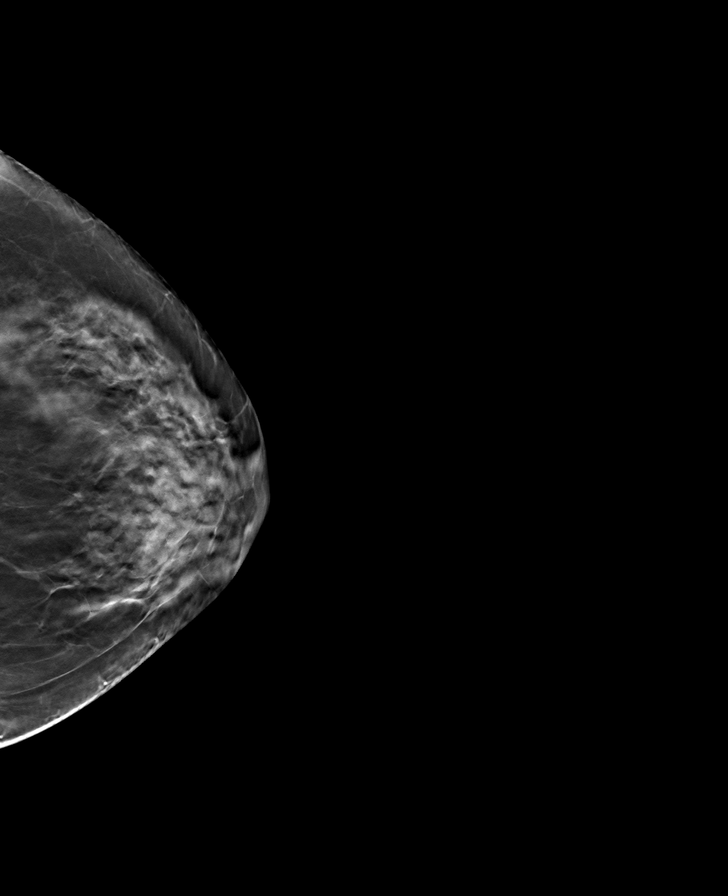

[L MLO tomo · tomo slice 35/69.0]
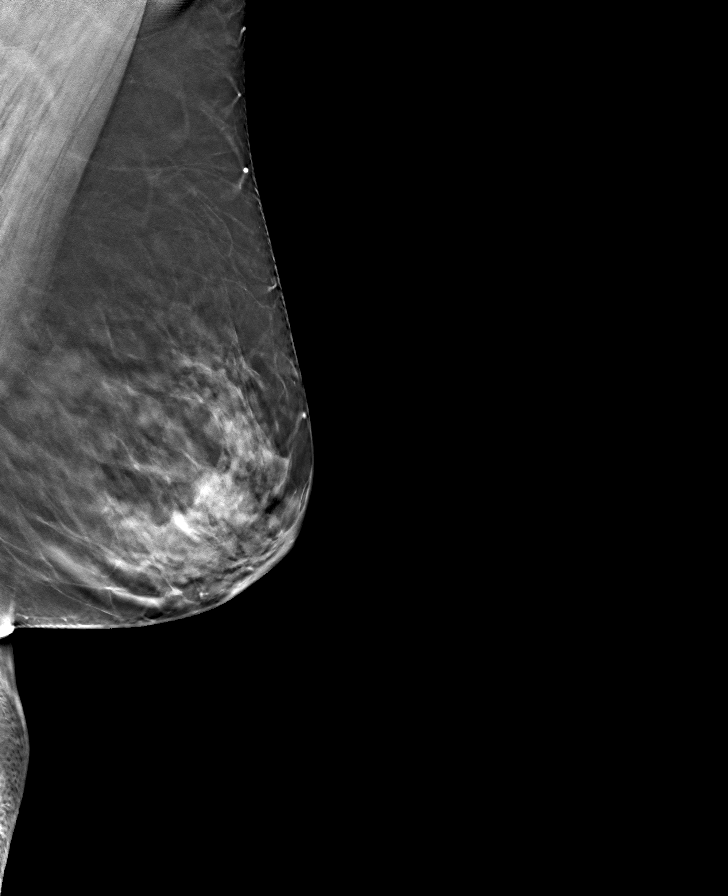

[8 of 24 positions shown; findings below may reference images not displayed]

ACR Breast Density Category c: The breast tissue is heterogeneously
dense, which may obscure small masses.
FINDINGS: There are no findings suspicious for malignancy. Images were
processed with CAD.
IMPRESSION: No mammographic evidence of malignancy. A result letter of this
screening mammogram will be mailed directly to the patient.

RECOMMENDATION:
Screening mammogram in one year. (Code:[id number])

BI-RADS CATEGORY  1: Negative.

## 2018-11-03 DIAGNOSIS — L570 Actinic keratosis: Secondary | ICD-10-CM | POA: Diagnosis not present

## 2018-11-09 DIAGNOSIS — H524 Presbyopia: Secondary | ICD-10-CM | POA: Diagnosis not present

## 2018-11-09 DIAGNOSIS — H538 Other visual disturbances: Secondary | ICD-10-CM | POA: Diagnosis not present

## 2018-12-06 ENCOUNTER — Ambulatory Visit: Payer: BLUE CROSS/BLUE SHIELD | Admitting: Gastroenterology

## 2018-12-06 ENCOUNTER — Encounter: Payer: Self-pay | Admitting: Gastroenterology

## 2018-12-06 ENCOUNTER — Other Ambulatory Visit: Payer: Self-pay

## 2018-12-06 DIAGNOSIS — K648 Other hemorrhoids: Secondary | ICD-10-CM | POA: Insufficient documentation

## 2018-12-06 DIAGNOSIS — K640 First degree hemorrhoids: Secondary | ICD-10-CM

## 2018-12-06 DIAGNOSIS — K649 Unspecified hemorrhoids: Secondary | ICD-10-CM | POA: Insufficient documentation

## 2018-12-06 MED ORDER — HYDROCORTISONE 2.5 % RE CREA: 1.0000 "application " | TOPICAL_CREAM | Freq: Two times a day (BID) | RECTAL | 2 refills | Status: DC

## 2018-12-06 NOTE — Assessment & Plan Note (Signed)
58 year old very pleasant female clinical signs/symptoms of symptomatic Grade 1 hemorrhoids. No rectal bleeding noted. Anoscopy performed today without mass or other concerning signs. Last colonoscopy in 2012. No need for colonoscopy currently unless rectal bleeding, worsening symptoms despite supportive measures. I did discuss outpatient banding with her, which she will consider in future. We also discussed signs/symptoms that would warrant a colonoscopy. I have sent in Anusol cream to pharmacy and asked her to add fiber to her diet. Will have her return in 6-8 weeks.

## 2018-12-06 NOTE — Progress Notes (Signed)
CC'ED TO PCP 

## 2018-12-06 NOTE — Progress Notes (Signed)
cc'ed to pcp °

## 2018-12-06 NOTE — Patient Instructions (Addendum)
You are dealing with symptoms of internal hemorrhoids. Although yours are small, it can still cause what you have been experiencing.   I have sent in Anusol cream to use per rectum twice a day. Use for about 7 days then take a break for a few days, then you can resume again.   For a good bowel regimen and to avoid straining, start Benefiber 1 tbsp daily and increase to up to 3 times a day as tolerated. You can use the generic at [place], [facility], etc.   I will see you in about 6-8 weeks! Let me know if you would like to pursue outpatient banding.  If you starting having worsening of symptoms, rectal bleeding, or other concerning signs, we will arrange a colonoscopy.  It was a pleasure to see you today. I strive to create trusting relationships with patients to provide genuine, compassionate, and quality care. I value your feedback. If you receive a survey regarding your visit,  I greatly appreciate you taking time to fill this out.   Annitta Needs, PhD, ANP-BC [facility]    Hemorrhoids Hemorrhoids are swollen veins in and around the rectum or anus. There are two types of hemorrhoids:  Internal hemorrhoids. These occur in the veins that are just inside the rectum. They may poke through to the outside and become irritated and painful.  External hemorrhoids. These occur in the veins that are outside the anus and can be felt as a painful swelling or hard lump near the anus. Most hemorrhoids do not cause serious problems, and they can be managed with home treatments such as diet and lifestyle changes. If home treatments do not help the symptoms, procedures can be done to shrink or remove the hemorrhoids. What are the causes? This condition is caused by increased pressure in the anal area. This pressure may result from various things, including:  Constipation.  Straining to have a bowel movement.  Diarrhea.  Pregnancy.  Obesity.  Sitting for long periods of  time.  Heavy lifting or other activity that causes you to strain.  Anal sex.  Riding a bike for a long period of time. What are the signs or symptoms? Symptoms of this condition include:  Pain.  Anal itching or irritation.  Rectal bleeding.  Leakage of stool (feces).  Anal swelling.  One or more lumps around the anus. How is this diagnosed? This condition can often be diagnosed through a visual exam. Other exams or tests may also be done, such as:  An exam that involves feeling the rectal area with a gloved hand (digital rectal exam).  An exam of the anal canal that is done using a small tube (anoscope).  A blood test, if you have lost a significant amount of blood.  A test to look inside the colon using a flexible tube with a camera on the end (sigmoidoscopy or colonoscopy). How is this treated? This condition can usually be treated at home. However, various procedures may be done if dietary changes, lifestyle changes, and other home treatments do not help your symptoms. These procedures can help make the hemorrhoids smaller or remove them completely. Some of these procedures involve surgery, and others do not. Common procedures include:  Rubber band ligation. Rubber bands are placed at the base of the hemorrhoids to cut off their blood supply.  Sclerotherapy. Medicine is injected into the hemorrhoids to shrink them.  Infrared coagulation. A type of light energy is used to get rid of the hemorrhoids.  Hemorrhoidectomy surgery. The hemorrhoids are surgically removed, and the veins that supply them are tied off.  Stapled hemorrhoidopexy surgery. The surgeon staples the base of the hemorrhoid to the rectal wall. Follow these instructions at home: Eating and drinking   Eat foods that have a lot of fiber in them, such as whole grains, beans, nuts, fruits, and vegetables.  Ask your health care provider about taking products that have added fiber (fiber  supplements).  Reduce the amount of fat in your diet. You can do this by eating low-fat dairy products, eating less red meat, and avoiding processed foods.  Drink enough fluid to keep your urine pale yellow. Managing pain and swelling   Take warm sitz baths for 20 minutes, 3-4 times a day to ease pain and discomfort. You may do this in a bathtub or using a portable sitz bath that fits over the toilet.  If directed, apply ice to the affected area. Using ice packs between sitz baths may be helpful. ? Put ice in a plastic bag. ? Place a towel between your skin and the bag. ? Leave the ice on for 20 minutes, 2-3 times a day. General instructions  Take over-the-counter and prescription medicines only as told by your health care provider.  Use medicated creams or suppositories as told.  Get regular exercise. Ask your health care provider how much and what kind of exercise is best for you. In general, you should do moderate exercise for at least 30 minutes on most days of the week (150 minutes each week). This can include activities such as walking, biking, or yoga.  Go to the bathroom when you have the urge to have a bowel movement. Do not wait.  Avoid straining to have bowel movements.  Keep the anal area dry and clean. Use wet toilet paper or moist towelettes after a bowel movement.  Do not sit on the toilet for long periods of time. This increases blood pooling and pain.  Keep all follow-up visits as told by your health care provider. This is important. Contact a health care provider if you have:  Increasing pain and swelling that are not controlled by treatment or medicine.  Difficulty having a bowel movement, or you are unable to have a bowel movement.  Pain or inflammation outside the area of the hemorrhoids. Get help right away if you have:  Uncontrolled bleeding from your rectum. Summary  Hemorrhoids are swollen veins in and around the rectum or anus.  Most hemorrhoids  can be managed with home treatments such as diet and lifestyle changes.  Taking warm sitz baths can help ease pain and discomfort.  In severe cases, procedures or surgery can be done to shrink or remove the hemorrhoids. This information is not intended to replace advice given to you by your health care provider. Make sure you discuss any questions you have with your health care provider. Document Released: 09/10/2000 Document Revised: 02/02/2018 Document Reviewed: 02/02/2018 Elsevier Interactive Patient Education  2019 [employer].

## 2018-12-06 NOTE — Progress Notes (Signed)
Referring Provider: Asencion Noble, MD Primary Care Physician:  Asencion Noble, MD  Primary GI: Dr. Gala Romney   Chief Complaint  Patient presents with  . Anal Itching    HPI:   Erica Lynch is a 58 y.o. female presenting today with history of GERD, last seen in April 2019. Returning today due to concern for anal itching. Last colonoscopy 2012 normal.   For last 4-5 weeks has had rectal itching. Notes history of fissure but does not feel the same as before. No pain with BM. Sometimes feels like thorns inside but not painful. Sometimes prickly sensations. Not associated with BM. No rectal bleeding. Can't get herself clean. Has to wipe multiple times. No fecal smearing or leakage. Some intermittent constipation but not severe. Some straining occasionally. Doesn't feel protrusion. OTC cream like prep H, witch hazel.   GERD: no longer on Dexilant. Reflux is much improved.   Past Medical History:  Diagnosis Date  . Anxiety   . Depression   . Fatigue 12/04/2015  . GERD (gastroesophageal reflux disease)   . Hematuria 05/22/2014  . History of breast lump 02/19/2014  . Hot flashes 12/04/2015  . Hyperlipidemia   . IBS (irritable bowel syndrome)   . Left breast mass 11/07/2013  . LLQ pain 05/22/2014  . Lumbar herniated disc November 2016  . Other and unspecified ovarian cyst 05/23/2014  . Unspecified symptom associated with female genital organs 05/22/2014  . Vitamin D deficiency 12/05/2015    Past Surgical History:  Procedure Laterality Date  . CESAREAN SECTION  1996  . COLONOSCOPY  08/2011   Dr. Gala Romney: normal, next colonoscopy December 2022.  . ENDOMETRIAL ABLATION  2-3 yrs ago   [facility]  . ESOPHAGOGASTRODUODENOSCOPY (EGD) WITH PROPOFOL N/A 11/21/2017   Dr. Gala Romney: Normal esophagus, status post dilation, small hiatal hernia  . MALONEY DILATION N/A 11/21/2017   Procedure: Venia Minks DILATION;  Surgeon: Daneil Dolin, MD;  Location: [facility];  Service: Endoscopy;  Laterality: N/A;  .  OTHER SURGICAL HISTORY     Uterine ablation  . TUBAL LIGATION  6 yrs ago   [facility]    Current Outpatient Medications  Medication Sig Dispense Refill  . clonazePAM (KLONOPIN) 1 MG tablet Take 1 tablet (1 mg total) by mouth 2 (two) times daily as needed for anxiety. 60 tablet 5  . estradiol (ESTRACE VAGINAL) 0.1 MG/GM vaginal cream Use 1 gm in vagina 2 x weekly 42.5 g 3  . estrogens, conjugated, (PREMARIN) 0.625 MG tablet Take 1 tablet (0.625 mg total) by mouth daily. 30 tablet 12  . progesterone (PROMETRIUM) 200 MG capsule TAKE (1) CAPSULE BY MOUTH ONCE DAILY. 30 capsule 0  . hydrocortisone (ANUSOL-HC) 2.5 % rectal cream Place 1 application rectally 2 (two) times daily. 30 g 2   No current facility-administered medications for this visit.     Allergies as of 12/06/2018  . (No Known Allergies)    Family History  Problem Relation Age of Onset  . Lung cancer Mother        deceased from lung cancer at age 31  . Breast cancer Mother   . Cancer Mother        breast at age 90  . Depression Mother   . Cirrhosis Father        etoh   . Anxiety disorder Father   . Alcohol abuse Father   . Depression Father   . Diabetes Maternal Grandfather   . Heart disease Maternal Grandfather   .  Depression Maternal Grandmother   . Depression Maternal Uncle   . Depression Maternal Uncle   . Anesthesia problems Neg Hx   . Hypotension Neg Hx   . Malignant hyperthermia Neg Hx   . Pseudochol deficiency Neg Hx   . Colon cancer Neg Hx   . Colon polyps Neg Hx     Social History   Socioeconomic History  . Marital status: Married    Spouse name: Not on file  . Number of children: 1  . Years of education: Not on file  . Highest education level: Not on file  Occupational History  . Occupation: Artist: [employer]  . Financial resource strain: Not on file  . Food insecurity:    Worry: Not on file    Inability: Not on file  . Transportation needs:     Medical: Not on file    Non-medical: Not on file  Tobacco Use  . Smoking status: Never Smoker  . Smokeless tobacco: Never Used  Substance and Sexual Activity  . Alcohol use: No    Frequency: Never  . Drug use: No  . Sexual activity: Not Currently    Birth control/protection: Surgical    Comment: tubal and ablation  Lifestyle  . Physical activity:    Days per week: Not on file    Minutes per session: Not on file  . Stress: Not on file  Relationships  . Social connections:    Talks on phone: Not on file    Gets together: Not on file    Attends religious service: Not on file    Active member of club or organization: Not on file    Attends meetings of clubs or organizations: Not on file    Relationship status: Not on file  Other Topics Concern  . Not on file  Social History Narrative   1 son-16          Review of Systems: Gen: Denies fever, chills, anorexia. Denies fatigue, weakness, weight loss.  CV: Denies chest pain, palpitations, syncope, peripheral edema, and claudication. Resp: Denies dyspnea at rest, cough, wheezing, coughing up blood, and pleurisy. GI: see HPI Derm: Denies rash, itching, dry skin Psych: Denies depression, anxiety, memory loss, confusion. No homicidal or suicidal ideation.  Heme: Denies bruising, bleeding, and enlarged lymph nodes.  Physical Exam: BP 121/78   Pulse 82   Temp (!) 96.9 F (36.1 C) (Oral)   Ht 5\' 4"  (1.626 m)   Wt 193 lb 9.6 oz (87.8 kg)   BMI 33.23 kg/m  General:   Alert and oriented. No distress noted. Pleasant and cooperative.  Head:  Normocephalic and atraumatic. Eyes:  Conjuctiva clear without scleral icterus. Mouth:  Oral mucosa pink and moist.  Lungs: clear to auscultation bilaterally Cardiac: S1 S2 present without murmurs Abdomen:  +BS, soft, non-tender and non-distended. No rebound or guarding. No HSM or masses noted. Rectal: no external mass, lesions, erythema. Internal exam without abnormalities. Anoscopy then  performed with patient in left lateral decubitus position. Prominent right posterior internal hemorrhoids, followed by left lateral and right anterior. No mass. No overt bleeding. Patient tolerated well.   Msk:  Symmetrical without gross deformities. Normal posture. Extremities:  Without edema. Neurologic:  Alert and  oriented x4 Psych:  Alert and cooperative. Normal mood and affect.

## 2018-12-08 DIAGNOSIS — F329 Major depressive disorder, single episode, unspecified: Secondary | ICD-10-CM | POA: Diagnosis not present

## 2018-12-08 DIAGNOSIS — Z780 Asymptomatic menopausal state: Secondary | ICD-10-CM | POA: Diagnosis not present

## 2018-12-08 DIAGNOSIS — Z79899 Other long term (current) drug therapy: Secondary | ICD-10-CM | POA: Diagnosis not present

## 2018-12-15 DIAGNOSIS — E785 Hyperlipidemia, unspecified: Secondary | ICD-10-CM | POA: Diagnosis not present

## 2018-12-15 DIAGNOSIS — N951 Menopausal and female climacteric states: Secondary | ICD-10-CM | POA: Diagnosis not present

## 2018-12-29 ENCOUNTER — Other Ambulatory Visit: Payer: Self-pay | Admitting: Adult Health

## 2019-02-14 ENCOUNTER — Ambulatory Visit: Payer: BLUE CROSS/BLUE SHIELD | Admitting: Gastroenterology

## 2019-02-26 ENCOUNTER — Other Ambulatory Visit: Payer: Self-pay | Admitting: Adult Health

## 2019-03-23 ENCOUNTER — Other Ambulatory Visit: Payer: BLUE CROSS/BLUE SHIELD | Admitting: Adult Health

## 2019-03-26 ENCOUNTER — Other Ambulatory Visit: Payer: Self-pay | Admitting: Adult Health

## 2019-03-28 NOTE — Progress Notes (Signed)
Referring Provider: Asencion Noble, MD Primary Care Physician:  Asencion Noble, MD  Primary GI: Dr. Gala Romney   Chief Complaint  Patient presents with  . Hemorrhoids    doing real well now    HPI:   Erica Lynch is a 58 y.o. female presenting today returning after visit in March 2020 for symptomatic hemorrhoids. Colonoscopy in 2012 normal. Anoscopy at last visit with internal hemorrhoids. Recommended fiber and Anusol at last visit.   She only had to use Anusol for one week. Symptoms completely resolved now. Intake with fiber is mainly through food sources. NO abdominal pain, rectal bleeding. No straining. Spends minimal time on toilet. Tells me she recently had to put her dog of 14 years to sleep, so it's been a difficult week. Otherwise, doing well from a GI standpoint. No GERD issues. No PPI.   Past Medical History:  Diagnosis Date  . Anxiety   . Depression   . Fatigue 12/04/2015  . GERD (gastroesophageal reflux disease)   . Hematuria 05/22/2014  . History of breast lump 02/19/2014  . Hot flashes 12/04/2015  . Hyperlipidemia   . IBS (irritable bowel syndrome)   . Left breast mass 11/07/2013  . LLQ pain 05/22/2014  . Lumbar herniated disc November 2016  . Other and unspecified ovarian cyst 05/23/2014  . Unspecified symptom associated with female genital organs 05/22/2014  . Vitamin D deficiency 12/05/2015    Past Surgical History:  Procedure Laterality Date  . CESAREAN SECTION  1996  . COLONOSCOPY  08/2011   Dr. Gala Romney: normal, next colonoscopy December 2022.  . ENDOMETRIAL ABLATION  2-3 yrs ago   [facility]  . ESOPHAGOGASTRODUODENOSCOPY (EGD) WITH PROPOFOL N/A 11/21/2017   Dr. Gala Romney: Normal esophagus, status post dilation, small hiatal hernia  . MALONEY DILATION N/A 11/21/2017   Procedure: Venia Minks DILATION;  Surgeon: Daneil Dolin, MD;  Location: [facility];  Service: Endoscopy;  Laterality: N/A;  . OTHER SURGICAL HISTORY     Uterine ablation  . TUBAL LIGATION  6 yrs ago    [facility]    Current Outpatient Medications  Medication Sig Dispense Refill  . clonazePAM (KLONOPIN) 1 MG tablet Take 1 tablet (1 mg total) by mouth 2 (two) times daily as needed for anxiety. 60 tablet 5  . estradiol (ESTRACE) 0.1 MG/GM vaginal cream USE 1 GRAM IN VAGINA TWICE WEEKLY. 42.5 g 11  . estrogens, conjugated, (PREMARIN) 0.625 MG tablet Take 1 tablet (0.625 mg total) by mouth daily. 30 tablet 12  . hydrocortisone (ANUSOL-HC) 2.5 % rectal cream Place 1 application rectally 2 (two) times daily. 30 g 2  . progesterone (PROMETRIUM) 200 MG capsule TAKE (1) CAPSULE BY MOUTH ONCE DAILY. 30 capsule 0   No current facility-administered medications for this visit.     Allergies as of 03/29/2019  . (No Known Allergies)    Family History  Problem Relation Age of Onset  . Lung cancer Mother        deceased from lung cancer at age 84  . Breast cancer Mother   . Cancer Mother        breast at age 20  . Depression Mother   . Cirrhosis Father        etoh   . Anxiety disorder Father   . Alcohol abuse Father   . Depression Father   . Diabetes Maternal Grandfather   . Heart disease Maternal Grandfather   . Depression Maternal Grandmother   . Depression Maternal  Uncle   . Depression Maternal Uncle   . Anesthesia problems Neg Hx   . Hypotension Neg Hx   . Malignant hyperthermia Neg Hx   . Pseudochol deficiency Neg Hx   . Colon cancer Neg Hx   . Colon polyps Neg Hx     Social History   Socioeconomic History  . Marital status: Married    Spouse name: Not on file  . Number of children: 1  . Years of education: Not on file  . Highest education level: Not on file  Occupational History  . Occupation: Artist: [employer]  . Financial resource strain: Not on file  . Food insecurity    Worry: Not on file    Inability: Not on file  . Transportation needs    Medical: Not on file    Non-medical: Not on file  Tobacco Use  .  Smoking status: Never Smoker  . Smokeless tobacco: Never Used  Substance and Sexual Activity  . Alcohol use: No    Frequency: Never  . Drug use: No  . Sexual activity: Not Currently    Birth control/protection: Surgical    Comment: tubal and ablation  Lifestyle  . Physical activity    Days per week: Not on file    Minutes per session: Not on file  . Stress: Not on file  Relationships  . Social Herbalist on phone: Not on file    Gets together: Not on file    Attends religious service: Not on file    Active member of club or organization: Not on file    Attends meetings of clubs or organizations: Not on file    Relationship status: Not on file  Other Topics Concern  . Not on file  Social History Narrative   1 son-16          Review of Systems: Gen: Denies fever, chills, anorexia. Denies fatigue, weakness, weight loss.  CV: Denies chest pain, palpitations, syncope, peripheral edema, and claudication. Resp: Denies dyspnea at rest, cough, wheezing, coughing up blood, and pleurisy. GI: see HPI Derm: Denies rash, itching, dry skin Psych: Denies depression, anxiety, memory loss, confusion. No homicidal or suicidal ideation.  Heme: Denies bruising, bleeding, and enlarged lymph nodes.  Physical Exam: BP 115/79   Pulse 77   Temp (!) 97.4 F (36.3 C) (Oral)   Ht 5\' 4"  (1.626 m)   Wt 192 lb 12.8 oz (87.5 kg)   BMI 33.09 kg/m  General:   Alert and oriented. No distress noted. Pleasant and cooperative.  Head:  Normocephalic and atraumatic. Eyes:  Conjuctiva clear without scleral icterus. Mouth:  Oral mucosa pink and moist.  Abdomen:  +BS, soft, non-tender and non-distended. No rebound or guarding. No HSM or masses noted. Msk:  Symmetrical without gross deformities. Normal posture. Extremities:  Without edema. Neurologic:  Alert and  oriented x4 Psych:  Alert and cooperative. Normal mood and affect.

## 2019-03-29 ENCOUNTER — Other Ambulatory Visit: Payer: Self-pay

## 2019-03-29 ENCOUNTER — Ambulatory Visit (INDEPENDENT_AMBULATORY_CARE_PROVIDER_SITE_OTHER): Payer: BC Managed Care – PPO | Admitting: Gastroenterology

## 2019-03-29 ENCOUNTER — Encounter: Payer: Self-pay | Admitting: Gastroenterology

## 2019-03-29 VITALS — BP 115/79 | HR 77 | Temp 97.4°F | Ht 64.0 in | Wt 192.8 lb

## 2019-03-29 DIAGNOSIS — K640 First degree hemorrhoids: Secondary | ICD-10-CM | POA: Diagnosis not present

## 2019-03-29 NOTE — Progress Notes (Signed)
CC'D TO PCP °

## 2019-03-29 NOTE — Patient Instructions (Signed)
I am glad you are doing well! Please call if any further hemorrhoid flares.   Next colonoscopy in 2022.   I am so sorry for your loss!  I enjoyed seeing you again today! As you know, I value our relationship and want to provide genuine, compassionate, and quality care. I welcome your feedback. If you receive a survey regarding your visit,  I greatly appreciate you taking time to fill this out. See you next time!  Annitta Needs, PhD, ANP-BC [facility]

## 2019-03-29 NOTE — Assessment & Plan Note (Signed)
Symptoms resolved with short course of Anusol. No need for banding currently. Continue with behavior modifications, fiber, avoidance of straining, decreased toilet time. Call if any further issues. Colonoscopy routine screening in 2022 unless clinically changes.

## 2019-04-13 ENCOUNTER — Encounter: Payer: Self-pay | Admitting: Adult Health

## 2019-04-13 ENCOUNTER — Ambulatory Visit (INDEPENDENT_AMBULATORY_CARE_PROVIDER_SITE_OTHER): Payer: BC Managed Care – PPO | Admitting: Adult Health

## 2019-04-13 ENCOUNTER — Other Ambulatory Visit (HOSPITAL_COMMUNITY)
Admission: RE | Admit: 2019-04-13 | Discharge: 2019-04-13 | Disposition: A | Discharge status: Home / Self Care | Payer: BC Managed Care – PPO | Source: Ambulatory Visit | Attending: Adult Health | Admitting: Adult Health

## 2019-04-13 ENCOUNTER — Other Ambulatory Visit: Payer: Self-pay

## 2019-04-13 VITALS — BP 123/87 | HR 82 | Ht 64.0 in | Wt 192.4 lb

## 2019-04-13 DIAGNOSIS — Z1211 Encounter for screening for malignant neoplasm of colon: Secondary | ICD-10-CM | POA: Diagnosis not present

## 2019-04-13 DIAGNOSIS — N952 Postmenopausal atrophic vaginitis: Secondary | ICD-10-CM | POA: Insufficient documentation

## 2019-04-13 DIAGNOSIS — Z01419 Encounter for gynecological examination (general) (routine) without abnormal findings: Secondary | ICD-10-CM | POA: Insufficient documentation

## 2019-04-13 DIAGNOSIS — Z1212 Encounter for screening for malignant neoplasm of rectum: Secondary | ICD-10-CM | POA: Diagnosis not present

## 2019-04-13 LAB — HEMOCCULT GUIAC POC 1CARD (OFFICE): Fecal Occult Blood, POC: NEGATIVE

## 2019-04-13 NOTE — Addendum Note (Signed)
Addended by: Diona Fanti A on: 04/13/2019 12:41 PM   Modules accepted: Orders

## 2019-04-13 NOTE — Progress Notes (Signed)
Patient ID: Erica Lynch, female   DOB: 12-08-1960, 58 y.o.   MRN: [id number] History of Present Illness: Erica Lynch is a 58 year old white female, married, PM in for a well woman gyn exam and pap. She is still working. PCP is Dr Willey Blade.   Current Medications, Allergies, Past Medical History, Past Surgical History, Family History and Social History were reviewed in [employer] record.     Review of Systems:  Patient denies any headaches, hearing loss, fatigue, blurred vision, shortness of breath, chest pain, abdominal pain, problems with bowel movements, urination, or intercourse(pain with sex). No joint pain or mood swings. She stopped HRT as hot flashes much better.    Physical Exam:BP 123/87 (BP Location: Left Arm, Patient Position: Sitting, Cuff Size: Normal)   Pulse 82   Ht 5\' 4"  (1.626 m)   Wt 192 lb 6.4 oz (87.3 kg)   BMI 33.03 kg/m  General:  Well developed, well nourished, no acute distress Skin:  Warm and dry Neck:  Midline trachea, normal thyroid, good ROM, no lymphadenopathy Lungs; Clear to auscultation bilaterally Breast:  No dominant palpable mass, retraction, or nipple discharge,has regular bilateral regular irregularities  Cardiovascular: Regular rate and rhythm Abdomen:  Soft, non tender, no hepatosplenomegaly Pelvic:  External genitalia is normal in appearance, no lesions,but has several small lite brown moles near introitus..  The vagina is pale with loss of moisture and rugae. Urethra has no lesions or masses. The cervix is smooth, pap with HPV genotype 16/18 performed.  Uterus is felt to be normal size, shape, and contour.  No adnexal masses or tenderness noted.Bladder is non tender, no masses felt. Rectal: Good sphincter tone, no polyps, or hemorrhoids felt.  Hemoccult negative. Extremities/musculoskeletal:  No swelling or varicosities noted, no clubbing or cyanosis Psych:  No mood changes, alert and cooperative,seems happy Fall risk is  low. PHQ 9 score is 5, in on klonopin. Examination chaperoned by Diona Fanti CMA  Impression: 1. Encounter for gynecological examination with Papanicolaou smear of cervix   2. Screening for colorectal cancer   3. Vaginal atrophy       Plan: Continue estrace cream has refills Good use lubricate with sex and increased foreplay Mammogram yearly Physical in 1 year Pap in 3 if normal Labs with PCP

## 2019-04-13 NOTE — Addendum Note (Signed)
Addended by: Diona Fanti A on: 04/13/2019 12:37 PM   Modules accepted: Orders

## 2019-04-16 DIAGNOSIS — F419 Anxiety disorder, unspecified: Secondary | ICD-10-CM | POA: Diagnosis not present

## 2019-04-16 DIAGNOSIS — N951 Menopausal and female climacteric states: Secondary | ICD-10-CM | POA: Diagnosis not present

## 2019-04-18 LAB — CYTOLOGY - PAP
Diagnosis: NEGATIVE
HPV: NOT DETECTED

## 2019-05-03 ENCOUNTER — Other Ambulatory Visit: Payer: Self-pay

## 2019-05-03 DIAGNOSIS — Z20822 Contact with and (suspected) exposure to covid-19: Secondary | ICD-10-CM

## 2019-05-04 LAB — NOVEL CORONAVIRUS, NAA: SARS-CoV-2, NAA: NOT DETECTED

## 2019-05-14 DIAGNOSIS — F419 Anxiety disorder, unspecified: Secondary | ICD-10-CM | POA: Diagnosis not present

## 2019-05-14 DIAGNOSIS — N951 Menopausal and female climacteric states: Secondary | ICD-10-CM | POA: Diagnosis not present

## 2019-05-30 ENCOUNTER — Other Ambulatory Visit: Payer: Self-pay | Admitting: Physician Assistant

## 2019-05-30 DIAGNOSIS — D485 Neoplasm of uncertain behavior of skin: Secondary | ICD-10-CM | POA: Diagnosis not present

## 2019-06-06 ENCOUNTER — Other Ambulatory Visit (HOSPITAL_COMMUNITY): Payer: Self-pay | Admitting: Obstetrics and Gynecology

## 2019-06-06 DIAGNOSIS — Z1231 Encounter for screening mammogram for malignant neoplasm of breast: Secondary | ICD-10-CM

## 2019-06-18 ENCOUNTER — Ambulatory Visit (HOSPITAL_COMMUNITY): Payer: BC Managed Care – PPO

## 2019-06-21 ENCOUNTER — Ambulatory Visit (HOSPITAL_COMMUNITY)
Admission: RE | Admit: 2019-06-21 | Discharge: 2019-06-21 | Disposition: A | Discharge status: Home / Self Care | Payer: BC Managed Care – PPO | Source: Ambulatory Visit | Attending: Obstetrics and Gynecology | Admitting: Obstetrics and Gynecology

## 2019-06-21 ENCOUNTER — Other Ambulatory Visit: Payer: Self-pay

## 2019-06-21 DIAGNOSIS — Z1231 Encounter for screening mammogram for malignant neoplasm of breast: Secondary | ICD-10-CM | POA: Diagnosis not present

## 2019-07-19 DIAGNOSIS — F419 Anxiety disorder, unspecified: Secondary | ICD-10-CM | POA: Diagnosis not present

## 2019-08-30 DIAGNOSIS — H02889 Meibomian gland dysfunction of unspecified eye, unspecified eyelid: Secondary | ICD-10-CM | POA: Diagnosis not present

## 2019-10-19 DIAGNOSIS — F419 Anxiety disorder, unspecified: Secondary | ICD-10-CM | POA: Diagnosis not present

## 2019-10-19 DIAGNOSIS — R030 Elevated blood-pressure reading, without diagnosis of hypertension: Secondary | ICD-10-CM | POA: Diagnosis not present

## 2019-12-10 ENCOUNTER — Encounter: Payer: Self-pay | Admitting: Internal Medicine

## 2020-01-01 ENCOUNTER — Other Ambulatory Visit: Payer: Self-pay

## 2020-01-01 ENCOUNTER — Ambulatory Visit: Payer: BC Managed Care – PPO | Admitting: Physician Assistant

## 2020-01-01 ENCOUNTER — Encounter: Payer: Self-pay | Admitting: Physician Assistant

## 2020-01-01 DIAGNOSIS — L570 Actinic keratosis: Secondary | ICD-10-CM

## 2020-01-01 DIAGNOSIS — Z1283 Encounter for screening for malignant neoplasm of skin: Secondary | ICD-10-CM

## 2020-01-01 NOTE — Progress Notes (Signed)
   Follow-Up Visit   Subjective  Erica Lynch is a 59 y.o. female who presents for the following: Follow-up (Patient here today for 6 month follow up for spots in vaginal area. Patient states the cream she was given does help but once she stops using it the spots come back.).  Location: labia minora  Duration: years Quality: brown spots Associated Signs/Symptoms: none Modifying Factors: persistent Severity: stable    The following portions of the chart were reviewed this encounter and updated as appropriate: Tobacco  Allergies  Meds  Problems  Med Hx  Surg Hx  Fam Hx      Objective  Well appearing patient in no apparent distress; mood and affect are within normal limits.  A full examination was performed including scalp, head, eyes, ears, nose, lips, neck, chest, axillae, abdomen, back, buttocks, bilateral upper extremities, bilateral lower extremities, hands, feet, fingers, toes, fingernails, and toenails. All findings within normal limits unless otherwise noted below.  Objective  Left Upper Cutaneous Lip: Erythematous patches with gritty scale.  Objective  head to toe: No DN   Assessment & Plan  AK (actinic keratosis) Left Upper Cutaneous Lip  Destruction of lesion - Left Upper Cutaneous Lip Complexity: simple   Destruction method: cryotherapy   Informed consent: discussed and consent obtained   Timeout:  patient name, date of birth, surgical site, and procedure verified Lesion destroyed using liquid nitrogen: Yes   Cryotherapy cycles:  1 Outcome: patient tolerated procedure well with no complications   Post-procedure details: wound care instructions given    Screening exam for skin cancer head to toe  Yearly skin exams

## 2020-01-14 DIAGNOSIS — F329 Major depressive disorder, single episode, unspecified: Secondary | ICD-10-CM | POA: Diagnosis not present

## 2020-01-14 DIAGNOSIS — Z79899 Other long term (current) drug therapy: Secondary | ICD-10-CM | POA: Diagnosis not present

## 2020-01-21 DIAGNOSIS — F419 Anxiety disorder, unspecified: Secondary | ICD-10-CM | POA: Diagnosis not present

## 2020-03-27 DIAGNOSIS — Z6834 Body mass index (BMI) 34.0-34.9, adult: Secondary | ICD-10-CM | POA: Diagnosis not present

## 2020-03-27 DIAGNOSIS — F419 Anxiety disorder, unspecified: Secondary | ICD-10-CM | POA: Diagnosis not present

## 2020-03-27 DIAGNOSIS — G5603 Carpal tunnel syndrome, bilateral upper limbs: Secondary | ICD-10-CM | POA: Diagnosis not present

## 2020-04-08 ENCOUNTER — Other Ambulatory Visit: Payer: Self-pay | Admitting: Adult Health

## 2020-04-09 DIAGNOSIS — R200 Anesthesia of skin: Secondary | ICD-10-CM | POA: Diagnosis not present

## 2020-05-16 ENCOUNTER — Ambulatory Visit: Payer: BC Managed Care – PPO | Admitting: Adult Health

## 2020-05-16 ENCOUNTER — Encounter: Payer: Self-pay | Admitting: Adult Health

## 2020-05-16 ENCOUNTER — Other Ambulatory Visit: Payer: Self-pay

## 2020-05-16 VITALS — BP 116/73 | HR 90 | Ht 64.0 in | Wt 202.0 lb

## 2020-05-16 DIAGNOSIS — N644 Mastodynia: Secondary | ICD-10-CM

## 2020-05-16 NOTE — Progress Notes (Signed)
°  Subjective:     Patient ID: Erica Lynch, female   DOB: 05-25-1961, 59 y.o.   MRN: [id number]  HPI Erica Lynch is a 59 year old white female, married, PM in complaining of pain in left breast that radiates to left under arm. She has had cysts in the past.  PCP is Dr Willey Blade  Review of Systems + pain in left breast that radiates to left under arm  Denies feeling a mass Reviewed past medical,surgical, social and family history. Reviewed medications and allergies.     Objective:   Physical Exam BP 116/73 (BP Location: Left Arm, Patient Position: Sitting, Cuff Size: Large)    Pulse 90    Ht 5\' 4"  (1.626 m)    Wt 202 lb (91.6 kg)    BMI 34.67 kg/m .  Skin warm and dry,  Breasts:no dominate palpable mass, retraction or nipple discharge, but pain left breast      Assessment:     1. Breast pain Will get diagnostic bilateral mammogram and [place], scheduled for 9/7 at 11 am at [place]:     Return 10/6 for pap and physical

## 2020-05-21 ENCOUNTER — Encounter: Payer: Self-pay | Admitting: Neurology

## 2020-05-21 DIAGNOSIS — R200 Anesthesia of skin: Secondary | ICD-10-CM | POA: Diagnosis not present

## 2020-05-26 ENCOUNTER — Other Ambulatory Visit: Payer: Self-pay

## 2020-05-26 DIAGNOSIS — R202 Paresthesia of skin: Secondary | ICD-10-CM

## 2020-05-30 DIAGNOSIS — G5600 Carpal tunnel syndrome, unspecified upper limb: Secondary | ICD-10-CM | POA: Diagnosis not present

## 2020-05-30 DIAGNOSIS — F419 Anxiety disorder, unspecified: Secondary | ICD-10-CM | POA: Diagnosis not present

## 2020-06-03 ENCOUNTER — Encounter (HOSPITAL_COMMUNITY): Payer: Self-pay

## 2020-06-03 ENCOUNTER — Other Ambulatory Visit: Payer: Self-pay

## 2020-06-03 ENCOUNTER — Ambulatory Visit (HOSPITAL_COMMUNITY)
Admission: RE | Admit: 2020-06-03 | Discharge: 2020-06-03 | Disposition: A | Discharge status: Home / Self Care | Payer: BC Managed Care – PPO | Source: Ambulatory Visit | Attending: Adult Health | Admitting: Adult Health

## 2020-06-03 DIAGNOSIS — N644 Mastodynia: Secondary | ICD-10-CM | POA: Diagnosis not present

## 2020-06-03 DIAGNOSIS — R922 Inconclusive mammogram: Secondary | ICD-10-CM | POA: Diagnosis not present

## 2020-06-04 ENCOUNTER — Ambulatory Visit (INDEPENDENT_AMBULATORY_CARE_PROVIDER_SITE_OTHER): Payer: BC Managed Care – PPO | Admitting: Neurology

## 2020-06-04 DIAGNOSIS — R202 Paresthesia of skin: Secondary | ICD-10-CM

## 2020-06-04 NOTE — Procedures (Signed)
[facility]  [place], [place]  [place], [place] [address] Tel: [phone] Fax:  [phone] Test Date:  06/04/2020  Patient: Erica Lynch DOB: 07-15-1961 Physician: Narda Amber, DO  Sex: Female Height: 5\' 4"  Ref Phys: Daryll Brod, MD  ID#: [id number] Temp: 33.0C Technician:    Patient Complaints: This is a 59 year old female referred for evaluation of left hand paresthesias and bilateral hand pain.  NCV & EMG Findings: Extensive electrodiagnostic testing of the right upper extremity and additional studies of the left shows: 1. Bilateral median, ulnar, and mixed palmar sensory responses are within normal limits. 2. Bilateral median and ulnar motor responses are within normal limits. 3. There is no evidence of active or chronic motor axonal loss changes affecting any of the tested muscles.  Motor unit configuration and recruitment pattern is within normal limits.  Impression: This is a normal study of the upper extremities.  In particular, there is no evidence of carpal tunnel syndrome or a left cervical radiculopathy.   ___________________________ Narda Amber, DO    Nerve Conduction Studies Anti Sensory Summary Table   Stim Site NR Peak (ms) Norm Peak (ms) P-T Amp (V) Norm P-T Amp  Left Median Anti Sensory (2nd Digit)  33C  Wrist    3.0 <3.8 52.5 >10  Right Median Anti Sensory (2nd Digit)  33C  Wrist    3.2 <3.8 51.5 >10  Left Ulnar Anti Sensory (5th Digit)  33C  Wrist    3.1 <3.2 26.7 >5  Right Ulnar Anti Sensory (5th Digit)  33C  Wrist    2.8 <3.2 32.3 >5   Motor Summary Table   Stim Site NR Onset (ms) Norm Onset (ms) O-P Amp (mV) Norm O-P Amp Site1 Site2 Delta-0 (ms) Dist (cm) Vel (m/s) Norm Vel (m/s)  Left Median Motor (Abd Poll Brev)  33C  Wrist    2.7 <4.0 13.3 >5 Elbow Wrist 4.4 27.0 61 >50  Elbow    7.1  13.2         Right Median Motor (Abd Poll Brev)  33C  Wrist    3.0 <4.0 13.1 >5 Elbow Wrist 4.5 28.0 62 >50  Elbow    7.5  12.2          Left Ulnar Motor (Abd Dig Minimi)  33C  Wrist    2.7 <3.1 10.3 >7 B Elbow Wrist 3.4 22.0 65 >50  B Elbow    6.1  9.9  A Elbow B Elbow 1.6 10.0 62 >50  A Elbow    7.7  8.9         Right Ulnar Motor (Abd Dig Minimi)  33C  Wrist    2.1 <3.1 11.0 >7 B Elbow Wrist 3.4 22.0 65 >50  B Elbow    5.5  10.6  A Elbow B Elbow 1.6 10.0 63 >50  A Elbow    7.1  10.6          Comparison Summary Table   Stim Site NR Peak (ms) Norm Peak (ms) P-T Amp (V) Site1 Site2 Delta-P (ms) Norm Delta (ms)  Left Median/Ulnar Palm Comparison (Wrist - 8cm)  33C  Median Palm    1.5 <2.2 40.9 Median Palm Ulnar Palm 0.2   Ulnar Palm    1.7 <2.2 22.2      Right Median/Ulnar Palm Comparison (Wrist - 8cm)  33C  Median Palm    1.7 <2.2 49.2 Median Palm Ulnar Palm 0.1   Ulnar [employer]  1.6 <2.2 19.2       EMG   Side Muscle Ins Act Fibs Psw Fasc Number Recrt Dur Dur. Amp Amp. Poly Poly. Comment  Left 1stDorInt Nml Nml Nml Nml Nml Nml Nml Nml Nml Nml Nml Nml N/A  Left PronatorTeres Nml Nml Nml Nml Nml Nml Nml Nml Nml Nml Nml Nml N/A  Left Biceps Nml Nml Nml Nml Nml Nml Nml Nml Nml Nml Nml Nml N/A  Left Triceps Nml Nml Nml Nml Nml Nml Nml Nml Nml Nml Nml Nml N/A  Left Deltoid Nml Nml Nml Nml Nml Nml Nml Nml Nml Nml Nml Nml N/A      Waveforms:

## 2020-06-18 ENCOUNTER — Encounter: Payer: BC Managed Care – PPO | Admitting: Neurology

## 2020-06-23 ENCOUNTER — Other Ambulatory Visit: Payer: Self-pay

## 2020-06-23 DIAGNOSIS — R200 Anesthesia of skin: Secondary | ICD-10-CM | POA: Diagnosis not present

## 2020-06-23 DIAGNOSIS — M19042 Primary osteoarthritis, left hand: Secondary | ICD-10-CM | POA: Diagnosis not present

## 2020-06-23 DIAGNOSIS — M180 Bilateral primary osteoarthritis of first carpometacarpal joints: Secondary | ICD-10-CM | POA: Diagnosis not present

## 2020-06-23 MED ORDER — ESTRADIOL 0.1 MG/GM VA CREA: TOPICAL_CREAM | VAGINAL | 0 refills | Status: DC

## 2020-07-02 ENCOUNTER — Other Ambulatory Visit: Payer: Self-pay

## 2020-07-02 ENCOUNTER — Encounter: Payer: Self-pay | Admitting: Adult Health

## 2020-07-02 ENCOUNTER — Ambulatory Visit (INDEPENDENT_AMBULATORY_CARE_PROVIDER_SITE_OTHER): Payer: BC Managed Care – PPO | Admitting: Adult Health

## 2020-07-02 VITALS — BP 121/80 | HR 83 | Ht 64.0 in | Wt 202.0 lb

## 2020-07-02 DIAGNOSIS — Z01419 Encounter for gynecological examination (general) (routine) without abnormal findings: Secondary | ICD-10-CM | POA: Diagnosis not present

## 2020-07-02 DIAGNOSIS — Z1211 Encounter for screening for malignant neoplasm of colon: Secondary | ICD-10-CM | POA: Diagnosis not present

## 2020-07-02 LAB — HEMOCCULT GUIAC POC 1CARD (OFFICE): Fecal Occult Blood, POC: NEGATIVE

## 2020-07-02 MED ORDER — ESTRADIOL 0.1 MG/GM VA CREA: TOPICAL_CREAM | VAGINAL | 2 refills | Status: DC

## 2020-07-02 NOTE — Progress Notes (Signed)
Patient ID: Erica Lynch, female   DOB: 19-Jan-1961, 59 y.o.   MRN: [id number] History of Present Illness:  Erica Lynch is a 59 year old white female,married, PM in for a well woman gyn exam, she had a normal pap 04/13/2019. PCP is Dr Willey Blade.   Current Medications, Allergies, Past Medical History, Past Surgical History, Family History and Social History were reviewed in [employer] record.     Review of Systems: Patient denies any headaches, hearing loss, fatigue, blurred vision, shortness of breath, chest pain, abdominal pain, problems with bowel movements, urination(dribbles at times), or intercourse(not active). No joint pain or mood swings.    Physical Exam:BP 121/80 (BP Location: Left Arm, Patient Position: Sitting, Cuff Size: Normal)   Pulse 83   Ht 5\' 4"  (1.626 m)   Wt 202 lb (91.6 kg)   BMI 34.67 kg/m  General:  Well developed, well nourished, no acute distress Skin:  Warm and dry Neck:  Midline trachea, normal thyroid, good ROM, no lymphadenopathy,no carotid bruits heard Lungs; Clear to auscultation bilaterally Breast:  No dominant palpable mass, retraction, or nipple discharge Cardiovascular: Regular rate and rhythm Abdomen:  Soft, non tender, no hepatosplenomegaly Pelvic:  External genitalia is normal in appearance, no lesions.  The vagina is pink with with loss of moisture and rugae. Urethra has no lesions or masses. The cervix is smooth.  Uterus is felt to be normal size, shape, and contour.  No adnexal masses or tenderness noted.Bladder is non tender, no masses felt. Rectal: Good sphincter tone, no polyps, or hemorrhoids felt.  Hemoccult negative. Extremities/musculoskeletal:  No swelling or varicosities noted, no clubbing or cyanosis Psych:  No mood changes, alert and cooperative,seems happy AA is 1 Fall risk is low PHQ 9 score is 4, no SI, is on Wellbutrin   Upstream - 07/02/20 0836      Pregnancy Intention Screening   Does the patient want to  become pregnant in the next year? N/A   ablation   Does the patient's partner want to become pregnant in the next year? N/A    Would the patient like to discuss contraceptive options today? N/A      Contraception Wrap Up   Current Method No Method - Other Reason   PM   End Method No Method - Other Reason   PM   Contraception Counseling Provided No   ablation         Examination chaperoned by Dwyane Dee LPN  Impression and Plan: Physical in 1 year Pap in 2023 Mammogram yearly Labs with PCP Colonoscopy per GI  Continue estrace vaginal cream  Meds ordered this encounter  Medications  . estradiol (ESTRACE) 0.1 MG/GM vaginal cream    Sig: USE 1 GRAM IN VAGINA TWICE WEEKLY.    Dispense:  42.5 g    Refill:  2    Order Specific Question:   Supervising Provider    Answer:   Tania Ade H [[id number]]

## 2020-07-03 ENCOUNTER — Ambulatory Visit: Payer: BC Managed Care – PPO | Admitting: Physician Assistant

## 2020-08-04 DIAGNOSIS — M180 Bilateral primary osteoarthritis of first carpometacarpal joints: Secondary | ICD-10-CM | POA: Diagnosis not present

## 2020-08-28 DIAGNOSIS — R519 Headache, unspecified: Secondary | ICD-10-CM | POA: Diagnosis not present

## 2020-08-28 DIAGNOSIS — H52223 Regular astigmatism, bilateral: Secondary | ICD-10-CM | POA: Diagnosis not present

## 2020-08-28 DIAGNOSIS — H524 Presbyopia: Secondary | ICD-10-CM | POA: Diagnosis not present

## 2020-09-02 DIAGNOSIS — G4701 Insomnia due to medical condition: Secondary | ICD-10-CM | POA: Diagnosis not present

## 2020-09-06 ENCOUNTER — Other Ambulatory Visit: Payer: Self-pay

## 2020-09-06 ENCOUNTER — Ambulatory Visit
Admission: EM | Admit: 2020-09-06 | Discharge: 2020-09-06 | Disposition: A | Discharge status: Home / Self Care | Payer: BC Managed Care – PPO | Attending: Internal Medicine | Admitting: Internal Medicine

## 2020-09-06 ENCOUNTER — Ambulatory Visit: Admit: 2020-09-06 | Discharge status: Absent without leave | Payer: BC Managed Care – PPO

## 2020-09-06 DIAGNOSIS — R109 Unspecified abdominal pain: Secondary | ICD-10-CM | POA: Diagnosis not present

## 2020-09-06 LAB — POCT URINALYSIS DIP (MANUAL ENTRY)
Bilirubin, UA: NEGATIVE
Glucose, UA: NEGATIVE mg/dL
Ketones, POC UA: NEGATIVE mg/dL
Leukocytes, UA: NEGATIVE
Nitrite, UA: NEGATIVE
Protein Ur, POC: NEGATIVE mg/dL
Spec Grav, UA: 1.025 (ref 1.010–1.025)
Urobilinogen, UA: 0.2 E.U./dL
pH, UA: 6 (ref 5.0–8.0)

## 2020-09-06 NOTE — ED Triage Notes (Signed)
Pt presents with frequency and urgency that began yesterday

## 2020-09-06 NOTE — ED Provider Notes (Signed)
[facility]    CSN: [id number] Arrival date & time: 09/06/20  1050      History   Chief Complaint Chief Complaint  Patient presents with  . Dysuria    HPI Erica Lynch is a 59 y.o. female comes to the urgent care today with complaints of lower abdominal discomforts, frequency and some urgency that began yesterday.  Patient denies any flank pain or fever.  She had 2 episodes of diarrhea bowel movements yesterday morning.  She denies any unusual diet.  No recent travel.  No nausea or vomiting.  Following the diarrheal bowel movements patient started experiencing the lower abdominal pressure with the urgency/frequency symptoms.   HPI  Past Medical History:  Diagnosis Date  . Anxiety   . Atypical mole 04/03/1996   moderate atypia - upper anterior left thigh (no treatment)  . Atypical mole 09/11/1996   slight to moderate atypia - left buttocks (no treatment)  . Atypical mole 11/19/1996   slight atypia - mid left paraspinal (no treatment)  . Atypical mole 04/04/2001   slight atypia - right scapula (no treatment)  . Atypical mole 12/22/2001   mild atypia - right mid calf (no treatment)  . Atypical mole 12/22/2001   moderate atypia - right anterior neck (widershave)  . Atypical mole 11/23/2007   moderate atypia - right upper back (widershave)  . Atypical mole 07/21/2011   mild atypia - right upper inner thigh  . Atypical mole 07/21/2011   mild atypia - right calf  . Atypical mole 02/04/2016   moderate atypia - left calf (widershave)  . Depression   . Fatigue 12/04/2015  . GERD (gastroesophageal reflux disease)   . Hematuria 05/22/2014  . History of breast lump 02/19/2014  . Hot flashes 12/04/2015  . Hyperlipidemia   . IBS (irritable bowel syndrome)   . Left breast mass 11/07/2013  . LLQ pain 05/22/2014  . Lumbar herniated disc November 2016  . Other and unspecified ovarian cyst 05/23/2014  . Squamous cell carcinoma of skin 12/22/2001   right inner cheek - CX3  + excision (01/26/2002)  . Squamous cell carcinoma of skin 12/22/2001   left outer cheek - CX (02/23/2002)  . Squamous cell carcinoma of skin 07/13/2017   right chest - tx p bx  . Unspecified symptom associated with female genital organs 05/22/2014  . Vitamin D deficiency 12/05/2015    Patient Active Problem List   Diagnosis Date Noted  . Encounter for screening fecal occult blood testing 07/02/2020  . Encounter for well woman exam with routine gynecological exam 07/02/2020  . Screening for colorectal cancer 04/13/2019  . Encounter for gynecological examination with Papanicolaou smear of cervix 04/13/2019  . Vaginal atrophy 04/13/2019  . Hemorrhoids 12/06/2018  . GERD (gastroesophageal reflux disease) 11/15/2017  . Vaginal dryness 10/20/2016  . Depression 10/20/2016  . Vitamin D deficiency 12/05/2015  . Hot flashes 12/04/2015  . Fatigue 12/04/2015  . Major depression 06/17/2015  . Breast calcifications on mammogram 11/27/2014  . Back pain with right-sided radiculopathy 10/08/2014  . 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 Other and unspecified ovarian cyst 05/23/2014  . Unspecified symptom associated with female genital organs 05/22/2014  . Hematuria 05/22/2014  . LLQ pain 05/22/2014  . History of breast lump 02/19/2014  . Left breast mass 11/07/2013  . Screen for colon cancer 08/04/2011    Past Surgical History:  Procedure Laterality Date  . CESAREAN SECTION  1996  . COLONOSCOPY  08/2011   Dr. Gala Romney: normal,  next colonoscopy December 2022.  . ENDOMETRIAL ABLATION  2-3 yrs ago   [facility]  . ESOPHAGOGASTRODUODENOSCOPY (EGD) WITH PROPOFOL N/A 11/21/2017   Dr. Gala Romney: Normal esophagus, status post dilation, small hiatal hernia  . MALONEY DILATION N/A 11/21/2017   Procedure: Venia Minks DILATION;  Surgeon: Daneil Dolin, MD;  [facility];  Service: Endoscopy;  Laterality: N/A;  . OTHER SURGICAL HISTORY     Uterine ablation  . TUBAL LIGATION  6 yrs ago   [facility]     OB History    Gravida  1   Para  1   Term  1   Preterm      AB      Living  1     SAB      IAB      Ectopic      Multiple      Live Births  1            Home Medications    Prior to Admission medications   Medication Sig Start Date End Date Taking? Authorizing Provider  buPROPion (WELLBUTRIN XL) 150 MG 24 hr tablet Take by mouth. 12/27/19   [provider]  clonazePAM (KLONOPIN) 1 MG tablet Take 1 tablet (1 mg total) by mouth 2 (two) times daily as needed for anxiety. 11/14/17   Cloria Spring, MD  estradiol (ESTRACE) 0.1 MG/GM vaginal cream USE 1 GRAM IN VAGINA TWICE WEEKLY. 07/02/20   Estill Dooms, NP    Family History Family History  Problem Relation Age of Onset  . Lung cancer Mother        deceased from lung cancer at age 70  . Breast cancer Mother   . Cancer Mother        breast at age 33  . Depression Mother   . Cirrhosis Father        etoh   . Anxiety disorder Father   . Alcohol abuse Father   . Depression Father   . Diabetes Maternal Grandfather   . Heart disease Maternal Grandfather   . Depression Maternal Grandmother   . Depression Maternal Uncle   . Depression Maternal Uncle   . Anesthesia problems Neg Hx   . Hypotension Neg Hx   . Malignant hyperthermia Neg Hx   . Pseudochol deficiency Neg Hx   . Colon cancer Neg Hx   . Colon polyps Neg Hx     Social History Social History   Tobacco Use  . Smoking status: Never Smoker  . Smokeless tobacco: Never Used  Vaping Use  . Vaping Use: Never used  Substance Use Topics  . Alcohol use: Yes    Comment: rarely  . Drug use: No     Allergies   Patient has no known allergies.   Review of Systems Review of Systems  Constitutional: Negative.   HENT: Negative.   Genitourinary: Negative.      Physical Exam Triage Vital Signs ED Triage Vitals  Enc Vitals Group     BP 09/06/20 1102 135/84     Pulse Rate 09/06/20 1102 89     Resp 09/06/20 1102 16     Temp  09/06/20 1102 97.7 F (36.5 C)     Temp Source 09/06/20 1102 Tympanic     SpO2 09/06/20 1102 94 %     Weight --      Height --      Head Circumference --      Peak Flow --  Pain Score 09/06/20 1111 0     Pain Loc --      Pain Edu? --      Excl. in [place]? --    No data found.  Updated Vital Signs BP 135/84 (BP Location: Right Arm)   Pulse 89   Temp 97.7 F (36.5 C) (Tympanic)   Resp 16   SpO2 94%   Visual Acuity Right Eye Distance:   Left Eye Distance:   Bilateral Distance:    Right Eye Near:   Left Eye Near:    Bilateral Near:     Physical Exam Vitals and nursing note reviewed.  Constitutional:      General: She is not in acute distress.    Appearance: She is not ill-appearing.  Cardiovascular:     Rate and Rhythm: Normal rate and regular rhythm.     Pulses: Normal pulses.     Heart sounds: Normal heart sounds.  Abdominal:     General: Bowel sounds are normal. There is no distension.     Tenderness: There is no abdominal tenderness. There is no guarding or rebound.     Hernia: No hernia is present.  Skin:    Capillary Refill: Capillary refill takes less than 2 seconds.  Neurological:     General: No focal deficit present.     Mental Status: She is alert and oriented to person, place, and time.      UC Treatments / Results  Labs (all labs ordered are listed, but only abnormal results are displayed) Labs Reviewed  POCT URINALYSIS DIP (MANUAL ENTRY) - Abnormal; Notable for the following components:      Result Value   Blood, UA trace-intact (*)    All other components within normal limits  URINE CULTURE    EKG   Radiology No results found.  Procedures Procedures (including critical care time)  Medications Ordered in UC Medications - No data to display  Initial Impression / Assessment and Plan / UC Course  I have reviewed the triage vital signs and the nursing notes.  Pertinent labs & imaging results that were available during my care of  the patient were reviewed by me and considered in my medical decision making (see chart for details).     1.  Lower abdominal discomfort: Point-of-care urinalysis is negative for UTI I suspect the abdominal pressure and urinary concerns are related to the diarrheal episode patient had yesterday morning.  This may be mild colitis which has resolved.  Patient is encouraged to return to urgent care if symptoms worsen or she develops any fever or chills. Final Clinical Impressions(s) / UC Diagnoses   Final diagnoses:  Abdominal discomfort     Discharge Instructions     No abdominal discomfort you have any related to diarrheal episodes. I anticipate this will subside within the next 24 to 48 hours If you notice any worsening pain, bloating or abdominal distention or if the diarrhea recurs and has blood or mucus in it please return to the urgent care to be evaluated. You do not have a urinary tract infection.   ED Prescriptions    None     PDMP not reviewed this encounter.   Chase Picket, MD 09/06/20 1153

## 2020-09-06 NOTE — Discharge Instructions (Signed)
No abdominal discomfort you have any related to diarrheal episodes. I anticipate this will subside within the next 24 to 48 hours If you notice any worsening pain, bloating or abdominal distention or if the diarrhea recurs and has blood or mucus in it please return to the urgent care to be evaluated. You do not have a urinary tract infection.

## 2020-09-07 LAB — URINE CULTURE: Culture: NO GROWTH

## 2020-10-03 ENCOUNTER — Ambulatory Visit: Payer: BC Managed Care – PPO | Admitting: Physician Assistant

## 2020-10-29 DIAGNOSIS — Z20828 Contact with and (suspected) exposure to other viral communicable diseases: Secondary | ICD-10-CM | POA: Diagnosis not present

## 2020-11-03 DIAGNOSIS — F329 Major depressive disorder, single episode, unspecified: Secondary | ICD-10-CM | POA: Diagnosis not present

## 2020-11-03 DIAGNOSIS — U071 COVID-19: Secondary | ICD-10-CM | POA: Diagnosis not present

## 2020-12-17 ENCOUNTER — Other Ambulatory Visit: Payer: Self-pay

## 2020-12-17 ENCOUNTER — Ambulatory Visit: Payer: BC Managed Care – PPO | Admitting: Physician Assistant

## 2020-12-17 ENCOUNTER — Encounter: Payer: Self-pay | Admitting: Physician Assistant

## 2020-12-17 DIAGNOSIS — Z85828 Personal history of other malignant neoplasm of skin: Secondary | ICD-10-CM

## 2020-12-17 DIAGNOSIS — Z8589 Personal history of malignant neoplasm of other organs and systems: Secondary | ICD-10-CM

## 2020-12-17 DIAGNOSIS — L570 Actinic keratosis: Secondary | ICD-10-CM | POA: Diagnosis not present

## 2020-12-17 DIAGNOSIS — D485 Neoplasm of uncertain behavior of skin: Secondary | ICD-10-CM | POA: Diagnosis not present

## 2020-12-17 DIAGNOSIS — L814 Other melanin hyperpigmentation: Secondary | ICD-10-CM | POA: Diagnosis not present

## 2020-12-17 DIAGNOSIS — Z1283 Encounter for screening for malignant neoplasm of skin: Secondary | ICD-10-CM | POA: Diagnosis not present

## 2020-12-17 NOTE — Patient Instructions (Signed)

## 2020-12-17 NOTE — Progress Notes (Addendum)
   Follow-Up Visit   Subjective  Erica Lynch is a 60 y.o. female who presents for the following: Annual Exam (Right great toe- thinks its a bruise).   The following portions of the chart were reviewed this encounter and updated as appropriate:  Tobacco  Allergies  Meds  Problems  Med Hx  Surg Hx  Fam Hx      Objective  Well appearing patient in no apparent distress; mood and affect are within normal limits.  A full examination was performed including scalp, head, eyes, ears, nose, lips, neck, chest, axillae, abdomen, back, buttocks, bilateral upper extremities, bilateral lower extremities, hands, feet, fingers, toes, fingernails, and toenails. All findings within normal limits unless otherwise noted below.  Objective  head to toe: No signs of non-mole skin cancer.   Objective  Left Outer Cheek, Right Inner Cheek: Dyspigmented scar.   Objective  Chest - [facility]): Diffuse pink scale  Objective  Left Labium Majus: Dark macule       Assessment & Plan  Skin exam for malignant neoplasm head to toe  Yearly skin check  History of squamous cell carcinoma (2) Left Outer Cheek; Right Inner Cheek  observe  AK (actinic keratosis) Chest - Medial (Center)  Treat chest with 5FU in the Fall  Destruction of lesion - Chest - Medial (Center) Complexity: simple   Destruction method: cryotherapy   Informed consent: discussed and consent obtained   Timeout:  patient name, date of birth, surgical site, and procedure verified Lesion destroyed using liquid nitrogen: Yes   Cryotherapy cycles:  1 Outcome: patient tolerated procedure well with no complications   Post-procedure details: wound care instructions given    Neoplasm of uncertain behavior of skin Left Labium Majus  Skin / nail biopsy Type of biopsy: tangential   Informed consent: discussed and consent obtained   Timeout: patient name, date of birth, surgical site, and procedure verified   Procedure  prep:  Patient was prepped and draped in usual sterile fashion (Non sterile) Prep type:  Chlorhexidine Anesthesia: the lesion was anesthetized in a standard fashion   Anesthetic:  1% lidocaine w/ epinephrine 1-100,000 local infiltration Instrument used: flexible razor blade   Hemostasis achieved with: aluminum chloride   Outcome: patient tolerated procedure well   Post-procedure details: wound care instructions given    Specimen 1 - Surgical pathology Differential Diagnosis: R/O Atypia  Check Margins: No   I, Lakeyn Dokken, PA-C, have reviewed all documentation's for this visit.  The documentation on 12/24/20 for the exam, diagnosis, procedures and orders are all accurate and complete.

## 2020-12-24 ENCOUNTER — Telehealth: Payer: Self-pay | Admitting: Physician Assistant

## 2020-12-24 NOTE — Telephone Encounter (Signed)
Patient calling for pathology results informed patient results not back yet.

## 2020-12-24 NOTE — Telephone Encounter (Signed)
Results, KRS 

## 2020-12-25 ENCOUNTER — Telehealth: Payer: Self-pay

## 2020-12-25 NOTE — Telephone Encounter (Signed)
Path to patient  

## 2020-12-25 NOTE — Telephone Encounter (Signed)
-----   Message from Warren Danes, [place] sent at 12/25/2020  1:30 PM EDT ----- Freckle... observe others

## 2021-01-06 DIAGNOSIS — F320 Major depressive disorder, single episode, mild: Secondary | ICD-10-CM | POA: Diagnosis not present

## 2021-01-06 DIAGNOSIS — F419 Anxiety disorder, unspecified: Secondary | ICD-10-CM | POA: Diagnosis not present

## 2021-02-26 ENCOUNTER — Ambulatory Visit: Payer: BC Managed Care – PPO | Admitting: Gastroenterology

## 2021-03-24 ENCOUNTER — Ambulatory Visit: Payer: BC Managed Care – PPO | Admitting: Physician Assistant

## 2021-03-24 ENCOUNTER — Other Ambulatory Visit: Payer: Self-pay

## 2021-03-24 ENCOUNTER — Encounter: Payer: Self-pay | Admitting: Physician Assistant

## 2021-03-24 DIAGNOSIS — S90211A Contusion of right great toe with damage to nail, initial encounter: Secondary | ICD-10-CM

## 2021-03-24 DIAGNOSIS — T148XXA Other injury of unspecified body region, initial encounter: Secondary | ICD-10-CM

## 2021-03-24 NOTE — Progress Notes (Signed)
   Follow-Up Visit   Subjective  Erica Lynch is a 60 y.o. female who presents for the following: Follow-up (Patient here today to recheck dark spot on right great toenail. Per patient she does believe that spot is moving up. ).   The following portions of the chart were reviewed this encounter and updated as appropriate:  Tobacco  Allergies  Meds  Problems  Med Hx  Surg Hx  Fam Hx       Objective  Well appearing patient in no apparent distress; mood and affect are within normal limits.  A focused examination was performed including toenails. Relevant physical exam findings are noted in the Assessment and Plan.  Right Hallux Toe Nail Plate 0.8 cm small macule that is growing distally with time      Assessment & Plan  Hematoma Right Hallux Toe Nail Plate  Observe. Call if changes or grows.    I, Celeste Candelas, PA-C, have reviewed all documentation's for this visit.  The documentation on 03/24/21 for the exam, diagnosis, procedures and orders are all accurate and complete.

## 2021-03-26 ENCOUNTER — Encounter: Payer: Self-pay | Admitting: Internal Medicine

## 2021-04-09 DIAGNOSIS — F419 Anxiety disorder, unspecified: Secondary | ICD-10-CM | POA: Diagnosis not present

## 2021-04-09 DIAGNOSIS — G4452 New daily persistent headache (NDPH): Secondary | ICD-10-CM | POA: Diagnosis not present

## 2021-04-30 ENCOUNTER — Other Ambulatory Visit (HOSPITAL_COMMUNITY): Payer: Self-pay | Admitting: Internal Medicine

## 2021-04-30 ENCOUNTER — Other Ambulatory Visit (HOSPITAL_COMMUNITY): Payer: Self-pay | Admitting: Adult Health

## 2021-04-30 DIAGNOSIS — Z1231 Encounter for screening mammogram for malignant neoplasm of breast: Secondary | ICD-10-CM

## 2021-06-08 ENCOUNTER — Other Ambulatory Visit: Payer: Self-pay

## 2021-06-08 ENCOUNTER — Ambulatory Visit (HOSPITAL_COMMUNITY)
Admission: RE | Admit: 2021-06-08 | Discharge: 2021-06-08 | Disposition: A | Discharge status: Home / Self Care | Payer: BC Managed Care – PPO | Source: Ambulatory Visit | Attending: Adult Health | Admitting: Adult Health

## 2021-06-08 DIAGNOSIS — Z1231 Encounter for screening mammogram for malignant neoplasm of breast: Secondary | ICD-10-CM | POA: Insufficient documentation

## 2021-06-15 DIAGNOSIS — M546 Pain in thoracic spine: Secondary | ICD-10-CM | POA: Diagnosis not present

## 2021-06-15 DIAGNOSIS — R079 Chest pain, unspecified: Secondary | ICD-10-CM | POA: Diagnosis not present

## 2021-06-15 DIAGNOSIS — Z0000 Encounter for general adult medical examination without abnormal findings: Secondary | ICD-10-CM | POA: Diagnosis not present

## 2021-06-24 ENCOUNTER — Encounter: Payer: Self-pay | Admitting: Gastroenterology

## 2021-06-24 ENCOUNTER — Encounter: Payer: Self-pay | Admitting: Internal Medicine

## 2021-06-24 ENCOUNTER — Other Ambulatory Visit: Payer: Self-pay

## 2021-06-24 ENCOUNTER — Telehealth: Payer: Self-pay

## 2021-06-24 ENCOUNTER — Telehealth: Payer: Self-pay | Admitting: Gastroenterology

## 2021-06-24 ENCOUNTER — Ambulatory Visit: Payer: BC Managed Care – PPO | Admitting: Gastroenterology

## 2021-06-24 VITALS — BP 122/81 | HR 79 | Temp 97.5°F | Ht 64.0 in | Wt 202.2 lb

## 2021-06-24 DIAGNOSIS — K219 Gastro-esophageal reflux disease without esophagitis: Secondary | ICD-10-CM | POA: Diagnosis not present

## 2021-06-24 DIAGNOSIS — R1013 Epigastric pain: Secondary | ICD-10-CM | POA: Diagnosis not present

## 2021-06-24 DIAGNOSIS — Z1211 Encounter for screening for malignant neoplasm of colon: Secondary | ICD-10-CM | POA: Diagnosis not present

## 2021-06-24 MED ORDER — DEXLANSOPRAZOLE 60 MG PO CPDR: 60.0000 mg | DELAYED_RELEASE_CAPSULE | Freq: Every day | ORAL | 3 refills | Status: DC

## 2021-06-24 NOTE — Progress Notes (Signed)
Referring Provider: Asencion Noble, MD Primary Care Physician:  Asencion Noble, MD Primary GI: Dr. Gala Romney   Chief Complaint  Patient presents with   Gastroesophageal Reflux    Burning in chest when she eats. Taking pepcid OTC QD. Found dexilant capsules at home and took 10 of those and it did help more than the pepcid did.   Consult    Received letter in mail she is due for TCS/EGD    HPI:   Erica Lynch is a 60 y.o. female presenting today with a history of GERD, last colonoscopy in 2012 and due for routine screening now, presenting today for evaluation. Last seen in 2020.    Had pain radiating from back to epigastric several weeks ago and saw Dr. Willey Blade. EKG normal. Told to take Pepcid by PCP. Dexilant helped much more. Used samples from home. When eating, burning in epigastric region. No dysphagia. Vague pain in epigastric region. Symptoms present for several weeks. Has been on diclofenac BID for a weeks but started after pain started. No nausea or vomiting. No melena or hematochezia. No unexplained weight loss or lack of appetite.   Dexilant has helped the most in the past. Has also tried Protonix, Prilosec, and Nexium in the past.     Past Medical History:  Diagnosis Date   Anxiety    Atypical mole 04/03/1996   moderate atypia - upper anterior left thigh (no treatment)   Atypical mole 09/11/1996   slight to moderate atypia - left buttocks (no treatment)   Atypical mole 11/19/1996   slight atypia - mid left paraspinal (no treatment)   Atypical mole 04/04/2001   slight atypia - right scapula (no treatment)   Atypical mole 12/22/2001   mild atypia - right mid calf (no treatment)   Atypical mole 12/22/2001   moderate atypia - right anterior neck (widershave)   Atypical mole 11/23/2007   moderate atypia - right upper back (widershave)   Atypical mole 07/21/2011   mild atypia - right upper inner thigh   Atypical mole 07/21/2011   mild atypia - right calf   Atypical mole  02/04/2016   moderate atypia - left calf (widershave)   Depression    Fatigue 12/04/2015   GERD (gastroesophageal reflux disease)    Hematuria 05/22/2014   History of breast lump 02/19/2014   Hot flashes 12/04/2015   Hyperlipidemia    IBS (irritable bowel syndrome)    Left breast mass 11/07/2013   LLQ pain 05/22/2014   Lumbar herniated disc November 2016   Other and unspecified ovarian cyst 05/23/2014   Squamous cell carcinoma of skin 12/22/2001   right inner cheek - CX3 + excision (01/26/2002)   Squamous cell carcinoma of skin 12/22/2001   left outer cheek - CX (02/23/2002)   Squamous cell carcinoma of skin 07/13/2017   right chest - tx p bx   Unspecified symptom associated with female genital organs 05/22/2014   Vitamin D deficiency 12/05/2015    Past Surgical History:  Procedure Laterality Date   [place]   COLONOSCOPY  08/2011   Dr. Gala Romney: normal, next colonoscopy December 2022.   ENDOMETRIAL ABLATION  2-3 yrs ago   [facility]   ESOPHAGOGASTRODUODENOSCOPY (EGD) WITH PROPOFOL N/A 11/21/2017   Dr. Gala Romney: Normal esophagus, status post dilation, small hiatal hernia   MALONEY DILATION N/A 11/21/2017   Procedure: MALONEY DILATION;  Surgeon: Daneil Dolin, MD;  Location: [facility];  Service: Endoscopy;  Laterality: N/A;   OTHER  SURGICAL HISTORY     Uterine ablation   TUBAL LIGATION  6 yrs ago   [facility] Herring    Current Outpatient Medications  Medication Sig Dispense Refill   buPROPion (WELLBUTRIN XL) 150 MG 24 hr tablet Take by mouth.     clonazePAM (KLONOPIN) 1 MG tablet Take 1 tablet (1 mg total) by mouth 2 (two) times daily as needed for anxiety. 60 tablet 5   diclofenac (VOLTAREN) 75 MG EC tablet Take 75 mg by mouth 2 (two) times daily.     estradiol (ESTRACE) 0.1 MG/GM vaginal cream USE 1 GRAM IN VAGINA TWICE WEEKLY. 42.5 g 2   Famotidine (PEPCID PO) Take by mouth daily.     No current facility-administered medications for this visit.    Allergies as of  06/24/2021   (No Known Allergies)    Family History  Problem Relation Age of Onset   Lung cancer Mother        deceased from lung cancer at age 35   Breast cancer Mother    Cancer Mother        breast at age 36   Depression Mother    Cirrhosis Father        etoh    Anxiety disorder Father    Alcohol abuse Father    Depression Father    Diabetes Maternal Grandfather    Heart disease Maternal Grandfather    Depression Maternal Grandmother    Depression Maternal Uncle    Depression Maternal Uncle    Anesthesia problems Neg Hx    Hypotension Neg Hx    Malignant hyperthermia Neg Hx    Pseudochol deficiency Neg Hx    Colon cancer Neg Hx    Colon polyps Neg Hx     Social History   Socioeconomic History   Marital status: Married    Spouse name: Not on file   Number of children: 1   Years of education: Not on file   Highest education level: Not on file  Occupational History   Occupation: Artist: [employer]  Tobacco Use   Smoking status: Never   Smokeless tobacco: Never  Vaping Use   Vaping Use: Never used  Substance and Sexual Activity   Alcohol use: Yes    Comment: rarely   Drug use: No   Sexual activity: Not Currently    Birth control/protection: Surgical, Post-menopausal    Comment: tubal and ablation  Other Topics Concern   Not on file  Social History Narrative   1 son-16         Social Determinants of Health   Financial Resource Strain: Low Risk    Difficulty of Paying Living Expenses: Not very hard  Food Insecurity: No Food Insecurity   Worried About Charity fundraiser in the Last Year: Never true   Ran Out of Food in the Last Year: Never true  Transportation Needs: No Transportation Needs   Lack of Transportation (Medical): No   Lack of Transportation (Non-Medical): No  Physical Activity: Insufficiently Active   Days of Exercise per Week: 2 days   Minutes of Exercise per Session: 30 min  Stress: Stress Concern  Present   Feeling of Stress : Rather much  Social Connections: Moderately Integrated   Frequency of Communication with Friends and Family: More than three times a week   Frequency of Social Gatherings with Friends and Family: Once a week   Attends Religious Services: More than 4 times  per year   Active Member of Clubs or Organizations: No   Attends Archivist Meetings: Never   Marital Status: Married    Review of Systems: Gen: Denies fever, chills, anorexia. Denies fatigue, weakness, weight loss.  CV: Denies chest pain, palpitations, syncope, peripheral edema, and claudication. Resp: Denies dyspnea at rest, cough, wheezing, coughing up blood, and pleurisy. GI: see HPI Derm: Denies rash, itching, dry skin Psych: Denies depression, anxiety, memory loss, confusion. No homicidal or suicidal ideation.  Heme: Denies bruising, bleeding, and enlarged lymph nodes.  Physical Exam: BP 122/81   Pulse 79   Temp (!) 97.5 F (36.4 C)   Ht 5\' 4"  (1.626 m)   Wt 202 lb 3.2 oz (91.7 kg)   BMI 34.71 kg/m  General:   Alert and oriented. No distress noted. Pleasant and cooperative.  Head:  Normocephalic and atraumatic. Eyes:  Conjuctiva clear without scleral icterus. Mouth:  mask in place Cardiac: S1 S2 present without murmurs Lungs: clear bilaterally Abdomen:  +BS, soft, mild TTP epigastric and non-distended. No rebound or guarding. No HSM or masses noted. Msk:  Symmetrical without gross deformities. Normal posture. Extremities:  Without edema. Neurologic:  Alert and  oriented x4 Psych:  Alert and cooperative. Normal mood and affect.  ASSESSMENT: Erica Lynch is a 60 y.o. female presenting today with a history of GERD, need for routine screening colonoscopy now, and new onset dyspepsia.  Dyspepsia: has been off PPI for some time and now with epigastric pain postprandially intermittently. No dysphagia, N/V. No prior NSAIDs but is now on diclofenac BID. Dexilant has helped  historically after trying multiple PPIs (Prilosec, Protonix, Nexium). States symptoms different than previously. Will pursue diagnostic EGD at time of routine screening colonoscopy. Does not sound biliary. May need CT if EGD is normal. If worsening pain, she is to call and will pursue labs and imaging.  Screening colonoscopy: last in 2012. No concerning lower GI signs/symptoms. No family history of colorectal cancer or polyps.    PLAN:  Proceed with colonoscopy/EGD by Dr. Gala Romney in near future using Propofol: the risks, benefits, and alternatives have been discussed with the patient in detail. The patient states understanding and desires to proceed.  Resume Dexilant. May need PA  Call if worsening symptoms  Imaging if EGD negative. Does not appear biliary related  Return in 6 months   Annitta Needs, PhD, [facility]

## 2021-06-24 NOTE — Telephone Encounter (Signed)
Pt informed we will call her to schedule TCS/EGD w/Propofol ASA 2 w/Dr. Gala Romney when his next schedule is available.

## 2021-06-24 NOTE — Patient Instructions (Signed)
I have sent in Erica Lynch to take once daily. Let me know if this is not covered!  We are arranging a colonoscopy/endoscopy with Dr. Gala Romney in the near future.  If pain worsens, please let me know!  Will see you back in 6 months!   I enjoyed seeing you again today! As you know, I value our relationship and want to provide genuine, compassionate, and quality care. I welcome your feedback. If you receive a survey regarding your visit,  I greatly appreciate you taking time to fill this out. See you next time!  Annitta Needs, PhD, ANP-BC [facility]

## 2021-06-24 NOTE — Telephone Encounter (Signed)
PA for Dexilant 60 mg capsules has been approved. Approval letter will be scanned into patient's chart.

## 2021-07-06 ENCOUNTER — Ambulatory Visit (INDEPENDENT_AMBULATORY_CARE_PROVIDER_SITE_OTHER): Payer: BC Managed Care – PPO | Admitting: Adult Health

## 2021-07-06 ENCOUNTER — Other Ambulatory Visit: Payer: Self-pay

## 2021-07-06 ENCOUNTER — Encounter: Payer: Self-pay | Admitting: Adult Health

## 2021-07-06 ENCOUNTER — Other Ambulatory Visit (HOSPITAL_COMMUNITY)
Admission: RE | Admit: 2021-07-06 | Discharge: 2021-07-06 | Disposition: A | Discharge status: Home / Self Care | Payer: BC Managed Care – PPO | Source: Ambulatory Visit | Attending: Adult Health | Admitting: Adult Health

## 2021-07-06 VITALS — BP 127/88 | HR 88 | Ht 64.0 in | Wt 202.0 lb

## 2021-07-06 DIAGNOSIS — Z1211 Encounter for screening for malignant neoplasm of colon: Secondary | ICD-10-CM

## 2021-07-06 DIAGNOSIS — R3915 Urgency of urination: Secondary | ICD-10-CM | POA: Insufficient documentation

## 2021-07-06 DIAGNOSIS — Z01419 Encounter for gynecological examination (general) (routine) without abnormal findings: Secondary | ICD-10-CM | POA: Diagnosis not present

## 2021-07-06 LAB — HEMOCCULT GUIAC POC 1CARD (OFFICE): Fecal Occult Blood, POC: NEGATIVE

## 2021-07-06 LAB — POCT URINALYSIS DIPSTICK
Blood, UA: NEGATIVE
Glucose, UA: NEGATIVE
Leukocytes, UA: NEGATIVE
Nitrite, UA: NEGATIVE
Protein, UA: NEGATIVE

## 2021-07-06 NOTE — Progress Notes (Signed)
Patient ID: Erica Lynch, female   DOB: August 07, 1961, 61 y.o.   MRN: [id number] History of Present Illness: Erica Lynch is a 60 year old white female, married, PM in for a well woman gyn exam and pap. She is still working.  PCP is Dr Willey Blade.   Current Medications, Allergies, Past Medical History, Past Surgical History, Family History and Social History were reviewed in [employer] record.     Review of Systems: Patient denies any headaches, hearing loss, fatigue, blurred vision, shortness of breath, chest pain, abdominal pain, problems with bowel movements, urination, or intercourse.(Not sexually active). No joint pain or mood swings.  She has some urinary urgency at times. She denies any vaginal bleeding. She is using estrace vaginal cream.  Physical Exam:BP 127/88 (BP Location: Left Arm, Patient Position: Sitting, Cuff Size: Normal)   Pulse 88   Ht 5\' 4"  (1.626 m)   Wt 202 lb (91.6 kg)   LMP  (LMP Unknown)   BMI 34.67 kg/m  urine dipstick is negative. General:  Well developed, well nourished, no acute distress Skin:  Warm and dry Neck:  Midline trachea, normal thyroid, good ROM, no lymphadenopathy,no carotid bruits heard Lungs; Clear to auscultation bilaterally Breast:  No dominant palpable mass, retraction, or nipple discharge Cardiovascular: Regular rate and rhythm Abdomen:  Soft, non tender, no hepatosplenomegaly Pelvic:  External genitalia is normal in appearance, no lesions.  The vagina is pale with loss of rugae. Urethra has no lesions or masses. The cervix is sooth, pap with hR HPV genotyping performed.  Uterus is felt to be normal size, shape, and contour.  No adnexal masses or tenderness noted.Bladder is non tender, no masses felt. Rectal: Good sphincter tone, no polyps, or hemorrhoids felt.  Hemoccult negative. Extremities/musculoskeletal:  No swelling or varicosities noted, no clubbing or cyanosis Psych:  No mood changes, alert and cooperative,seems  happy AA is 3 Fall risk is low Depression screen [facility] 2/9 07/06/2021 07/02/2020 04/13/2019  Decreased Interest 0 1 1  Down, Depressed, Hopeless 1 1 1   PHQ - 2 Score 1 2 2   Altered sleeping 1 1 1   Tired, decreased energy 1 1 1   Change in appetite 1 0 0  Feeling bad or failure about yourself  0 0 0  Trouble concentrating 0 0 1  Moving slowly or fidgety/restless 0 0 0  Suicidal thoughts 0 0 0  PHQ-9 Score 4 4 5   Difficult doing work/chores - Not difficult at all Not difficult at all  Some encounter information is confidential and restricted. Go to Review Flowsheets activity to see all data.   She is on meds from PCP. GAD 7 : Generalized Anxiety Score 07/06/2021 07/02/2020  Nervous, Anxious, on Edge 1 1  Control/stop worrying 1 1  Worry too much - different things 1 1  Trouble relaxing 1 1  Restless 0 0  Easily annoyed or irritable 1 1  Afraid - awful might happen 0 0  Total GAD 7 Score 5 5  Anxiety Difficulty - Not difficult at all  Some encounter information is confidential and restricted. Go to Review Flowsheets activity to see all data.      Upstream - 07/06/21 1538       Pregnancy Intention Screening   Does the patient want to become pregnant in the next year? No    Does the patient's partner want to become pregnant in the next year? No    Would the patient like to discuss contraceptive options today? No  Contraception Wrap Up   Current Method Female Sterilization    End Method Female Sterilization    Contraception Counseling Provided No              Examination chaperoned by [employer] RN   Impression and Plan: 1. Encounter for gynecological examination with Papanicolaou smear of cervix Pap sent Physical in 1 year Pap in 3 if normal Mammogram yearly was normal 06/08/21. Labs with PCP, has appt Friday with Dr Willey Blade. Colonoscopy per GI  2. Encounter for screening fecal occult blood testing   3. Urinary urgency

## 2021-07-08 ENCOUNTER — Telehealth: Payer: Self-pay | Admitting: *Deleted

## 2021-07-08 LAB — CYTOLOGY - PAP
Comment: NEGATIVE
Diagnosis: NEGATIVE
High risk HPV: NEGATIVE

## 2021-07-08 NOTE — Telephone Encounter (Signed)
Called pt to schedule TCS/EGD with propofol, asa 2 Dr. Gala Romney for November but patient prefers to wait for December d/t insurance reasons. Will call once we receive that schedule.

## 2021-07-10 DIAGNOSIS — G4452 New daily persistent headache (NDPH): Secondary | ICD-10-CM | POA: Diagnosis not present

## 2021-07-10 DIAGNOSIS — Z23 Encounter for immunization: Secondary | ICD-10-CM | POA: Diagnosis not present

## 2021-08-03 ENCOUNTER — Telehealth: Payer: Self-pay | Admitting: *Deleted

## 2021-08-03 MED ORDER — PEG 3350-KCL-NA BICARB-NACL 420 G PO SOLR: ORAL | 0 refills | Status: DC

## 2021-08-03 NOTE — Telephone Encounter (Signed)
Patient returned call. She has been scheduled for 12/12 at 9:15am. Aware will mail prep instructions and send rx to pharmacy.

## 2021-08-03 NOTE — Telephone Encounter (Signed)
LMOVM to call back to schedule TCS/EGD with propofol, asa 2 Dr. Gala Romney

## 2021-09-07 ENCOUNTER — Other Ambulatory Visit: Payer: Self-pay

## 2021-09-07 ENCOUNTER — Ambulatory Visit (HOSPITAL_COMMUNITY)
Admission: RE | Admit: 2021-09-07 | Discharge: 2021-09-07 | Disposition: A | Discharge status: Home / Self Care | Payer: BC Managed Care – PPO | Attending: Internal Medicine | Admitting: Internal Medicine

## 2021-09-07 ENCOUNTER — Encounter (HOSPITAL_COMMUNITY): Payer: Self-pay | Admitting: Internal Medicine

## 2021-09-07 ENCOUNTER — Ambulatory Visit (HOSPITAL_COMMUNITY): Payer: BC Managed Care – PPO | Admitting: Anesthesiology

## 2021-09-07 ENCOUNTER — Encounter (HOSPITAL_COMMUNITY)
Admission: RE | Disposition: A | Discharge status: Home / Self Care | Payer: Self-pay | Source: Home / Self Care | Attending: Internal Medicine

## 2021-09-07 DIAGNOSIS — K635 Polyp of colon: Secondary | ICD-10-CM | POA: Diagnosis not present

## 2021-09-07 DIAGNOSIS — K222 Esophageal obstruction: Secondary | ICD-10-CM | POA: Diagnosis not present

## 2021-09-07 DIAGNOSIS — G709 Myoneural disorder, unspecified: Secondary | ICD-10-CM | POA: Diagnosis not present

## 2021-09-07 DIAGNOSIS — K219 Gastro-esophageal reflux disease without esophagitis: Secondary | ICD-10-CM | POA: Diagnosis not present

## 2021-09-07 DIAGNOSIS — R1013 Epigastric pain: Secondary | ICD-10-CM | POA: Insufficient documentation

## 2021-09-07 DIAGNOSIS — Z1211 Encounter for screening for malignant neoplasm of colon: Secondary | ICD-10-CM | POA: Diagnosis not present

## 2021-09-07 DIAGNOSIS — F32A Depression, unspecified: Secondary | ICD-10-CM | POA: Diagnosis not present

## 2021-09-07 DIAGNOSIS — F419 Anxiety disorder, unspecified: Secondary | ICD-10-CM | POA: Diagnosis not present

## 2021-09-07 DIAGNOSIS — K640 First degree hemorrhoids: Secondary | ICD-10-CM | POA: Insufficient documentation

## 2021-09-07 DIAGNOSIS — K5730 Diverticulosis of large intestine without perforation or abscess without bleeding: Secondary | ICD-10-CM | POA: Insufficient documentation

## 2021-09-07 DIAGNOSIS — R519 Headache, unspecified: Secondary | ICD-10-CM | POA: Diagnosis not present

## 2021-09-07 DIAGNOSIS — K449 Diaphragmatic hernia without obstruction or gangrene: Secondary | ICD-10-CM | POA: Insufficient documentation

## 2021-09-07 DIAGNOSIS — D125 Benign neoplasm of sigmoid colon: Secondary | ICD-10-CM

## 2021-09-07 DIAGNOSIS — R1310 Dysphagia, unspecified: Secondary | ICD-10-CM | POA: Insufficient documentation

## 2021-09-07 HISTORY — PX: ESOPHAGOGASTRODUODENOSCOPY (EGD) WITH PROPOFOL: SHX5813

## 2021-09-07 HISTORY — PX: POLYPECTOMY: SHX5525

## 2021-09-07 HISTORY — PX: COLONOSCOPY WITH PROPOFOL: SHX5780

## 2021-09-07 HISTORY — PX: MALONEY DILATION: SHX5535

## 2021-09-07 SURGERY — COLONOSCOPY WITH PROPOFOL
Anesthesia: General

## 2021-09-07 MED ORDER — LIDOCAINE HCL (CARDIAC) PF 100 MG/5ML IV SOSY
PREFILLED_SYRINGE | INTRAVENOUS | Status: DC | PRN
  Administered 2021-09-07 (×2): 50 mg via INTRAVENOUS

## 2021-09-07 MED ORDER — PROPOFOL 500 MG/50ML IV EMUL
INTRAVENOUS | Status: DC | PRN
  Administered 2021-09-07: 150 ug/kg/min via INTRAVENOUS

## 2021-09-07 MED ORDER — STERILE WATER FOR IRRIGATION IR SOLN
Status: DC | PRN
  Administered 2021-09-07: 60 mL

## 2021-09-07 MED ORDER — PROPOFOL 10 MG/ML IV BOLUS
INTRAVENOUS | Status: DC | PRN
  Administered 2021-09-07: 100 mg via INTRAVENOUS

## 2021-09-07 MED ORDER — LACTATED RINGERS IV SOLN: INTRAVENOUS | Status: DC

## 2021-09-07 NOTE — Anesthesia Preprocedure Evaluation (Addendum)
Anesthesia Evaluation  Patient identified by MRN, date of birth, ID band Patient awake    Reviewed: Allergy & Precautions, H&P , NPO status , Patient's Chart, lab work & pertinent test results  Airway Mallampati: II  TM Distance: >3 FB Neck ROM: Full    Dental  (+) Dental Advisory Given, Caps   Pulmonary neg pulmonary ROS,    Pulmonary exam normal breath sounds clear to auscultation       Cardiovascular negative cardio ROS Normal cardiovascular exam Rhythm:Regular Rate:Normal     Neuro/Psych  Headaches, PSYCHIATRIC DISORDERS Anxiety Depression  Neuromuscular disease    GI/Hepatic Neg liver ROS, GERD  Medicated and Controlled,  Endo/Other  negative endocrine ROS  Renal/GU negative Renal ROS  negative genitourinary   Musculoskeletal negative musculoskeletal ROS (+)   Abdominal   Peds negative pediatric ROS (+)  Hematology negative hematology ROS (+)   Anesthesia Other Findings   Reproductive/Obstetrics negative OB ROS                            Anesthesia Physical Anesthesia Plan  ASA: 2  Anesthesia Plan: General   Post-op Pain Management: Minimal or no pain anticipated   Induction:   PONV Risk Score and Plan: TIVA  Airway Management Planned: Nasal Cannula and Natural Airway  Additional Equipment:   Intra-op Plan:   Post-operative Plan:   Informed Consent: I have reviewed the patients History and Physical, chart, labs and discussed the procedure including the risks, benefits and alternatives for the proposed anesthesia with the patient or authorized representative who has indicated his/her understanding and acceptance.     Dental advisory given  Plan Discussed with: CRNA and Surgeon  Anesthesia Plan Comments:         Anesthesia Quick Evaluation

## 2021-09-07 NOTE — Op Note (Signed)
[facility] Patient Name: Erica Lynch Procedure Date: 09/07/2021 8:03 AM MRN: [id number] Date of Birth: Dec 04, 1960 Attending MD: Norvel Richards , MD CSN: [id number] Age: 60 Admit Type: Outpatient Procedure:                Colonoscopy Indications:              Screening for colorectal malignant neoplasm Providers:                Norvel Richards, MD, Rosina Lowenstein, RN, Casimer Bilis, Technician Referring MD:              Medicines:                Propofol per Anesthesia Complications:            No immediate complications. Estimated Blood Loss:     Estimated blood loss was minimal. Procedure:                Pre-Anesthesia Assessment:                           - Prior to the procedure, a History and Physical                            was performed, and patient medications and                            allergies were reviewed. The patient's tolerance of                            previous anesthesia was also reviewed. The risks                            and benefits of the procedure and the sedation                            options and risks were discussed with the patient.                            All questions were answered, and informed consent                            was obtained. Prior Anticoagulants: The patient has                            taken no previous anticoagulant or antiplatelet                            agents. ASA Grade Assessment: II - A patient with                            mild systemic disease. After reviewing the risks  and benefits, the patient was deemed in                            satisfactory condition to undergo the procedure.                           After obtaining informed consent, the colonoscope                            was passed under direct vision. Throughout the                            procedure, the patient's blood pressure, pulse, and                             oxygen saturations were monitored continuously. The                            [id number]) scope was introduced through the                            anus and advanced to the the cecum, identified by                            appendiceal orifice and ileocecal valve. The                            colonoscopy was performed without difficulty. The                            patient tolerated the procedure well. The quality                            of the bowel preparation was adequate. Scope In: 9:27:32 AM Scope Out: 9:38:07 AM Scope Withdrawal Time: 0 hours 8 minutes 12 seconds  Total Procedure Duration: 0 hours 10 minutes 35 seconds  Findings:      The perianal and digital rectal examinations were normal.      Non-bleeding internal hemorrhoids were found during retroflexion. The       hemorrhoids were moderate, medium-sized and Grade I (internal       hemorrhoids that do not prolapse).      A 5 mm polyp was found in the sigmoid colon. The polyp was sessile. The       polyp was removed with a cold snare. Resection and retrieval were       complete. Estimated blood loss was minimal.      Scattered medium-mouthed diverticula were found in the sigmoid colon and       descending colon.      The exam was otherwise without abnormality on direct and retroflexion       views. Impression:               - Non-bleeding internal hemorrhoids.                           - One 5 mm polyp  in the sigmoid colon, removed with                            a cold snare. Resected and retrieved.                           - Diverticulosis in the sigmoid colon and in the                            descending colon.                           - The examination was otherwise normal on direct                            and retroflexion views. Moderate Sedation:      Moderate (conscious) sedation was personally administered by an       anesthesia professional. The following parameters were  monitored: oxygen       saturation, heart rate, blood pressure, respiratory rate, EKG, adequacy       of pulmonary ventilation, and response to care. Recommendation:           - Patient has a contact number available for                            emergencies. The signs and symptoms of potential                            delayed complications were discussed with the                            patient. Return to normal activities tomorrow.                            Written discharge instructions were provided to the                            patient.                           - Advance diet as tolerated.                           - Continue present medications.                           - Repeat colonoscopy date to be determined after                            pending pathology results are reviewed for                            surveillance.                           - Return to GI office in 3 months. Procedure Code(s):        ---  Professional ---                           [phone], Colonoscopy, flexible; with removal of                            tumor(s), polyp(s), or other lesion(s) by snare                            technique Diagnosis Code(s):        --- Professional ---                           Z12.11, Encounter for screening for malignant                            neoplasm of colon                           K63.5, Polyp of colon                           K64.0, First degree hemorrhoids                           K57.30, Diverticulosis of large intestine without                            perforation or abscess without bleeding CPT copyright 2019 American Medical Association. All rights reserved. The codes documented in this report are preliminary and upon coder review may  be revised to meet current compliance requirements. [name]. Esli Jernigan, MD Norvel Richards, MD 09/07/2021 9:42:51 AM This report has been signed electronically. Number of Addenda: 0

## 2021-09-07 NOTE — H&P (Signed)
@LOGO @   Primary Care Physician:  Asencion Noble, MD Primary Gastroenterologist:  Dr. Gala Romney  Pre-Procedure History & Physical: HPI:  Erica Lynch is a 60 y.o. female here for a average rescreening colonoscopy.  Negative exam 10 years ago.  No bowel symptoms no family history colon cancer.  Patient does have a significant issues with GERD/dyspeptic symptoms over the past several weeks.  Does note dysphagia to solids from time to time.  Stopped diclofenac.  Has been on Dexilant 60 mg daily for at least the past several weeks.  Globally, her upper GI tract symptoms have improved.  Past Medical History:  Diagnosis Date   Anxiety    Atypical mole 04/03/1996   moderate atypia - upper anterior left thigh (no treatment)   Atypical mole 09/11/1996   slight to moderate atypia - left buttocks (no treatment)   Atypical mole 11/19/1996   slight atypia - mid left paraspinal (no treatment)   Atypical mole 04/04/2001   slight atypia - right scapula (no treatment)   Atypical mole 12/22/2001   mild atypia - right mid calf (no treatment)   Atypical mole 12/22/2001   moderate atypia - right anterior neck (widershave)   Atypical mole 11/23/2007   moderate atypia - right upper back (widershave)   Atypical mole 07/21/2011   mild atypia - right upper inner thigh   Atypical mole 07/21/2011   mild atypia - right calf   Atypical mole 02/04/2016   moderate atypia - left calf (widershave)   Depression    Fatigue 12/04/2015   GERD (gastroesophageal reflux disease)    Hematuria 05/22/2014   History of breast lump 02/19/2014   Hot flashes 12/04/2015   Hyperlipidemia    IBS (irritable bowel syndrome)    Left breast mass 11/07/2013   LLQ pain 05/22/2014   Lumbar herniated disc November 2016   Other and unspecified ovarian cyst 05/23/2014   Squamous cell carcinoma of skin 12/22/2001   right inner cheek - CX3 + excision (01/26/2002)   Squamous cell carcinoma of skin 12/22/2001   left outer cheek - CX  (02/23/2002)   Squamous cell carcinoma of skin 07/13/2017   right chest - tx p bx   Unspecified symptom associated with female genital organs 05/22/2014   Vitamin D deficiency 12/05/2015    Past Surgical History:  Procedure Laterality Date   [place]   COLONOSCOPY  08/2011   Dr. Gala Romney: normal, next colonoscopy December 2022.   ENDOMETRIAL ABLATION  2-3 yrs ago   [facility]   ESOPHAGOGASTRODUODENOSCOPY (EGD) WITH PROPOFOL N/A 11/21/2017   Dr. Gala Romney: Normal esophagus, status post dilation, small hiatal hernia   MALONEY DILATION N/A 11/21/2017   Procedure: MALONEY DILATION;  Surgeon: Daneil Dolin, MD;  Location: [facility];  Service: Endoscopy;  Laterality: N/A;   OTHER SURGICAL HISTORY     Uterine ablation   TUBAL LIGATION  6 yrs ago   [facility]    Prior to Admission medications   Medication Sig Start Date End Date Taking? Authorizing Provider  buPROPion (WELLBUTRIN XL) 150 MG 24 hr tablet Take 150 mg by mouth daily. 12/27/19  Yes [provider]  clonazePAM (KLONOPIN) 1 MG tablet Take 1 tablet (1 mg total) by mouth 2 (two) times daily as needed for anxiety. Patient taking differently: Take 1 mg by mouth at bedtime. 11/14/17  Yes Cloria Spring, MD  dexlansoprazole (DEXILANT) 60 MG capsule Take 1 capsule (60 mg total) by mouth daily. 06/24/21  Yes Cyndi Bender,  Leandra Kern, NP  estradiol (ESTRACE) 0.1 MG/GM vaginal cream USE 1 GRAM IN VAGINA TWICE WEEKLY. 07/02/20  Yes Estill Dooms, NP  polyethylene glycol-electrolytes (NULYTELY) 420 g solution As directed 08/03/21  Yes Relda Agosto, [name], MD  topiramate (TOPAMAX) 100 MG tablet Take 100 mg by mouth daily. 05/22/21  Yes [provider]    Allergies as of 08/03/2021   (No Known Allergies)    Family History  Problem Relation Age of Onset   Lung cancer Mother        deceased from lung cancer at age 57   Breast cancer Mother    Cancer Mother        breast at age 24   Depression Mother    Cirrhosis Father         etoh    Anxiety disorder Father    Alcohol abuse Father    Depression Father    Diabetes Maternal Grandfather    Heart disease Maternal Grandfather    Depression Maternal Grandmother    Depression Maternal Uncle    Depression Maternal Uncle    Anesthesia problems Neg Hx    Hypotension Neg Hx    Malignant hyperthermia Neg Hx    Pseudochol deficiency Neg Hx    Colon cancer Neg Hx    Colon polyps Neg Hx     Social History   Socioeconomic History   Marital status: Married    Spouse name: Not on file   Number of children: 1   Years of education: Not on file   Highest education level: Not on file  Occupational History   Occupation: Artist: [employer]  Tobacco Use   Smoking status: Never   Smokeless tobacco: Never  Vaping Use   Vaping Use: Never used  Substance and Sexual Activity   Alcohol use: Yes    Comment: rarely   Drug use: No   Sexual activity: Not Currently    Birth control/protection: Surgical, Post-menopausal    Comment: tubal and ablation  Other Topics Concern   Not on file  Social History Narrative   1 son-16         Social Determinants of Health   Financial Resource Strain: Low Risk    Difficulty of Paying Living Expenses: Not very hard  Food Insecurity: No Food Insecurity   Worried About Charity fundraiser in the Last Year: Never true   Ran Out of Food in the Last Year: Never true  Transportation Needs: No Transportation Needs   Lack of Transportation (Medical): No   Lack of Transportation (Non-Medical): No  Physical Activity: Insufficiently Active   Days of Exercise per Week: 2 days   Minutes of Exercise per Session: 30 min  Stress: Stress Concern Present   Feeling of Stress : To some extent  Social Connections: Moderately Integrated   Frequency of Communication with Friends and Family: More than three times a week   Frequency of Social Gatherings with Friends and Family: Once a week   Attends Religious  Services: More than 4 times per year   Active Member of [employer] or Organizations: No   Attends Music therapist: Patient refused   Marital Status: Married  Human resources officer Violence: Not At Risk   Fear of Current or Ex-Partner: No   Emotionally Abused: No   Physically Abused: No   Sexually Abused: No    Review of Systems: See HPI, otherwise negative ROS  Physical Exam:  BP 133/75   Pulse 95   Temp 97.6 F (36.4 C) (Oral)   Resp 15   Ht 5\' 4"  (1.626 m)   Wt 87.1 kg   SpO2 97%   BMI 32.96 kg/m  General:   Alert,  Well-developed, well-nourished, pleasant and cooperative in NAD Neck:  Supple; no masses or thyromegaly. No significant cervical adenopathy. Lungs:  Clear throughout to auscultation.   No wheezes, crackles, or rhonchi. No acute distress. Heart:  Regular rate and rhythm; no murmurs, clicks, rubs,  or gallops. Abdomen: Non-distended, normal bowel sounds.  Soft and nontender without appreciable mass or hepatosplenomegaly.  Pulses:  Normal pulses noted. Extremities:  Without clubbing or edema.  Impression/Plan: 60 year old lady with worsening dyspepsia/GERD off PPI; now back on Dexilant 60 mg daily.  Esophageal dysphagia vaguely.  Its been 10 years since she had a colonoscopy.  Recommendations:  I have offered the patient an EGD with possible esophageal dilation as feasible/appropriate along with an average risk screening colonoscopy today per plan.  The risks, benefits, limitations, imponderables and alternatives regarding both EGD and colonoscopy have been reviewed with the patient. Questions have been answered. All parties agreeable.       Notice: This dictation was prepared with Dragon dictation along with smaller phrase technology. Any transcriptional errors that result from this process are unintentional and may not be corrected upon review.

## 2021-09-07 NOTE — Discharge Instructions (Addendum)
EGD Discharge instructions Please read the instructions outlined below and refer to this sheet in the next few weeks. These discharge instructions provide you with general information on caring for yourself after you leave the hospital. Your doctor may also give you specific instructions. While your treatment has been planned according to the most current medical practices available, unavoidable complications occasionally occur. If you have any problems or questions after discharge, please call your doctor. ACTIVITY You may resume your regular activity but move at a slower pace for the next 24 hours.  Take frequent rest periods for the next 24 hours.  Walking will help expel (get rid of) the air and reduce the bloated feeling in your abdomen.  No driving for 24 hours (because of the anesthesia (medicine) used during the test).  You may shower.  Do not sign any important legal documents or operate any machinery for 24 hours (because of the anesthesia used during the test).  NUTRITION Drink plenty of fluids.  You may resume your normal diet.  Begin with a light meal and progress to your normal diet.  Avoid alcoholic beverages for 24 hours or as instructed by your caregiver.  MEDICATIONS You may resume your normal medications unless your caregiver tells you otherwise.  WHAT YOU CAN EXPECT TODAY You may experience abdominal discomfort such as a feeling of fullness or "gas" pains.  FOLLOW-UP Your doctor will discuss the results of your test with you.  SEEK IMMEDIATE MEDICAL ATTENTION IF ANY OF THE FOLLOWING OCCUR: Excessive nausea (feeling sick to your stomach) and/or vomiting.  Severe abdominal pain and distention (swelling).  Trouble swallowing.  Temperature over 101 F (37.8 C).  Rectal bleeding or vomiting of blood.    Colonoscopy Discharge Instructions  Read the instructions outlined below and refer to this sheet in the next few weeks. These discharge instructions provide you with  general information on caring for yourself after you leave the hospital. Your doctor may also give you specific instructions. While your treatment has been planned according to the most current medical practices available, unavoidable complications occasionally occur. If you have any problems or questions after discharge, call Dr. Gala Romney at [phone]. ACTIVITY You may resume your regular activity, but move at a slower pace for the next 24 hours.  Take frequent rest periods for the next 24 hours.  Walking will help get rid of the air and reduce the bloated feeling in your belly (abdomen).  No driving for 24 hours (because of the medicine (anesthesia) used during the test).   Do not sign any important legal documents or operate any machinery for 24 hours (because of the anesthesia used during the test).  NUTRITION Drink plenty of fluids.  You may resume your normal diet as instructed by your doctor.  Begin with a light meal and progress to your normal diet. Heavy or fried foods are harder to digest and may make you feel sick to your stomach (nauseated).  Avoid alcoholic beverages for 24 hours or as instructed.  MEDICATIONS You may resume your normal medications unless your doctor tells you otherwise.  WHAT YOU CAN EXPECT TODAY Some feelings of bloating in the abdomen.  Passage of more gas than usual.  Spotting of blood in your stool or on the toilet paper.  IF YOU HAD POLYPS REMOVED DURING THE COLONOSCOPY: No aspirin products for 7 days or as instructed.  No alcohol for 7 days or as instructed.  Eat a soft diet for the next 24 hours.  FINDING  OUT THE RESULTS OF YOUR TEST Not all test results are available during your visit. If your test results are not back during the visit, make an appointment with your caregiver to find out the results. Do not assume everything is normal if you have not heard from your caregiver or the medical facility. It is important for you to follow up on all of your test  results.  SEEK IMMEDIATE MEDICAL ATTENTION IF: You have more than a spotting of blood in your stool.  Your belly is swollen (abdominal distention).  You are nauseated or vomiting.  You have a temperature over 101.  You have abdominal pain or discomfort that is severe or gets worse throughout the day.    You have a small hiatal hernia.  Your esophagus was slightly narrowed and was stretched today  You had 1 polyp in your colon removed  Diverticulosis, GERD and colon polyp information provided  Continue Dexilant 60 mg daily indefinitely  Further recommendations to follow pending review of pathology report  Office visit with [place] in 3 months  At patient request, I called Keenan Bachelor at [phone] -rolled to voicemail.  Left a message.

## 2021-09-07 NOTE — Anesthesia Postprocedure Evaluation (Signed)
Anesthesia Post Note  Patient: Erica Lynch  Procedure(s) Performed: COLONOSCOPY WITH PROPOFOL ESOPHAGOGASTRODUODENOSCOPY (EGD) WITH PROPOFOL [place] POLYPECTOMY  Patient location during evaluation: Endoscopy Anesthesia Type: General Level of consciousness: awake and alert and oriented Pain management: pain level controlled Vital Signs Assessment: post-procedure vital signs reviewed and stable Respiratory status: spontaneous breathing, nonlabored ventilation and respiratory function stable Cardiovascular status: blood pressure returned to baseline and stable Postop Assessment: no apparent nausea or vomiting Anesthetic complications: no   No notable events documented.   Last Vitals:  Vitals:   09/07/21 0804 09/07/21 0940  BP: 133/75 101/62  Pulse: 95 85  Resp: 15 13  Temp: 36.4 C 36.5 C  SpO2: 97% 97%    Last Pain:  Vitals:   09/07/21 0940  TempSrc: Axillary  PainSc: 0-No pain                 Tylena Prisk C Makaleigh Reinard

## 2021-09-07 NOTE — Transfer of Care (Signed)
Immediate Anesthesia Transfer of Care Note  Patient: Erica Lynch  Procedure(s) Performed: COLONOSCOPY WITH PROPOFOL ESOPHAGOGASTRODUODENOSCOPY (EGD) WITH PROPOFOL MALONEY DILATION POLYPECTOMY  Patient Location: Endoscopy Unit  Anesthesia Type:General  Level of Consciousness: drowsy  Airway & Oxygen Therapy: Patient Spontanous Breathing  Post-op Assessment: Report given to RN and Post -op Vital signs reviewed and stable  Post vital signs: Reviewed and stable  Last Vitals:  Vitals Value Taken Time  BP 101/62 09/07/21 0940  Temp 36.5 C 09/07/21 0940  Pulse 85 09/07/21 0940  Resp 13 09/07/21 0940  SpO2 97 % 09/07/21 0940    Last Pain:  Vitals:   09/07/21 0940  TempSrc: Axillary  PainSc: 0-No pain      Patients Stated Pain Goal: 9 (97/35/32 9924)  Complications: No notable events documented.

## 2021-09-07 NOTE — Op Note (Signed)
[facility] Patient Name: Erica Lynch Procedure Date: 09/07/2021 8:54 AM MRN: [id number] Date of Birth: Aug 30, 1961 Attending MD: Norvel Richards , MD CSN: [id number] Age: 60 Admit Type: Outpatient Procedure:                Upper GI endoscopy Indications:              Dyspepsia, Dysphagia Providers:                Norvel Richards, MD, Rosina Lowenstein, RN, Wynonia Musty Tech, Technician Referring MD:              Medicines:                Propofol per Anesthesia Complications:            No immediate complications. Estimated Blood Loss:     Estimated blood loss was minimal. Procedure:                Pre-Anesthesia Assessment:                           - Prior to the procedure, a History and Physical                            was performed, and patient medications and                            allergies were reviewed. The patient's tolerance of                            previous anesthesia was also reviewed. The risks                            and benefits of the procedure and the sedation                            options and risks were discussed with the patient.                            All questions were answered, and informed consent                            was obtained. Prior Anticoagulants: The patient has                            taken no previous anticoagulant or antiplatelet                            agents. ASA Grade Assessment: II - A patient with                            mild systemic disease. After reviewing the risks  and benefits, the patient was deemed in                            satisfactory condition to undergo the procedure.                           After obtaining informed consent, the endoscope was                            passed under direct vision. Throughout the                            procedure, the patient's blood pressure, pulse, and                            oxygen  saturations were monitored continuously. The                            GIF-H190 ([id number]) scope was introduced through the                            mouth, and advanced to the second part of duodenum.                            The upper GI endoscopy was accomplished without                            difficulty. The patient tolerated the procedure                            well. Scope In: 9:18:32 AM Scope Out: 9:23:16 AM Total Procedure Duration: 0 hours 4 minutes 44 seconds  Findings:      A mild Schatzki ring was found at the gastroesophageal junction. No       tumor seen. No Barrett's epithelium seen. No esophagitis seen.      A medium-sized hiatal hernia was present.      Stomach otherwise appeared normal. Pylorus patent..      The duodenal bulb and second portion of the duodenum were normal. The       scope was withdrawn. Dilation was performed with a Maloney dilator with       mild resistance at 56 Fr. The dilation site was examined following       endoscope reinsertion and showed no change. Estimated blood loss was       minimal. Impression:               - Mild Schatzki ring. Dilated.                           - Medium-sized hiatal hernia.                           - Normal stomach.                           - Normal duodenal bulb and second portion of the  duodenum.                           - No specimens collected. Moderate Sedation:      Moderate (conscious) sedation was personally administered by an       anesthesia professional. The following parameters were monitored: oxygen       saturation, heart rate, blood pressure, respiratory rate, EKG, adequacy       of pulmonary ventilation, and response to care. Recommendation:           - Patient has a contact number available for                            emergencies. The signs and symptoms of potential                            delayed complications were discussed with the                             patient. Return to normal activities tomorrow.                            Written discharge instructions were provided to the                            patient.                           - Resume previous diet.                           - Continue present medications. Continue Dexilant                            60 mg daily.                           - Return to my office in 3 months. See colonoscopy                            report. Procedure Code(s):        --- Professional ---                           [phone], Esophagogastroduodenoscopy, flexible,                            transoral; diagnostic, including collection of                            specimen(s) by brushing or washing, when performed                            (separate procedure)                           40347, Dilation of esophagus, by unguided sound or  bougie, single or multiple passes Diagnosis Code(s):        --- Professional ---                           K22.2, Esophageal obstruction                           K44.9, Diaphragmatic hernia without obstruction or                            gangrene                           R10.13, Epigastric pain                           R13.10, Dysphagia, unspecified CPT copyright 2019 American Medical Association. All rights reserved. The codes documented in this report are preliminary and upon coder review may  be revised to meet current compliance requirements. [name]. [name], MD Norvel Richards, MD 09/07/2021 9:39:56 AM This report has been signed electronically. Number of Addenda: 0

## 2021-09-09 LAB — SURGICAL PATHOLOGY

## 2021-09-10 ENCOUNTER — Encounter (HOSPITAL_COMMUNITY): Payer: Self-pay | Admitting: Internal Medicine

## 2021-09-11 ENCOUNTER — Encounter: Payer: Self-pay | Admitting: Internal Medicine

## 2021-10-09 DIAGNOSIS — F329 Major depressive disorder, single episode, unspecified: Secondary | ICD-10-CM | POA: Diagnosis not present

## 2021-10-09 DIAGNOSIS — Z79899 Other long term (current) drug therapy: Secondary | ICD-10-CM | POA: Diagnosis not present

## 2021-10-09 DIAGNOSIS — F419 Anxiety disorder, unspecified: Secondary | ICD-10-CM | POA: Diagnosis not present

## 2021-10-09 DIAGNOSIS — G4452 New daily persistent headache (NDPH): Secondary | ICD-10-CM | POA: Diagnosis not present

## 2021-10-15 DIAGNOSIS — G4452 New daily persistent headache (NDPH): Secondary | ICD-10-CM | POA: Diagnosis not present

## 2021-10-15 DIAGNOSIS — F331 Major depressive disorder, recurrent, moderate: Secondary | ICD-10-CM | POA: Diagnosis not present

## 2021-12-17 ENCOUNTER — Ambulatory Visit: Payer: Self-pay | Admitting: Physician Assistant

## 2021-12-17 DIAGNOSIS — H04123 Dry eye syndrome of bilateral lacrimal glands: Secondary | ICD-10-CM | POA: Diagnosis not present

## 2021-12-17 DIAGNOSIS — H524 Presbyopia: Secondary | ICD-10-CM | POA: Diagnosis not present

## 2021-12-22 ENCOUNTER — Other Ambulatory Visit: Payer: Self-pay

## 2021-12-22 ENCOUNTER — Encounter: Payer: Self-pay | Admitting: Gastroenterology

## 2021-12-22 ENCOUNTER — Ambulatory Visit: Payer: BC Managed Care – PPO | Admitting: Gastroenterology

## 2021-12-22 VITALS — BP 106/72 | HR 110 | Temp 97.3°F | Ht 64.0 in | Wt 193.4 lb

## 2021-12-22 DIAGNOSIS — K219 Gastro-esophageal reflux disease without esophagitis: Secondary | ICD-10-CM

## 2021-12-22 DIAGNOSIS — K642 Third degree hemorrhoids: Secondary | ICD-10-CM

## 2021-12-22 NOTE — Patient Instructions (Signed)
I will see you back in 1 year! ? ?Avoid straining, limit toilet time to 2-3 minutes, and call me if any changes. ? ?I enjoyed seeing you again today! As you know, I value our relationship and want to provide genuine, compassionate, and quality care. I welcome your feedback. If you receive a survey regarding your visit,  I greatly appreciate you taking time to fill this out. See you next time! ? ?Annitta Needs, PhD, ANP-BC ?[facility]  ? ?

## 2021-12-22 NOTE — Progress Notes (Signed)
? ? ? ? ? ?Gastroenterology Office Note   ? ? ?Primary Care Physician:  Asencion Noble, MD  ?Primary Gastroenterologist: Dr. Gala Romney  ? ? ?Chief Complaint  ? ?Chief Complaint  ?Patient presents with  ? Follow-up  ? skin irritation on anus   ? ? ? ?History of Present Illness  ? ?URVI IMES is a 61 y.o. female presenting today in follow-up with a history of GERD, epigastric pain, and recently undergoing EGD/colonoscopy in interim from last visit.  ? ?EGD with mild Schatzki ring, medium-sized hiatal hernia, duodenum normal, s/p dilation. Colonoscopy with hyperplastic polyp.  ? ?GERD doing well on Dexilant. Epigastric pain resolved.  ? ?Area at rectum. Noticed about 2 weeks ago after dealing with norovirus. No pain or bleeding. When wiping notices something there. Like a little pea. To the left of the rectum. No itching. No drainage.  ? ? ?Past Medical History:  ?Diagnosis Date  ? Anxiety   ? Atypical mole 04/03/1996  ? moderate atypia - upper anterior left thigh (no treatment)  ? Atypical mole 09/11/1996  ? slight to moderate atypia - left buttocks (no treatment)  ? Atypical mole 11/19/1996  ? slight atypia - mid left paraspinal (no treatment)  ? Atypical mole 04/04/2001  ? slight atypia - right scapula (no treatment)  ? Atypical mole 12/22/2001  ? mild atypia - right mid calf (no treatment)  ? Atypical mole 12/22/2001  ? moderate atypia - right anterior neck (widershave)  ? Atypical mole 11/23/2007  ? moderate atypia - right upper back (widershave)  ? Atypical mole 07/21/2011  ? mild atypia - right upper inner thigh  ? Atypical mole 07/21/2011  ? mild atypia - right calf  ? Atypical mole 02/04/2016  ? moderate atypia - left calf (widershave)  ? Depression   ? Fatigue 12/04/2015  ? GERD (gastroesophageal reflux disease)   ? Hematuria 05/22/2014  ? History of breast lump 02/19/2014  ? Hot flashes 12/04/2015  ? Hyperlipidemia   ? IBS (irritable bowel syndrome)   ? Left breast mass 11/07/2013  ? LLQ pain 05/22/2014  ? Lumbar  herniated disc November 2016  ? Other and unspecified ovarian cyst 05/23/2014  ? Squamous cell carcinoma of skin 12/22/2001  ? right inner cheek - CX3 + excision (01/26/2002)  ? Squamous cell carcinoma of skin 12/22/2001  ? left outer cheek - CX (02/23/2002)  ? Squamous cell carcinoma of skin 07/13/2017  ? right chest - tx p bx  ? Unspecified symptom associated with female genital organs 05/22/2014  ? Vitamin D deficiency 12/05/2015  ? ? ?Past Surgical History:  ?Procedure Laterality Date  ? [place]  ? COLONOSCOPY  08/2011  ? Dr. Gala Romney: normal, next colonoscopy December 2022.  ? COLONOSCOPY WITH PROPOFOL N/A 09/07/2021  ? non-bleeding internal hemorrhoids, one 5 mm polyp in sigmoid, sigmoid and descending colon diverticulosis. Hyperplastic polyp.  ? ENDOMETRIAL ABLATION  2-3 yrs ago  ? [facility]  ? ESOPHAGOGASTRODUODENOSCOPY (EGD) WITH PROPOFOL N/A 11/21/2017  ? Dr. Gala Romney: Normal esophagus, status post dilation, small hiatal hernia  ? ESOPHAGOGASTRODUODENOSCOPY (EGD) WITH PROPOFOL N/A 09/07/2021  ? mild Schatzki ring, medium-sized hiatal hernia, duodenum normal, s/p dilation.  ? MALONEY DILATION N/A 11/21/2017  ? Procedure: MALONEY DILATION;  Surgeon: Daneil Dolin, MD;  [facility];  Service: Endoscopy;  Laterality: N/A;  ? MALONEY DILATION  09/07/2021  ? Procedure: MALONEY DILATION;  Surgeon: Daneil Dolin, MD;  Location: [facility];  Service: Endoscopy;;  ?  OTHER SURGICAL HISTORY    ? Uterine ablation  ? POLYPECTOMY  09/07/2021  ? Procedure: POLYPECTOMY;  Surgeon: Daneil Dolin, MD;  Location: [facility];  Service: Endoscopy;;  ? TUBAL LIGATION  6 yrs ago  ? [facility]  ? ? ?Current Outpatient Medications  ?Medication Sig Dispense Refill  ? buPROPion (WELLBUTRIN XL) 150 MG 24 hr tablet Take 150 mg by mouth daily.    ? clonazePAM (KLONOPIN) 1 MG tablet Take 1 tablet (1 mg total) by mouth 2 (two) times daily as needed for anxiety. (Patient taking differently: Take 1 mg by mouth  at bedtime.) 60 tablet 5  ? dexlansoprazole (DEXILANT) 60 MG capsule Take 1 capsule (60 mg total) by mouth daily. 90 capsule 3  ? estradiol (ESTRACE) 0.1 MG/GM vaginal cream USE 1 GRAM IN VAGINA TWICE WEEKLY. 42.5 g 2  ? topiramate (TOPAMAX) 100 MG tablet Take 100 mg by mouth daily.    ? polyethylene glycol-electrolytes (NULYTELY) 420 g solution As directed 4000 mL 0  ? ?No current facility-administered medications for this visit.  ? ? ?Allergies as of 12/22/2021  ? (No Known Allergies)  ? ? ?Family History  ?Problem Relation Age of Onset  ? Lung cancer Mother   ?     deceased from lung cancer at age 15  ? Breast cancer Mother   ? Cancer Mother   ?     breast at age 73  ? Depression Mother   ? Cirrhosis Father   ?     etoh   ? Anxiety disorder Father   ? Alcohol abuse Father   ? Depression Father   ? Diabetes Maternal Grandfather   ? Heart disease Maternal Grandfather   ? Depression Maternal Grandmother   ? Depression Maternal Uncle   ? Depression Maternal Uncle   ? Anesthesia problems Neg Hx   ? Hypotension Neg Hx   ? Malignant hyperthermia Neg Hx   ? Pseudochol deficiency Neg Hx   ? Colon cancer Neg Hx   ? Colon polyps Neg Hx   ? ? ?Social History  ? ?Socioeconomic History  ? Marital status: Married  ?  Spouse name: Not on file  ? Number of children: 1  ? Years of education: Not on file  ? Highest education level: Not on file  ?Occupational History  ? Occupation: Chief Operating Officer  ?  Employer: [employer]  ?Tobacco Use  ? Smoking status: Never  ? Smokeless tobacco: Never  ?Vaping Use  ? Vaping Use: Never used  ?Substance and Sexual Activity  ? Alcohol use: Yes  ?  Comment: rarely  ? Drug use: No  ? Sexual activity: Not Currently  ?  Birth control/protection: Surgical, Post-menopausal  ?  Comment: tubal and ablation  ?Other Topics Concern  ? Not on file  ?Social History Narrative  ? 1 son-16  ?   ?   ? ?Social Determinants of Health  ? ?Financial Resource Strain: Low Risk   ? Difficulty of Paying Living  Expenses: Not very hard  ?Food Insecurity: No Food Insecurity  ? Worried About Charity fundraiser in the Last Year: Never true  ? Ran Out of Food in the Last Year: Never true  ?Transportation Needs: No Transportation Needs  ? Lack of Transportation (Medical): No  ? Lack of Transportation (Non-Medical): No  ?Physical Activity: Insufficiently Active  ? Days of Exercise per Week: 2 days  ? Minutes of Exercise per Session: 30 min  ?  Stress: Stress Concern Present  ? Feeling of Stress : To some extent  ?Social Connections: Moderately Integrated  ? Frequency of Communication with Friends and Family: More than three times a week  ? Frequency of Social Gatherings with Friends and Family: Once a week  ? Attends Religious Services: More than 4 times per year  ? Active Member of Clubs or Organizations: No  ? Attends Archivist Meetings: Patient refused  ? Marital Status: Married  ?Intimate Partner Violence: Not At Risk  ? Fear of Current or Ex-Partner: No  ? Emotionally Abused: No  ? Physically Abused: No  ? Sexually Abused: No  ? ? ? ?Review of Systems  ? ?Gen: Denies any fever, chills, fatigue, weight loss, lack of appetite.  ?CV: Denies chest pain, heart palpitations, peripheral edema, syncope.  ?Resp: Denies shortness of breath at rest or with exertion. Denies wheezing or cough.  ?GI: see HPI ?GU : Denies urinary burning, urinary frequency, urinary hesitancy ?MS: Denies joint pain, muscle weakness, cramps, or limitation of movement.  ?Derm: Denies rash, itching, dry skin ?Psych: Denies depression, anxiety, memory loss, and confusion ?Heme: Denies bruising, bleeding, and enlarged lymph nodes. ? ? ?Physical Exam  ? ?BP 106/72   Pulse (!) 110   Temp (!) 97.3 ?F (36.3 ?C)   Ht '5\' 4"'$  (1.626 m)   Wt 193 lb 6.4 oz (87.7 kg)   BMI 33.20 kg/m?  ?General:   Alert and oriented. Pleasant and cooperative. Well-nourished and well-developed.  ?Head:  Normocephalic and atraumatic. ?Eyes:  Without icterus ?Rectal:  no  perianal lesions. Internal exam with prominent left lateral column.  ?Msk:  Symmetrical without gross deformities. Normal posture. ?Extremities:  Without edema. ?Neurologic:  Alert and  oriented x4;  grossly normal

## 2021-12-31 ENCOUNTER — Ambulatory Visit (HOSPITAL_COMMUNITY)
Admission: RE | Admit: 2021-12-31 | Discharge: 2021-12-31 | Disposition: A | Discharge status: Home / Self Care | Payer: BC Managed Care – PPO | Source: Ambulatory Visit | Attending: Internal Medicine | Admitting: Internal Medicine

## 2021-12-31 ENCOUNTER — Other Ambulatory Visit (HOSPITAL_COMMUNITY): Payer: Self-pay | Admitting: Internal Medicine

## 2021-12-31 DIAGNOSIS — R051 Acute cough: Secondary | ICD-10-CM

## 2021-12-31 DIAGNOSIS — R059 Cough, unspecified: Secondary | ICD-10-CM | POA: Diagnosis not present

## 2022-01-13 ENCOUNTER — Other Ambulatory Visit: Payer: Self-pay | Admitting: Adult Health

## 2022-03-09 ENCOUNTER — Encounter: Payer: Self-pay | Admitting: Physician Assistant

## 2022-03-09 ENCOUNTER — Ambulatory Visit: Payer: BC Managed Care – PPO | Admitting: Physician Assistant

## 2022-03-09 DIAGNOSIS — L720 Epidermal cyst: Secondary | ICD-10-CM

## 2022-03-09 DIAGNOSIS — Z85828 Personal history of other malignant neoplasm of skin: Secondary | ICD-10-CM

## 2022-03-09 DIAGNOSIS — Z1283 Encounter for screening for malignant neoplasm of skin: Secondary | ICD-10-CM | POA: Diagnosis not present

## 2022-03-09 DIAGNOSIS — L659 Nonscarring hair loss, unspecified: Secondary | ICD-10-CM | POA: Diagnosis not present

## 2022-03-09 DIAGNOSIS — Z86018 Personal history of other benign neoplasm: Secondary | ICD-10-CM

## 2022-03-09 DIAGNOSIS — L570 Actinic keratosis: Secondary | ICD-10-CM

## 2022-03-09 MED ORDER — FLUOROURACIL 5 % EX CREA: TOPICAL_CREAM | CUTANEOUS | 1 refills | Status: DC

## 2022-03-11 DIAGNOSIS — L659 Nonscarring hair loss, unspecified: Secondary | ICD-10-CM | POA: Diagnosis not present

## 2022-03-15 ENCOUNTER — Encounter: Payer: Self-pay | Admitting: Physician Assistant

## 2022-03-15 NOTE — Progress Notes (Signed)
   Follow-Up Visit   Subjective  Erica Lynch is a 61 y.o. female who presents for the following: Annual Exam (Patient here today for yearly skin check, per patient she has redness on her face x 6 months per patient it looks like she has a sunburn. No itching no pain just redness, patient states that everything she washes her face makes it worse. Personal history of atypical moles and non mole skin cancer. No family history of atypical moles, melanoma or non mole skin cancer. ). She is very stressed and is going through menopause currently. Just recently started to fall out more than usual. No pain, redness or scale.   The following portions of the chart were reviewed this encounter and updated as appropriate:  Tobacco  Allergies  Meds  Problems  Med Hx  Surg Hx  Fam Hx      Objective  Well appearing patient in no apparent distress; mood and affect are within normal limits.  A full examination was performed including scalp, head, eyes, ears, nose, lips, neck, chest, axillae, abdomen, back, buttocks, bilateral upper extremities, bilateral lower extremities, hands, feet, fingers, toes, fingernails, and toenails. All findings within normal limits unless otherwise noted below.  Full body skin examination- No atypical nevi or signs of NMSC noted at the time of the visit.   Mid Parietal Scalp Diffuse hair loss. Negative hair pull.   Chest - Medial [facility]), Left Malar Cheek, Right Malar Cheek Erythematous patches with gritty scale.   Assessment & Plan  Encounter for screening for malignant neoplasm of skin  Yearly skin examination   Alopecia Mid Parietal Scalp  Otc Minoxidil 5 % topical  AK (actinic keratosis) (3) Chest - Medial (Center); Left Malar Cheek; Right Malar Cheek  Fluorouracil 5% cream this winter  fluorouracil (EFUDEX) 5 % cream - Chest - Medial (Center), Left Malar Cheek, Right Malar Cheek Apply to affected qhs Monday - Sunday x 2 week- avoid sunlight and  expect irritation    I, Adilenne Ashworth, PA-C, have reviewed all documentation's for this visit.  The documentation on 03/15/22 for the exam, diagnosis, procedures and orders are all accurate and complete.

## 2022-04-21 ENCOUNTER — Ambulatory Visit: Payer: BC Managed Care – PPO | Admitting: Orthopedic Surgery

## 2022-05-18 ENCOUNTER — Other Ambulatory Visit (HOSPITAL_COMMUNITY): Payer: Self-pay | Admitting: Adult Health

## 2022-05-18 DIAGNOSIS — Z1231 Encounter for screening mammogram for malignant neoplasm of breast: Secondary | ICD-10-CM

## 2022-06-10 ENCOUNTER — Ambulatory Visit (HOSPITAL_COMMUNITY)
Admission: RE | Admit: 2022-06-10 | Discharge: 2022-06-10 | Disposition: A | Discharge status: Home / Self Care | Payer: BC Managed Care – PPO | Source: Ambulatory Visit | Attending: Adult Health | Admitting: Adult Health

## 2022-06-10 DIAGNOSIS — Z1231 Encounter for screening mammogram for malignant neoplasm of breast: Secondary | ICD-10-CM | POA: Diagnosis not present

## 2022-06-14 DIAGNOSIS — Z23 Encounter for immunization: Secondary | ICD-10-CM | POA: Diagnosis not present

## 2022-07-26 ENCOUNTER — Other Ambulatory Visit: Payer: Self-pay | Admitting: Gastroenterology

## 2022-07-26 NOTE — Telephone Encounter (Signed)
Pt wants a RF on her Dexlansoprazole 60 mg tab. Last ov was 12-22-2021 with you.

## 2022-07-28 ENCOUNTER — Ambulatory Visit: Payer: BC Managed Care – PPO | Admitting: Adult Health

## 2022-07-28 DIAGNOSIS — R519 Headache, unspecified: Secondary | ICD-10-CM | POA: Diagnosis not present

## 2022-07-28 DIAGNOSIS — F411 Generalized anxiety disorder: Secondary | ICD-10-CM | POA: Diagnosis not present

## 2022-07-30 ENCOUNTER — Ambulatory Visit: Payer: BC Managed Care – PPO | Admitting: Adult Health

## 2022-08-13 ENCOUNTER — Ambulatory Visit: Payer: BC Managed Care – PPO | Admitting: Adult Health

## 2022-10-01 ENCOUNTER — Ambulatory Visit (INDEPENDENT_AMBULATORY_CARE_PROVIDER_SITE_OTHER): Payer: BC Managed Care – PPO | Admitting: Adult Health

## 2022-10-01 ENCOUNTER — Encounter: Payer: Self-pay | Admitting: Adult Health

## 2022-10-01 VITALS — BP 123/79 | HR 99 | Ht 64.0 in | Wt 201.0 lb

## 2022-10-01 DIAGNOSIS — Z01419 Encounter for gynecological examination (general) (routine) without abnormal findings: Secondary | ICD-10-CM

## 2022-10-01 DIAGNOSIS — N952 Postmenopausal atrophic vaginitis: Secondary | ICD-10-CM | POA: Diagnosis not present

## 2022-10-01 DIAGNOSIS — Z1211 Encounter for screening for malignant neoplasm of colon: Secondary | ICD-10-CM

## 2022-10-01 LAB — HEMOCCULT GUIAC POC 1CARD (OFFICE): Fecal Occult Blood, POC: NEGATIVE

## 2022-10-01 MED ORDER — ESTRADIOL 0.1 MG/GM VA CREA
TOPICAL_CREAM | VAGINAL | 1 refills | Status: DC
Start: 2022-10-01 — End: 2023-04-19

## 2022-10-01 NOTE — Progress Notes (Signed)
Patient ID: LYDA COLCORD, female   DOB: Jun 02, 1961, 62 y.o.   MRN: [id number] History of Present Illness: [name] is a 62 year old white female, married, PM in for well woman gyn exam. She has gone PT at work.  Last pap was negative for HPV and NILM 07/06/21.  PCP is Dr Willey Blade.   Current Medications, Allergies, Past Medical History, Past Surgical History, Family History and Social History were reviewed in [employer] record.     Review of Systems: Patient denies any headaches, hearing loss, fatigue, blurred vision, shortness of breath, chest pain, abdominal pain, problems with bowel movements, urination, or intercourse(not active). No joint pain or mood swings.  Has stress, in laws both have dementia.   Physical Exam:BP 123/79 (BP Location: Left Arm, Patient Position: Sitting, Cuff Size: Normal)   Pulse 99   Ht '5\' 4"'$  (1.626 m)   Wt 201 lb (91.2 kg)   BMI 34.50 kg/m   General:  Well developed, well nourished, no acute distress Skin:  Warm and dry Neck:  Midline trachea, normal thyroid, good ROM, no lymphadenopathy,no carotid bruits heard Lungs; Clear to auscultation bilaterally Breast:  No dominant palpable mass, retraction, or nipple discharge Cardiovascular: Regular rate and rhythm Abdomen:  Soft, non tender, no hepatosplenomegaly Pelvic:  External genitalia is normal in appearance, no lesions.  The vagina is atrophic. Urethra has no lesions or masses. The cervix is smooth.  Uterus is felt to be normal size, shape, and contour.  No adnexal masses or tenderness noted.Bladder is non tender, no masses felt. Rectal: Good sphincter tone, no polyps, or hemorrhoids felt.  Hemoccult negative. Extremities/musculoskeletal:  No swelling or varicosities noted, no clubbing or cyanosis Psych:  No mood changes, alert and cooperative,seems happy AA is 0 Fall risk is low    10/01/2022    9:54 AM 07/06/2021    3:38 PM 07/02/2020    8:36 AM  Depression screen PHQ 2/9   Decreased Interest 1 0 1  Down, Depressed, Hopeless '1 1 1  '$ PHQ - 2 Score '2 1 2  '$ Altered sleeping '1 1 1  '$ Tired, decreased energy '1 1 1  '$ Change in appetite 1 1 0  Feeling bad or failure about yourself  0 0 0  Trouble concentrating 1 0 0  Moving slowly or fidgety/restless 0 0 0  Suicidal thoughts 0 0 0  PHQ-9 Score '6 4 4  '$ Difficult doing work/chores   Not difficult at all       10/01/2022    9:55 AM 07/06/2021    3:38 PM 07/02/2020    8:36 AM 12/07/2017    8:23 AM  GAD 7 : Generalized Anxiety Score  Nervous, Anxious, on Edge '1 1 1   '$ Control/stop worrying '1 1 1   '$ Worry too much - different things '1 1 1   '$ Trouble relaxing '1 1 1   '$ Restless 0 0 0   Easily annoyed or irritable '1 1 1   '$ Afraid - awful might happen 0 0 0   Total GAD 7 Score '5 5 5   '$ Anxiety Difficulty   Not difficult at all      Information is confidential and restricted. Go to Review Flowsheets to unlock data.      Upstream - 10/01/22 0954       Pregnancy Intention Screening   Does the patient want to become pregnant in the next year? N/A    Does the patient's partner want to become pregnant in the next  year? N/A    Would the patient like to discuss contraceptive options today? N/A      Contraception Wrap Up   Current Method Female Sterilization   post menopausal   End Method Female Sterilization   post menopausal   Contraception Counseling Provided No            Examination chaperoned by Dewitt Hoes RN  Impression and Plan: 1. Encounter for well woman exam with routine gynecological exam Pap and physical in 1 year Mammogram was negative 06/10/22 Colonoscopy perGI Labs with PCP  2. Encounter for screening fecal occult blood testing Hemoccult was negative   3. Vaginal atrophy Refilled estrace vaginal cream Meds ordered this encounter  Medications   estradiol (ESTRACE) 0.1 MG/GM vaginal cream    Sig: Use 1 gm in vagina 2 x weekly    Dispense:  42.5 g    Refill:  1    Order Specific Question:    Supervising Provider    Answer:   Tania Ade H [[id number]]

## 2022-11-02 DIAGNOSIS — F411 Generalized anxiety disorder: Secondary | ICD-10-CM | POA: Diagnosis not present

## 2022-11-02 DIAGNOSIS — G4452 New daily persistent headache (NDPH): Secondary | ICD-10-CM | POA: Diagnosis not present

## 2022-11-17 ENCOUNTER — Encounter: Payer: Self-pay | Admitting: Internal Medicine

## 2022-11-25 ENCOUNTER — Encounter: Payer: Self-pay | Admitting: Radiology

## 2022-11-25 DIAGNOSIS — D225 Melanocytic nevi of trunk: Secondary | ICD-10-CM | POA: Diagnosis not present

## 2022-11-25 DIAGNOSIS — Z1283 Encounter for screening for malignant neoplasm of skin: Secondary | ICD-10-CM | POA: Diagnosis not present

## 2022-11-25 DIAGNOSIS — D2261 Melanocytic nevi of right upper limb, including shoulder: Secondary | ICD-10-CM | POA: Diagnosis not present

## 2022-11-25 DIAGNOSIS — D485 Neoplasm of uncertain behavior of skin: Secondary | ICD-10-CM | POA: Diagnosis not present

## 2022-11-25 DIAGNOSIS — L650 Telogen effluvium: Secondary | ICD-10-CM | POA: Diagnosis not present

## 2023-01-13 ENCOUNTER — Ambulatory Visit (INDEPENDENT_AMBULATORY_CARE_PROVIDER_SITE_OTHER): Payer: BC Managed Care – PPO | Admitting: Gastroenterology

## 2023-01-13 ENCOUNTER — Encounter: Payer: Self-pay | Admitting: Gastroenterology

## 2023-01-13 VITALS — BP 127/80 | HR 101 | Temp 97.7°F | Ht 64.0 in | Wt 199.7 lb

## 2023-01-13 DIAGNOSIS — R1012 Left upper quadrant pain: Secondary | ICD-10-CM | POA: Diagnosis not present

## 2023-01-13 NOTE — Patient Instructions (Signed)
Please have blood work done when you do Dr. Alonza Smoker blood work.  Please message me if anything changes or worsens. We will do imaging if that happens!  Further recommendations after the blood work!  Have a great time celebrating your son's birthday!  I enjoyed seeing you again today! At our first visit, I mentioned how I value our relationship and want to provide genuine, compassionate, and quality care. You may receive a survey regarding your visit with me, and I welcome your feedback! Thanks so much for taking the time to complete this. I look forward to seeing you again.   Gelene Mink, PhD, ANP-BC [facility]

## 2023-01-13 NOTE — Progress Notes (Signed)
Gastroenterology Office Note     Primary Care Physician:  Carylon Perches, MD  Primary Gastroenterologist: Dr. Jena Gauss    Chief Complaint   Chief Complaint  Patient presents with   Follow-up    Here for follow up on Reflux, having some left lower quadrant pain.     History of Present Illness   Erica Lynch is a 62 y.o. female presenting today in follow-up with a history of GERD, epigastric pain, now with LUQ abdominal pain. She was last seen in March 2023.    Past few weeks has had vague LUQ abdominal pain. Drinks water or salad might make worse. No nausea, dysphagia, lower GI symptoms. No overt GI bleeding. No weight loss or lack of appetite. No NSAIDs. Dexilant daily. No worsening with movement.    EGD 2022:mild Schatzki ring, medium-sized hiatal hernia, duodenum normal, s/p dilation  Colonoscopy 2022: hyperplastic polyp.    Past Medical History:  Diagnosis Date   Anxiety    Atypical mole 04/03/1996   moderate atypia - upper anterior left thigh (no treatment)   Atypical mole 09/11/1996   slight to moderate atypia - left buttocks (no treatment)   Atypical mole 11/19/1996   slight atypia - mid left paraspinal (no treatment)   Atypical mole 04/04/2001   slight atypia - right scapula (no treatment)   Atypical mole 12/22/2001   mild atypia - right mid calf (no treatment)   Atypical mole 12/22/2001   moderate atypia - right anterior neck (widershave)   Atypical mole 11/23/2007   moderate atypia - right upper back (widershave)   Atypical mole 07/21/2011   mild atypia - right upper inner thigh   Atypical mole 07/21/2011   mild atypia - right calf   Atypical mole 02/04/2016   moderate atypia - left calf (widershave)   Depression    Fatigue 12/04/2015   GERD (gastroesophageal reflux disease)    Hematuria 05/22/2014   History of breast lump 02/19/2014   Hot flashes 12/04/2015   Hyperlipidemia    IBS (irritable bowel syndrome)    Left breast mass 11/07/2013   LLQ pain  05/22/2014   Lumbar herniated disc November 2016   Other and unspecified ovarian cyst 05/23/2014   Squamous cell carcinoma of skin 12/22/2001   right inner cheek - CX3 + excision (01/26/2002)   Squamous cell carcinoma of skin 12/22/2001   left outer cheek - CX (02/23/2002)   Squamous cell carcinoma of skin 07/13/2017   right chest - tx p bx   Unspecified symptom associated with female genital organs 05/22/2014   Vitamin D deficiency 12/05/2015    Past Surgical History:  Procedure Laterality Date   CESAREAN SECTION  1996   COLONOSCOPY  08/2011   Dr. Jena Gauss: normal, next colonoscopy December 2022.   COLONOSCOPY WITH PROPOFOL N/A 09/07/2021   non-bleeding internal hemorrhoids, one 5 mm polyp in sigmoid, sigmoid and descending colon diverticulosis. Hyperplastic polyp.   ENDOMETRIAL ABLATION  2-3 yrs ago   [facility]   ESOPHAGOGASTRODUODENOSCOPY (EGD) WITH PROPOFOL N/A 11/21/2017   Dr. Jena Gauss: Normal esophagus, status post dilation, small hiatal hernia   ESOPHAGOGASTRODUODENOSCOPY (EGD) WITH PROPOFOL N/A 09/07/2021   mild Schatzki ring, medium-sized hiatal hernia, duodenum normal, s/p dilation.   MALONEY DILATION N/A 11/21/2017   Procedure: Elease Hashimoto DILATION;  Surgeon: Corbin Ade, MD;  [facility]: [facility];  Service: Endoscopy;  Laterality: N/A;   MALONEY DILATION  09/07/2021   Procedure: Elease Hashimoto DILATION;  Surgeon: Corbin Ade, MD;  Location:  [facility];  Service: Endoscopy;;   OTHER SURGICAL HISTORY     Uterine ablation   POLYPECTOMY  09/07/2021   Procedure: POLYPECTOMY;  Surgeon: Corbin Ade, MD;  Location: [facility];  Service: Endoscopy;;   TUBAL LIGATION  6 yrs ago   Emelda Fear    Current Outpatient Medications  Medication Sig Dispense Refill   buPROPion (WELLBUTRIN XL) 300 MG 24 hr tablet Take 450 mg by mouth daily.     clonazePAM (KLONOPIN) 1 MG tablet Take 1 tablet (1 mg total) by mouth 2 (two) times daily as needed for anxiety. (Patient taking differently:  Take 1 mg by mouth at bedtime.) 60 tablet 5   dexlansoprazole (DEXILANT) 60 MG capsule TAKE (1) CAPSULE BY MOUTH ONCE DAILY. 90 capsule 3   estradiol (ESTRACE) 0.1 MG/GM vaginal cream Use 1 gm in vagina 2 x weekly 42.5 g 1   Multiple Vitamin (MULTIVITAMIN) capsule Take 1 capsule by mouth daily.     topiramate (TOPAMAX) 100 MG tablet Take 100 mg by mouth daily.     No current facility-administered medications for this visit.    Allergies as of 01/13/2023   (No Known Allergies)    Family History  Problem Relation Age of Onset   Lung cancer Mother        deceased from lung cancer at age 1   Breast cancer Mother    Cancer Mother        breast at age 85   Depression Mother    Cirrhosis Father        etoh    Anxiety disorder Father    Alcohol abuse Father    Depression Father    Diabetes Maternal Grandfather    Heart disease Maternal Grandfather    Depression Maternal Grandmother    Depression Maternal Uncle    Depression Maternal Uncle    Anesthesia problems Neg Hx    Hypotension Neg Hx    Malignant hyperthermia Neg Hx    Pseudochol deficiency Neg Hx    Colon cancer Neg Hx    Colon polyps Neg Hx     Social History   Socioeconomic History   Marital status: Married    Spouse name: Not on file   Number of children: 1   Years of education: Not on file   Highest education level: Not on file  Occupational History   Occupation: Librarian, academic: [employer]  Tobacco Use   Smoking status: Never   Smokeless tobacco: Never  Vaping Use   Vaping Use: Never used  Substance and Sexual Activity   Alcohol use: Yes    Comment: rarely   Drug use: No   Sexual activity: Not Currently    Birth control/protection: Surgical, Post-menopausal    Comment: tubal and ablation  Other Topics Concern   Not on file  Social History Narrative   1 son-16         Social Determinants of Health   Financial Resource Strain: Low Risk  (10/01/2022)   Overall Financial  Resource Strain (CARDIA)    Difficulty of Paying Living Expenses: Not very hard  Food Insecurity: No Food Insecurity (10/01/2022)   Hunger Vital Sign    Worried About Running Out of Food in the Last Year: Never true    Ran Out of Food in the Last Year: Never true  Transportation Needs: No Transportation Needs (10/01/2022)   PRAPARE - Administrator, Civil Service (Medical):  No    Lack of Transportation (Non-Medical): No  Physical Activity: Insufficiently Active (10/01/2022)   Exercise Vital Sign    Days of Exercise per Week: 1 day    Minutes of Exercise per Session: 20 min  Stress: Stress Concern Present (10/01/2022)   [employer] of Occupational Health - Occupational Stress Questionnaire    Feeling of Stress : To some extent  Social Connections: Moderately Integrated (10/01/2022)   Social Connection and Isolation Panel [NHANES]    Frequency of Communication with Friends and Family: More than three times a week    Frequency of Social Gatherings with Friends and Family: Once a week    Attends Religious Services: More than 4 times per year    Active Member of [employer] or Organizations: No    Attends Banker Meetings: Patient declined    Marital Status: Married  Catering manager Violence: Not At Risk (10/01/2022)   Humiliation, Afraid, Rape, and Kick questionnaire    Fear of Current or Ex-Partner: No    Emotionally Abused: No    Physically Abused: No    Sexually Abused: No     Review of Systems   Gen: Denies any fever, chills, fatigue, weight loss, lack of appetite.  CV: Denies chest pain, heart palpitations, peripheral edema, syncope.  Resp: Denies shortness of breath at rest or with exertion. Denies wheezing or cough.  GI: Denies dysphagia or odynophagia. Denies jaundice, hematemesis, fecal incontinence. GU : Denies urinary burning, urinary frequency, urinary hesitancy MS: Denies joint pain, muscle weakness, cramps, or limitation of movement.  Derm: Denies  rash, itching, dry skin Psych: Denies depression, anxiety, memory loss, and confusion Heme: Denies bruising, bleeding, and enlarged lymph nodes.   Physical Exam   BP 127/80 (BP Location: Right Arm, Patient Position: Sitting, Cuff Size: Large)   Pulse (!) 101   Temp 97.7 F (36.5 C) (Oral)   Ht  (1.626 m)   Wt 199 lb 11.2 oz (90.6 kg)   SpO2 96%   BMI 34.28 kg/m  General:   Alert and oriented. Pleasant and cooperative. Well-nourished and well-developed.  Head:  Normocephalic and atraumatic. Eyes:  Without icterus Abdomen:  +BS, soft, non-tender and non-distended. No HSM noted. No guarding or rebound. No masses appreciated.  Rectal:  Deferred  Msk:  Symmetrical without gross deformities. Normal posture. Extremities:  Without edema. Neurologic:  Alert and  oriented x4;  grossly normal neurologically. Skin:  Intact without significant lesions or rashes. Psych:  Alert and cooperative. Normal mood and affect.   Assessment   Erica Lynch is a delightful 62 y.o. female presenting today in follow-up with a history of GERD,  now with LUQ abdominal pain. She was last seen in March 2023.   LUQ vague discomfort for past few weeks without any other associated signs/symptoms. No alarm signs/symptoms. EGD 2022 with medium-sized hiatal hernia, normal duodenum, dilation of mild Schatzki ring.  We discussed checking labs: she will be doing this in next few weeks for yearly check-up with Dr. Ouida Sills. I have ordered labs that we would need.  If anything worsens, we will pursue a CT.      PLAN   Message if worsening symptoms CBC, CMP, lipase Further recommendations after labs Continue Dexilant daily   Gelene Mink, PhD, [facility]

## 2023-01-21 DIAGNOSIS — R519 Headache, unspecified: Secondary | ICD-10-CM | POA: Diagnosis not present

## 2023-01-21 DIAGNOSIS — H524 Presbyopia: Secondary | ICD-10-CM | POA: Diagnosis not present

## 2023-01-26 DIAGNOSIS — R1012 Left upper quadrant pain: Secondary | ICD-10-CM | POA: Diagnosis not present

## 2023-01-26 DIAGNOSIS — Z79899 Other long term (current) drug therapy: Secondary | ICD-10-CM | POA: Diagnosis not present

## 2023-01-26 DIAGNOSIS — F419 Anxiety disorder, unspecified: Secondary | ICD-10-CM | POA: Diagnosis not present

## 2023-01-26 DIAGNOSIS — G4452 New daily persistent headache (NDPH): Secondary | ICD-10-CM | POA: Diagnosis not present

## 2023-01-27 LAB — LIPASE: Lipase: 28 U/L (ref 14–72)

## 2023-01-28 ENCOUNTER — Telehealth: Payer: Self-pay

## 2023-01-28 NOTE — Telephone Encounter (Signed)
Outside labs are in box for review. 

## 2023-02-02 DIAGNOSIS — R519 Headache, unspecified: Secondary | ICD-10-CM | POA: Diagnosis not present

## 2023-02-02 DIAGNOSIS — N289 Disorder of kidney and ureter, unspecified: Secondary | ICD-10-CM | POA: Diagnosis not present

## 2023-02-10 NOTE — Telephone Encounter (Signed)
Reviewed and addressed under "lipase" result.

## 2023-02-23 DIAGNOSIS — H18211 Corneal edema secondary to contact lens, right eye: Secondary | ICD-10-CM | POA: Diagnosis not present

## 2023-02-25 DIAGNOSIS — H16141 Punctate keratitis, right eye: Secondary | ICD-10-CM | POA: Diagnosis not present

## 2023-04-19 ENCOUNTER — Other Ambulatory Visit: Payer: Self-pay | Admitting: Adult Health

## 2023-04-27 DIAGNOSIS — N289 Disorder of kidney and ureter, unspecified: Secondary | ICD-10-CM | POA: Diagnosis not present

## 2023-04-27 DIAGNOSIS — R519 Headache, unspecified: Secondary | ICD-10-CM | POA: Diagnosis not present

## 2023-04-27 DIAGNOSIS — F419 Anxiety disorder, unspecified: Secondary | ICD-10-CM | POA: Diagnosis not present

## 2023-04-27 DIAGNOSIS — Z79899 Other long term (current) drug therapy: Secondary | ICD-10-CM | POA: Diagnosis not present

## 2023-04-27 DIAGNOSIS — R944 Abnormal results of kidney function studies: Secondary | ICD-10-CM | POA: Diagnosis not present

## 2023-05-04 DIAGNOSIS — N289 Disorder of kidney and ureter, unspecified: Secondary | ICD-10-CM | POA: Diagnosis not present

## 2023-05-04 DIAGNOSIS — R519 Headache, unspecified: Secondary | ICD-10-CM | POA: Diagnosis not present

## 2023-05-26 DIAGNOSIS — H0288B Meibomian gland dysfunction left eye, upper and lower eyelids: Secondary | ICD-10-CM | POA: Diagnosis not present

## 2023-06-09 ENCOUNTER — Other Ambulatory Visit (HOSPITAL_COMMUNITY): Payer: Self-pay | Admitting: Adult Health

## 2023-06-09 DIAGNOSIS — Z1231 Encounter for screening mammogram for malignant neoplasm of breast: Secondary | ICD-10-CM

## 2023-06-20 ENCOUNTER — Ambulatory Visit (HOSPITAL_COMMUNITY)
Admission: RE | Admit: 2023-06-20 | Discharge: 2023-06-20 | Disposition: A | Discharge status: Home / Self Care | Payer: BC Managed Care – PPO | Source: Ambulatory Visit | Attending: Adult Health | Admitting: Adult Health

## 2023-06-20 DIAGNOSIS — Z1231 Encounter for screening mammogram for malignant neoplasm of breast: Secondary | ICD-10-CM | POA: Diagnosis not present

## 2023-08-03 DIAGNOSIS — N189 Chronic kidney disease, unspecified: Secondary | ICD-10-CM | POA: Diagnosis not present

## 2023-08-10 DIAGNOSIS — N1831 Chronic kidney disease, stage 3a: Secondary | ICD-10-CM | POA: Diagnosis not present

## 2023-08-10 DIAGNOSIS — F411 Generalized anxiety disorder: Secondary | ICD-10-CM | POA: Diagnosis not present

## 2023-08-30 DIAGNOSIS — D485 Neoplasm of uncertain behavior of skin: Secondary | ICD-10-CM | POA: Diagnosis not present

## 2023-08-30 DIAGNOSIS — B078 Other viral warts: Secondary | ICD-10-CM | POA: Diagnosis not present

## 2023-08-30 DIAGNOSIS — D0462 Carcinoma in situ of skin of left upper limb, including shoulder: Secondary | ICD-10-CM | POA: Diagnosis not present

## 2023-08-30 DIAGNOSIS — L568 Other specified acute skin changes due to ultraviolet radiation: Secondary | ICD-10-CM | POA: Diagnosis not present

## 2023-08-30 DIAGNOSIS — L718 Other rosacea: Secondary | ICD-10-CM | POA: Diagnosis not present

## 2023-10-04 DIAGNOSIS — L648 Other androgenic alopecia: Secondary | ICD-10-CM | POA: Diagnosis not present

## 2023-10-04 DIAGNOSIS — B078 Other viral warts: Secondary | ICD-10-CM | POA: Diagnosis not present

## 2023-10-04 DIAGNOSIS — R208 Other disturbances of skin sensation: Secondary | ICD-10-CM | POA: Diagnosis not present

## 2023-10-04 DIAGNOSIS — D0462 Carcinoma in situ of skin of left upper limb, including shoulder: Secondary | ICD-10-CM | POA: Diagnosis not present

## 2023-10-04 DIAGNOSIS — L538 Other specified erythematous conditions: Secondary | ICD-10-CM | POA: Diagnosis not present

## 2023-10-04 DIAGNOSIS — L718 Other rosacea: Secondary | ICD-10-CM | POA: Diagnosis not present

## 2023-11-04 ENCOUNTER — Ambulatory Visit: Payer: BC Managed Care – PPO | Admitting: Adult Health

## 2023-11-04 DIAGNOSIS — N1831 Chronic kidney disease, stage 3a: Secondary | ICD-10-CM | POA: Diagnosis not present

## 2023-11-04 DIAGNOSIS — Z79899 Other long term (current) drug therapy: Secondary | ICD-10-CM | POA: Diagnosis not present

## 2023-11-04 DIAGNOSIS — F419 Anxiety disorder, unspecified: Secondary | ICD-10-CM | POA: Diagnosis not present

## 2023-11-11 DIAGNOSIS — Z713 Dietary counseling and surveillance: Secondary | ICD-10-CM | POA: Diagnosis not present

## 2023-11-15 DIAGNOSIS — N1831 Chronic kidney disease, stage 3a: Secondary | ICD-10-CM | POA: Diagnosis not present

## 2023-11-15 DIAGNOSIS — R51 Headache with orthostatic component, not elsewhere classified: Secondary | ICD-10-CM | POA: Diagnosis not present

## 2023-11-16 ENCOUNTER — Other Ambulatory Visit (HOSPITAL_COMMUNITY): Payer: Self-pay | Admitting: Internal Medicine

## 2023-11-16 DIAGNOSIS — N183 Chronic kidney disease, stage 3 unspecified: Secondary | ICD-10-CM

## 2023-11-16 DIAGNOSIS — R7989 Other specified abnormal findings of blood chemistry: Secondary | ICD-10-CM

## 2023-11-18 DIAGNOSIS — Z713 Dietary counseling and surveillance: Secondary | ICD-10-CM | POA: Diagnosis not present

## 2023-11-21 ENCOUNTER — Ambulatory Visit (HOSPITAL_COMMUNITY)
Admission: RE | Admit: 2023-11-21 | Discharge: 2023-11-21 | Disposition: A | Discharge status: Home / Self Care | Payer: BC Managed Care – PPO | Source: Ambulatory Visit | Attending: Internal Medicine | Admitting: Internal Medicine

## 2023-11-21 DIAGNOSIS — R7989 Other specified abnormal findings of blood chemistry: Secondary | ICD-10-CM | POA: Diagnosis not present

## 2023-11-21 DIAGNOSIS — N183 Chronic kidney disease, stage 3 unspecified: Secondary | ICD-10-CM | POA: Insufficient documentation

## 2023-11-21 DIAGNOSIS — I129 Hypertensive chronic kidney disease with stage 1 through stage 4 chronic kidney disease, or unspecified chronic kidney disease: Secondary | ICD-10-CM | POA: Insufficient documentation

## 2023-11-21 DIAGNOSIS — N189 Chronic kidney disease, unspecified: Secondary | ICD-10-CM | POA: Diagnosis not present

## 2023-11-23 DIAGNOSIS — D0462 Carcinoma in situ of skin of left upper limb, including shoulder: Secondary | ICD-10-CM | POA: Diagnosis not present

## 2023-11-23 DIAGNOSIS — L718 Other rosacea: Secondary | ICD-10-CM | POA: Diagnosis not present

## 2023-11-23 DIAGNOSIS — L988 Other specified disorders of the skin and subcutaneous tissue: Secondary | ICD-10-CM | POA: Diagnosis not present

## 2023-11-25 DIAGNOSIS — Z713 Dietary counseling and surveillance: Secondary | ICD-10-CM | POA: Diagnosis not present

## 2023-12-02 DIAGNOSIS — Z713 Dietary counseling and surveillance: Secondary | ICD-10-CM | POA: Diagnosis not present

## 2023-12-08 DIAGNOSIS — L821 Other seborrheic keratosis: Secondary | ICD-10-CM | POA: Diagnosis not present

## 2023-12-08 DIAGNOSIS — Z85828 Personal history of other malignant neoplasm of skin: Secondary | ICD-10-CM | POA: Diagnosis not present

## 2023-12-08 DIAGNOSIS — Z08 Encounter for follow-up examination after completed treatment for malignant neoplasm: Secondary | ICD-10-CM | POA: Diagnosis not present

## 2023-12-08 DIAGNOSIS — L718 Other rosacea: Secondary | ICD-10-CM | POA: Diagnosis not present

## 2023-12-09 ENCOUNTER — Ambulatory Visit: Payer: BC Managed Care – PPO | Admitting: Adult Health

## 2023-12-09 ENCOUNTER — Encounter: Payer: Self-pay | Admitting: Adult Health

## 2023-12-09 ENCOUNTER — Other Ambulatory Visit (HOSPITAL_COMMUNITY)
Admission: RE | Admit: 2023-12-09 | Discharge: 2023-12-09 | Disposition: A | Discharge status: Home / Self Care | Source: Ambulatory Visit | Attending: Adult Health | Admitting: Adult Health

## 2023-12-09 VITALS — BP 137/86 | HR 88 | Ht 64.0 in | Wt 199.5 lb

## 2023-12-09 DIAGNOSIS — Z713 Dietary counseling and surveillance: Secondary | ICD-10-CM | POA: Diagnosis not present

## 2023-12-09 DIAGNOSIS — Z01419 Encounter for gynecological examination (general) (routine) without abnormal findings: Secondary | ICD-10-CM

## 2023-12-09 DIAGNOSIS — Z1331 Encounter for screening for depression: Secondary | ICD-10-CM | POA: Diagnosis not present

## 2023-12-09 DIAGNOSIS — Z1211 Encounter for screening for malignant neoplasm of colon: Secondary | ICD-10-CM | POA: Diagnosis not present

## 2023-12-09 LAB — HEMOCCULT GUIAC POC 1CARD (OFFICE): Fecal Occult Blood, POC: NEGATIVE

## 2023-12-09 MED ORDER — ESTRADIOL 0.1 MG/GM VA CREA: TOPICAL_CREAM | VAGINAL | 1 refills | Status: AC

## 2023-12-09 NOTE — Progress Notes (Signed)
 Patient ID: Erica Lynch, female   DOB: Feb 16, 1961, 63 y.o.   MRN: [id number] History of Present Illness: Erica Lynch is a 63 year old white female, married, PM in for a well woman gyn exam and pap. She says her creatinine has been elevated and has fatty liver now too, she has lost 7 lbs and seeing dietician. She brought copy of labs.   PCP is Dr Ouida Sills   Current Medications, Allergies, Past Medical History, Past Surgical History, Family History and Social History were reviewed in [employer] electronic medical record.     Review of Systems: Patient denies any headaches, hearing loss, fatigue, blurred vision, shortness of breath, chest pain, abdominal pain, problems with bowel movements, urination, or intercourse.(Not active). No joint pain or mood swings.  Denies any vaginal bleeding   Physical Exam:BP 137/86 (BP Location: Right Arm, Patient Position: Sitting, Cuff Size: Normal)   Pulse 88   Ht 5\' 4"  (1.626 m)   Wt 199 lb 8 oz (90.5 kg)   BMI 34.24 kg/m   General:  Well developed, well nourished, no acute distress Skin:  Warm and dry Neck:  Midline trachea, normal thyroid, good ROM, no lymphadenopathy,no carotid bruits heard  Lungs; Clear to auscultation bilaterally Breast:  No dominant palpable mass, retraction, or nipple discharge Cardiovascular: Regular rate and rhythm Abdomen:  Soft, non tender, no hepatosplenomegaly Pelvic:  External genitalia is normal in appearance, no lesions.  The vagina is atrophic. Urethra has no lesions or masses. The cervix is smooth, pap with HR HPV genotyping performed.  Uterus is felt to be normal size, shape, and contour.  No adnexal masses or tenderness noted.Bladder is non tender, no masses felt. Rectal: Good sphincter tone, no polyps, or hemorrhoids felt.  Hemoccult negative. Extremities/musculoskeletal:  No swelling or varicosities noted, no clubbing or cyanosis Psych:  No mood changes, alert and cooperative,seems happy AA is 0 Fall risk is  low    12/09/2023    9:27 AM 10/01/2022    9:54 AM 07/06/2021    3:38 PM  Depression screen PHQ 2/9  Decreased Interest 0 1 0  Down, Depressed, Hopeless 0 1 1  PHQ - 2 Score 0 2 1  Altered sleeping 1 1 1   Tired, decreased energy 0 1 1  Change in appetite 1 1 1   Feeling bad or failure about yourself  0 0 0  Trouble concentrating 0 1 0  Moving slowly or fidgety/restless 0 0 0  Suicidal thoughts 0 0 0  PHQ-9 Score 2 6 4        12/09/2023    9:27 AM 10/01/2022    9:55 AM 07/06/2021    3:38 PM 07/02/2020    8:36 AM  GAD 7 : Generalized Anxiety Score  Nervous, Anxious, on Edge 0 1 1 1   Control/stop worrying 0 1 1 1   Worry too much - different things 0 1 1 1   Trouble relaxing 0 1 1 1   Restless 0 0 0 0  Easily annoyed or irritable 1 1 1 1   Afraid - awful might happen 0 0 0 0  Total GAD 7 Score 1 5 5 5   Anxiety Difficulty    Not difficult at all    Upstream - 12/09/23 6578       Pregnancy Intention Screening   Does the patient want to become pregnant in the next year? N/A    Does the patient's partner want to become pregnant in the next year? N/A    Would the patient  like to discuss contraceptive options today? N/A      Contraception Wrap Up   Current Method Female Sterilization   pm   End Method Female Sterilization   pm   Contraception Counseling Provided No            Examination chaperoned by Malachy Mood LPN   Impression and plan: 1. Encounter for gynecological examination with Papanicolaou smear of cervix (Primary) Pap sent Pap in 3 years If normal Mammogram was negative 06/20/23 Colonoscopy per GI Labs with PCP - Cytology - PAP( [place]) Gave handout on fatty liver and diet   2. Encounter for screening fecal occult blood testing Hemoccult was negative

## 2023-12-13 LAB — CYTOLOGY - PAP
Comment: NEGATIVE
Diagnosis: NEGATIVE
High risk HPV: NEGATIVE

## 2023-12-16 DIAGNOSIS — Z713 Dietary counseling and surveillance: Secondary | ICD-10-CM | POA: Diagnosis not present

## 2024-01-06 DIAGNOSIS — Z713 Dietary counseling and surveillance: Secondary | ICD-10-CM | POA: Diagnosis not present

## 2024-01-20 DIAGNOSIS — Z713 Dietary counseling and surveillance: Secondary | ICD-10-CM | POA: Diagnosis not present

## 2024-01-25 ENCOUNTER — Encounter: Payer: Self-pay | Admitting: Gastroenterology

## 2024-01-25 ENCOUNTER — Ambulatory Visit (INDEPENDENT_AMBULATORY_CARE_PROVIDER_SITE_OTHER): Admitting: Gastroenterology

## 2024-01-25 VITALS — BP 113/79 | HR 87 | Temp 97.5°F | Ht 64.0 in | Wt 198.4 lb

## 2024-01-25 DIAGNOSIS — Z8379 Family history of other diseases of the digestive system: Secondary | ICD-10-CM

## 2024-01-25 DIAGNOSIS — Z713 Dietary counseling and surveillance: Secondary | ICD-10-CM | POA: Diagnosis not present

## 2024-01-25 DIAGNOSIS — K219 Gastro-esophageal reflux disease without esophagitis: Secondary | ICD-10-CM

## 2024-01-25 DIAGNOSIS — K760 Fatty (change of) liver, not elsewhere classified: Secondary | ICD-10-CM

## 2024-01-25 NOTE — Progress Notes (Signed)
 Gastroenterology Office Note     Primary Care Physician:  Artemisa Bile, MD  Primary Gastroenterologist: Dr. Riley Cheadle    Chief Complaint   Chief Complaint  Patient presents with   fatty liver    Referred for fatty liver found on ultrasound.      History of Present Illness   Erica Lynch is a 63 y.o. female presenting today with a history of GERD, dyspepsia, last seen in April 2024, referred today due to hepatic steatosis seen on renal ultrasound.   Renal ultrasound completed and in Epic. Diffuse hepatic steatosis on US . Recent chol 181, HDL 56, LDL 107, AST 21, ALT 18 on outside labs from dietician. These will be scanned into epic. Chol and LDL had actually improved from Nov 2024 labs as she made dietary changes.   Started to see a dietician, who is actually her niece. She has changed her diet. Cutting out process foods.   No ETOH. No hx of DM.   GERD controlled without any PPI. No dysphagia. No abdominal pain, N/V. No overt Gi bleeding.    EGD 2022:mild Schatzki ring, medium-sized hiatal hernia, duodenum normal, s/p dilation  Colonoscopy 2022: hyperplastic polyp.   No FH colon cancer or polyps Father: cirrhosis felt secondary to alcohol    Past Medical History:  Diagnosis Date   Anxiety    Atypical mole 04/03/1996   moderate atypia - upper anterior left thigh (no treatment)   Atypical mole 09/11/1996   slight to moderate atypia - left buttocks (no treatment)   Atypical mole 11/19/1996   slight atypia - mid left paraspinal (no treatment)   Atypical mole 04/04/2001   slight atypia - right scapula (no treatment)   Atypical mole 12/22/2001   mild atypia - right mid calf (no treatment)   Atypical mole 12/22/2001   moderate atypia - right anterior neck (widershave)   Atypical mole 11/23/2007   moderate atypia - right upper back (widershave)   Atypical mole 07/21/2011   mild atypia - right upper inner thigh   Atypical mole 07/21/2011   mild atypia - right  calf   Atypical mole 02/04/2016   moderate atypia - left calf (widershave)   Chronic kidney disease    Depression    Fatigue 12/04/2015   Fatty liver    GERD (gastroesophageal reflux disease)    Hematuria 05/22/2014   History of breast lump 02/19/2014   Hot flashes 12/04/2015   Hyperlipidemia    IBS (irritable bowel syndrome)    Left breast mass 11/07/2013   LLQ pain 05/22/2014   Lumbar herniated disc 07/2015   Other and unspecified ovarian cyst 05/23/2014   Squamous cell carcinoma of skin 12/22/2001   right inner cheek - CX3 + excision (01/26/2002)   Squamous cell carcinoma of skin 12/22/2001   left outer cheek - CX (02/23/2002)   Squamous cell carcinoma of skin 07/13/2017   right chest - tx p bx   Unspecified symptom associated with female genital organs 05/22/2014   Vitamin D  deficiency 12/05/2015    Past Surgical History:  Procedure Laterality Date   CESAREAN SECTION  1996   COLONOSCOPY  08/2011   Dr. Riley Cheadle: normal, next colonoscopy December 2022.   COLONOSCOPY WITH PROPOFOL  N/A 09/07/2021   non-bleeding internal hemorrhoids, one 5 mm polyp in sigmoid, sigmoid and descending colon diverticulosis. Hyperplastic polyp.   ENDOMETRIAL ABLATION  2-3 yrs ago   [facility]   ESOPHAGOGASTRODUODENOSCOPY (EGD) WITH PROPOFOL  N/A 11/21/2017   Dr.  Rourk: Normal esophagus, status post dilation, small hiatal hernia   ESOPHAGOGASTRODUODENOSCOPY (EGD) WITH PROPOFOL  N/A 09/07/2021   mild Schatzki ring, medium-sized hiatal hernia, duodenum normal, s/p dilation.   MALONEY DILATION N/A 11/21/2017   Procedure: Londa Rival DILATION;  Surgeon: Suzette Espy, MD;  [facility];  Service: Endoscopy;  Laterality: N/A;   MALONEY DILATION  09/07/2021   Procedure: Londa Rival DILATION;  Surgeon: Suzette Espy, MD;  [facility];  Service: Endoscopy;;   OTHER SURGICAL HISTORY     Uterine ablation   POLYPECTOMY  09/07/2021   Procedure: POLYPECTOMY;  Surgeon: Suzette Espy, MD;   [facility];  Service: Endoscopy;;   skin cancer removal Left 11/2023   left arm   TUBAL LIGATION  6 yrs ago   [facility]    Current Outpatient Medications  Medication Sig Dispense Refill   buPROPion  (WELLBUTRIN  XL) 300 MG 24 hr tablet Take 450 mg by mouth daily.     clonazePAM  (KLONOPIN ) 1 MG tablet Take 1 tablet (1 mg total) by mouth 2 (two) times daily as needed for anxiety. (Patient taking differently: Take 1 mg by mouth at bedtime.) 60 tablet 5   estradiol  (ESTRACE ) 0.1 MG/GM vaginal cream USE ONE GRAM IN VAGINA TWICE WEEKLY 42.5 g 1   Multiple Vitamin (MULTIVITAMIN) capsule Take 1 capsule by mouth daily.     No current facility-administered medications for this visit.    Allergies as of 01/25/2024   (No Known Allergies)    Family History  Problem Relation Age of Onset   Lung cancer Mother        deceased from lung cancer at age 7   Breast cancer Mother    Cancer Mother        breast at age 61   Depression Mother    Cirrhosis Father        etoh    Anxiety disorder Father    Alcohol abuse Father    Depression Father    Diabetes Maternal Grandfather    Heart disease Maternal Grandfather    Depression Maternal Grandmother    Depression Maternal Uncle    Depression Maternal Uncle    Anesthesia problems Neg Hx    Hypotension Neg Hx    Malignant hyperthermia Neg Hx    Pseudochol deficiency Neg Hx    Colon cancer Neg Hx    Colon polyps Neg Hx     Social History   Socioeconomic History   Marital status: Married    Spouse name: Not on file   Number of children: 1   Years of education: Not on file   Highest education level: Not on file  Occupational History   Occupation: Librarian, 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[employer]  Tobacco Use   Smoking status: Never    Passive exposure: Past   Smokeless tobacco: Never  Vaping Use   Vaping status: Never Used  Substance and Sexual Activity   Alcohol use: Yes    Comment: rarely   Drug use: No   Sexual  activity: Not Currently    Birth control/protection: Surgical, Post-menopausal    Comment: tubal and ablation  Other Topics Concern   Not on file  Social History Narrative   1 son-16         Social Drivers of Health   Financial Resource Strain: Low Risk  (12/09/2023)   Overall Financial Resource Strain (CARDIA)    Difficulty of Paying Living Expenses: Not hard  at all  Food Insecurity: No Food Insecurity (12/09/2023)   Hunger Vital Sign    Worried About Running Out of Food in the Last Year: Never true    Ran Out of Food in the Last Year: Never true  Transportation Needs: No Transportation Needs (12/09/2023)   PRAPARE - Administrator, Civil Service (Medical): No    Lack of Transportation (Non-Medical): No  Physical Activity: Insufficiently Active (12/09/2023)   Exercise Vital Sign    Days of Exercise per Week: 2 days    Minutes of Exercise per Session: 30 min  Stress: No Stress Concern Present (12/09/2023)   [employer] of Occupational Health - Occupational Stress Questionnaire    Feeling of Stress : Only a little  Social Connections: Socially Integrated (12/09/2023)   Social Connection and Isolation Panel [NHANES]    Frequency of Communication with Friends and Family: More than three times a week    Frequency of Social Gatherings with Friends and Family: Once a week    Attends Religious Services: More than 4 times per year    Active Member of [employer] or Organizations: Yes    Attends Engineer, structural: More than 4 times per year    Marital Status: Married  Catering manager Violence: Not At Risk (12/09/2023)   Humiliation, Afraid, Rape, and Kick questionnaire    Fear of Current or Ex-Partner: No    Emotionally Abused: No    Physically Abused: No    Sexually Abused: No     Review of Systems   Gen: Denies any fever, chills, fatigue, weight loss, lack of appetite.  CV: Denies chest pain, heart palpitations, peripheral edema, syncope.  Resp: Denies  shortness of breath at rest or with exertion. Denies wheezing or cough.  GI: Denies dysphagia or odynophagia. Denies jaundice, hematemesis, fecal incontinence. GU : Denies urinary burning, urinary frequency, urinary hesitancy MS: Denies joint pain, muscle weakness, cramps, or limitation of movement.  Derm: Denies rash, itching, dry skin Psych: Denies depression, anxiety, memory loss, and confusion Heme: Denies bruising, bleeding, and enlarged lymph nodes.   Physical Exam   BP 113/79   Pulse 87   Temp (!) 97.5 F (36.4 C)   Ht 5\' 4"  (1.626 m)   Wt 198 lb 6.4 oz (90 kg)   BMI 34.06 kg/m  General:   Alert and oriented. Pleasant and cooperative. Well-nourished and well-developed.  Head:  Normocephalic and atraumatic. Eyes:  Without icterus Abdomen:  +BS, soft, non-tender and non-distended. No HSM noted. No guarding or rebound. No masses appreciated.  Rectal:  Deferred  Msk:  Symmetrical without gross deformities. Normal posture. Extremities:  Without edema. Neurologic:  Alert and  oriented x4;  grossly normal neurologically. Skin:  Intact without significant lesions or rashes. Psych:  Alert and cooperative. Normal mood and affect.   Assessment   Erica Lynch is a 63 y.o. female presenting today with a history of  GERD, dyspepsia, last seen in April 2024, referred today due to hepatic steatosis seen on renal ultrasound.   Hepatic steatosis: with normal transaminases recently. Lipids improved with dietary changes, seeing a nutritionist. She is actively working on diet and behavior changes. Will check CBC, ELF, celiac panel, and dedicated US . Father did have history of cirrhosis but was felt secondary to alcohol. Check serologies if elevated LFTs.   GERD: no PPI and controlled with diet changes.   Colonoscopy up-to-date as of 2022. 10 year screening recommended.    PLAN  CBC, ELF, HFP, celiac panel US  abdomen complete Continue diet/behavior changes 6 month follow-up or  sooner if needed   Delman Ferns, PhD, ANP-BC [facility]

## 2024-01-25 NOTE — Patient Instructions (Signed)
 It was great to see you today!  Keep up the awesome work with diet and behavior changes.  I have ordered an ultrasound and labs to assess stiffness of liver.   I will be in touch with results!  We will see you in 6 months!  I enjoyed seeing you again today! I value our relationship and want to provide genuine, compassionate, and quality care. You may receive a survey regarding your visit with me, and I welcome your feedback! Thanks so much for taking the time to complete this. I look forward to seeing you again.      Delman Ferns, PhD, ANP-BC [facility]

## 2024-01-30 DIAGNOSIS — K760 Fatty (change of) liver, not elsewhere classified: Secondary | ICD-10-CM | POA: Diagnosis not present

## 2024-02-01 LAB — CBC WITH DIFFERENTIAL/PLATELET
Basophils Absolute: 0.1 10*3/uL (ref 0.0–0.2)
Basos: 1 %
EOS (ABSOLUTE): 0.5 10*3/uL — ABNORMAL HIGH (ref 0.0–0.4)
Eos: 7 %
Hematocrit: 44.2 % (ref 34.0–46.6)
Hemoglobin: 14.5 g/dL (ref 11.1–15.9)
Immature Grans (Abs): 0 10*3/uL (ref 0.0–0.1)
Immature Granulocytes: 0 %
Lymphocytes Absolute: 2.5 10*3/uL (ref 0.7–3.1)
Lymphs: 34 %
MCH: 30.8 pg (ref 26.6–33.0)
MCHC: 32.8 g/dL (ref 31.5–35.7)
MCV: 94 fL (ref 79–97)
Monocytes Absolute: 0.4 10*3/uL (ref 0.1–0.9)
Monocytes: 6 %
Neutrophils Absolute: 3.8 10*3/uL (ref 1.4–7.0)
Neutrophils: 52 %
Platelets: 314 10*3/uL (ref 150–450)
RBC: 4.71 x10E6/uL (ref 3.77–5.28)
RDW: 11.9 % (ref 11.7–15.4)
WBC: 7.2 10*3/uL (ref 3.4–10.8)

## 2024-02-01 LAB — ENHANCED LIVER FIBROSIS (ELF): ELF(TM) Score: 8.28 (ref <9.80)

## 2024-02-01 LAB — CELIAC DISEASE PANEL: IgA/Immunoglobulin A, Serum: 253 mg/dL (ref 87–352)

## 2024-02-02 ENCOUNTER — Ambulatory Visit (HOSPITAL_COMMUNITY)
Admission: RE | Admit: 2024-02-02 | Discharge: 2024-02-02 | Disposition: A | Discharge status: Home / Self Care | Source: Ambulatory Visit | Attending: Gastroenterology | Admitting: Gastroenterology

## 2024-02-02 DIAGNOSIS — K760 Fatty (change of) liver, not elsewhere classified: Secondary | ICD-10-CM | POA: Insufficient documentation

## 2024-02-03 DIAGNOSIS — Z713 Dietary counseling and surveillance: Secondary | ICD-10-CM | POA: Diagnosis not present

## 2024-02-07 ENCOUNTER — Ambulatory Visit: Payer: Self-pay | Admitting: Gastroenterology

## 2024-02-07 ENCOUNTER — Other Ambulatory Visit: Payer: Self-pay

## 2024-02-07 DIAGNOSIS — K760 Fatty (change of) liver, not elsewhere classified: Secondary | ICD-10-CM

## 2024-02-07 NOTE — Telephone Encounter (Signed)
 Mandy, please send US  results to Dr. Hildy Lowers. Thanks!

## 2024-02-08 ENCOUNTER — Other Ambulatory Visit (HOSPITAL_COMMUNITY)

## 2024-02-09 DIAGNOSIS — K760 Fatty (change of) liver, not elsewhere classified: Secondary | ICD-10-CM | POA: Diagnosis not present

## 2024-02-10 LAB — HEPATIC FUNCTION PANEL
ALT: 20 IU/L (ref 0–32)
AST: 19 IU/L (ref 0–40)
Albumin: 4.3 g/dL (ref 3.9–4.9)
Alkaline Phosphatase: 62 IU/L (ref 44–121)
Bilirubin Total: 0.3 mg/dL (ref 0.0–1.2)
Bilirubin, Direct: 0.09 mg/dL (ref 0.00–0.40)
Total Protein: 6.4 g/dL (ref 6.0–8.5)

## 2024-02-11 DIAGNOSIS — Z713 Dietary counseling and surveillance: Secondary | ICD-10-CM | POA: Diagnosis not present

## 2024-02-17 DIAGNOSIS — Z713 Dietary counseling and surveillance: Secondary | ICD-10-CM | POA: Diagnosis not present

## 2024-02-22 ENCOUNTER — Ambulatory Visit: Payer: Self-pay | Admitting: Gastroenterology

## 2024-02-25 DIAGNOSIS — Z713 Dietary counseling and surveillance: Secondary | ICD-10-CM | POA: Diagnosis not present

## 2024-03-02 DIAGNOSIS — Z713 Dietary counseling and surveillance: Secondary | ICD-10-CM | POA: Diagnosis not present

## 2024-03-09 DIAGNOSIS — Z713 Dietary counseling and surveillance: Secondary | ICD-10-CM | POA: Diagnosis not present

## 2024-03-15 DIAGNOSIS — R519 Headache, unspecified: Secondary | ICD-10-CM | POA: Diagnosis not present

## 2024-03-15 DIAGNOSIS — N1831 Chronic kidney disease, stage 3a: Secondary | ICD-10-CM | POA: Diagnosis not present

## 2024-03-22 ENCOUNTER — Encounter: Payer: Self-pay | Admitting: Adult Health

## 2024-03-22 ENCOUNTER — Ambulatory Visit (INDEPENDENT_AMBULATORY_CARE_PROVIDER_SITE_OTHER): Admitting: Adult Health

## 2024-03-22 VITALS — BP 124/85 | HR 89 | Ht 64.0 in | Wt 194.0 lb

## 2024-03-22 DIAGNOSIS — N6321 Unspecified lump in the left breast, upper outer quadrant: Secondary | ICD-10-CM | POA: Diagnosis not present

## 2024-03-22 DIAGNOSIS — N644 Mastodynia: Secondary | ICD-10-CM | POA: Diagnosis not present

## 2024-03-22 DIAGNOSIS — N183 Chronic kidney disease, stage 3 unspecified: Secondary | ICD-10-CM | POA: Diagnosis not present

## 2024-03-22 DIAGNOSIS — K760 Fatty (change of) liver, not elsewhere classified: Secondary | ICD-10-CM | POA: Diagnosis not present

## 2024-03-22 NOTE — Progress Notes (Signed)
  Subjective:     Patient ID: Erica Lynch, female   DOB: 1961/09/14, 63 y.o.   MRN: [id number]  HPI Erica Lynch is a 63 year old white female, married, PM in complaining of left breast pain x 1 week.      Component Value Date/Time   DIAGPAP  12/09/2023 0938    - Negative for intraepithelial lesion or malignancy (NILM)   DIAGPAP - Atrophic vaginitis 12/09/2023 0938   DIAGPAP  07/06/2021 1541    - Negative for intraepithelial lesion or malignancy (NILM)   HPVHIGH Negative 12/09/2023 0938   HPVHIGH Negative 07/06/2021 1541   ADEQPAP  12/09/2023 0938    Satisfactory for evaluation. The presence or absence of an   ADEQPAP  12/09/2023 0938    endocervical/transformation zone component cannot be determined because   ADEQPAP of atrophy. 12/09/2023 9061   PCP is Dr Sheryle   Review of Systems Left breast pain x 1 week  Reviewed past medical,surgical, social and family history. Reviewed medications and allergies.     Objective:   Physical Exam BP 124/85 (BP Location: Left Arm, Patient Position: Sitting, Cuff Size: Normal)   Pulse 89   Ht 5' 4 (1.626 m)   Wt 194 lb (88 kg)   BMI 33.30 kg/m      Skin warm and dry,  Breasts:no dominate palpable mass, retraction or nipple discharge on the right, on the left, has pain and mass at 2 0'clock 4 FB from areola, no retraction or nipple discharge.  Upstream - 03/22/24 1530       Pregnancy Intention Screening   Does the patient want to become pregnant in the next year? N/A    Does the patient's partner want to become pregnant in the next year? N/A    Would the patient like to discuss contraceptive options today? N/A      Contraception Wrap Up   Current Method Female Sterilization;Abstinence   PM   End Method Female Sterilization;Abstinence   PM   Contraception Counseling Provided No    How was the end contraceptive method provided? N/A           Assessment:     1. Breast pain, left (Primary) Left diagnostic mammogram and US  scheduled at  [facility] fo 04/10/24 at 1:40 pm  - MM 3D DIAGNOSTIC MAMMOGRAM UNILATERAL LEFT BREAST; Future - US  LIMITED ULTRASOUND INCLUDING AXILLA LEFT BREAST ; Future Decrease caffeine  2. Mass of upper outer quadrant of left breast Left diagnostic mammogram and US  scheduled at [facility] fo 04/10/24 at 1:40 pm - MM 3D DIAGNOSTIC MAMMOGRAM UNILATERAL LEFT BREAST; Future - US  LIMITED ULTRASOUND INCLUDING AXILLA LEFT BREAST ; Future     Plan:     Follow up prn

## 2024-03-23 DIAGNOSIS — Z713 Dietary counseling and surveillance: Secondary | ICD-10-CM | POA: Diagnosis not present

## 2024-03-27 DIAGNOSIS — L718 Other rosacea: Secondary | ICD-10-CM | POA: Diagnosis not present

## 2024-03-27 DIAGNOSIS — L821 Other seborrheic keratosis: Secondary | ICD-10-CM | POA: Diagnosis not present

## 2024-04-06 DIAGNOSIS — Z713 Dietary counseling and surveillance: Secondary | ICD-10-CM | POA: Diagnosis not present

## 2024-04-10 ENCOUNTER — Ambulatory Visit: Payer: Self-pay | Admitting: Adult Health

## 2024-04-10 ENCOUNTER — Ambulatory Visit (HOSPITAL_COMMUNITY)
Admission: RE | Admit: 2024-04-10 | Discharge: 2024-04-10 | Disposition: A | Discharge status: Home / Self Care | Source: Ambulatory Visit | Attending: Adult Health | Admitting: Adult Health

## 2024-04-10 DIAGNOSIS — N644 Mastodynia: Secondary | ICD-10-CM | POA: Diagnosis not present

## 2024-04-10 DIAGNOSIS — R92333 Mammographic heterogeneous density, bilateral breasts: Secondary | ICD-10-CM | POA: Diagnosis not present

## 2024-04-10 DIAGNOSIS — N6321 Unspecified lump in the left breast, upper outer quadrant: Secondary | ICD-10-CM | POA: Insufficient documentation

## 2024-04-10 DIAGNOSIS — M79622 Pain in left upper arm: Secondary | ICD-10-CM | POA: Diagnosis not present

## 2024-04-10 DIAGNOSIS — Z803 Family history of malignant neoplasm of breast: Secondary | ICD-10-CM | POA: Diagnosis not present

## 2024-04-13 DIAGNOSIS — Z713 Dietary counseling and surveillance: Secondary | ICD-10-CM | POA: Diagnosis not present

## 2024-04-20 DIAGNOSIS — Z713 Dietary counseling and surveillance: Secondary | ICD-10-CM | POA: Diagnosis not present

## 2024-04-27 DIAGNOSIS — Z713 Dietary counseling and surveillance: Secondary | ICD-10-CM | POA: Diagnosis not present

## 2024-04-30 DIAGNOSIS — L718 Other rosacea: Secondary | ICD-10-CM | POA: Diagnosis not present

## 2024-04-30 DIAGNOSIS — L820 Inflamed seborrheic keratosis: Secondary | ICD-10-CM | POA: Diagnosis not present

## 2024-04-30 DIAGNOSIS — L538 Other specified erythematous conditions: Secondary | ICD-10-CM | POA: Diagnosis not present

## 2024-04-30 DIAGNOSIS — L2989 Other pruritus: Secondary | ICD-10-CM | POA: Diagnosis not present

## 2024-05-04 DIAGNOSIS — Z713 Dietary counseling and surveillance: Secondary | ICD-10-CM | POA: Diagnosis not present

## 2024-05-16 DIAGNOSIS — Z713 Dietary counseling and surveillance: Secondary | ICD-10-CM | POA: Diagnosis not present

## 2024-05-25 DIAGNOSIS — Z713 Dietary counseling and surveillance: Secondary | ICD-10-CM | POA: Diagnosis not present

## 2024-05-31 ENCOUNTER — Other Ambulatory Visit (HOSPITAL_COMMUNITY): Payer: Self-pay | Admitting: Adult Health

## 2024-05-31 DIAGNOSIS — Z1231 Encounter for screening mammogram for malignant neoplasm of breast: Secondary | ICD-10-CM

## 2024-05-31 DIAGNOSIS — L820 Inflamed seborrheic keratosis: Secondary | ICD-10-CM | POA: Diagnosis not present

## 2024-05-31 DIAGNOSIS — L718 Other rosacea: Secondary | ICD-10-CM | POA: Diagnosis not present

## 2024-06-01 DIAGNOSIS — Z713 Dietary counseling and surveillance: Secondary | ICD-10-CM | POA: Diagnosis not present

## 2024-06-08 DIAGNOSIS — Z713 Dietary counseling and surveillance: Secondary | ICD-10-CM | POA: Diagnosis not present

## 2024-06-14 ENCOUNTER — Encounter: Payer: Self-pay | Admitting: Gastroenterology

## 2024-06-15 DIAGNOSIS — Z713 Dietary counseling and surveillance: Secondary | ICD-10-CM | POA: Diagnosis not present

## 2024-06-22 ENCOUNTER — Ambulatory Visit (HOSPITAL_COMMUNITY)

## 2024-06-29 ENCOUNTER — Encounter (HOSPITAL_COMMUNITY): Payer: Self-pay

## 2024-06-29 ENCOUNTER — Ambulatory Visit (HOSPITAL_COMMUNITY)
Admission: RE | Admit: 2024-06-29 | Discharge: 2024-06-29 | Disposition: A | Discharge status: Home / Self Care | Source: Ambulatory Visit | Attending: Adult Health | Admitting: Adult Health

## 2024-06-29 DIAGNOSIS — Z1231 Encounter for screening mammogram for malignant neoplasm of breast: Secondary | ICD-10-CM | POA: Insufficient documentation

## 2024-07-03 ENCOUNTER — Ambulatory Visit: Payer: Self-pay | Admitting: Adult Health

## 2024-07-06 DIAGNOSIS — Z713 Dietary counseling and surveillance: Secondary | ICD-10-CM | POA: Diagnosis not present

## 2024-07-12 DIAGNOSIS — E119 Type 2 diabetes mellitus without complications: Secondary | ICD-10-CM | POA: Diagnosis not present

## 2024-07-13 LAB — LAB REPORT - SCANNED: EGFR: 59

## 2024-07-19 DIAGNOSIS — N183 Chronic kidney disease, stage 3 unspecified: Secondary | ICD-10-CM | POA: Diagnosis not present

## 2024-07-19 DIAGNOSIS — F331 Major depressive disorder, recurrent, moderate: Secondary | ICD-10-CM | POA: Diagnosis not present

## 2024-07-19 DIAGNOSIS — E66811 Obesity, class 1: Secondary | ICD-10-CM | POA: Diagnosis not present

## 2024-07-19 DIAGNOSIS — Z23 Encounter for immunization: Secondary | ICD-10-CM | POA: Diagnosis not present

## 2024-07-20 DIAGNOSIS — Z713 Dietary counseling and surveillance: Secondary | ICD-10-CM | POA: Diagnosis not present

## 2024-08-03 DIAGNOSIS — Z713 Dietary counseling and surveillance: Secondary | ICD-10-CM | POA: Diagnosis not present

## 2024-08-10 DIAGNOSIS — Z713 Dietary counseling and surveillance: Secondary | ICD-10-CM | POA: Diagnosis not present

## 2024-08-25 DIAGNOSIS — Z713 Dietary counseling and surveillance: Secondary | ICD-10-CM | POA: Diagnosis not present

## 2024-08-30 ENCOUNTER — Encounter: Payer: Self-pay | Admitting: Gastroenterology

## 2024-08-30 ENCOUNTER — Ambulatory Visit: Admitting: Gastroenterology

## 2024-08-30 VITALS — BP 136/86 | HR 86 | Temp 98.0°F | Ht 64.0 in | Wt 192.0 lb

## 2024-08-30 DIAGNOSIS — K760 Fatty (change of) liver, not elsewhere classified: Secondary | ICD-10-CM

## 2024-08-30 DIAGNOSIS — Z713 Dietary counseling and surveillance: Secondary | ICD-10-CM | POA: Diagnosis not present

## 2024-08-30 DIAGNOSIS — K648 Other hemorrhoids: Secondary | ICD-10-CM

## 2024-08-30 DIAGNOSIS — K649 Unspecified hemorrhoids: Secondary | ICD-10-CM

## 2024-08-30 MED ORDER — HYDROCORTISONE (PERIANAL) 2.5 % EX CREA: 1.0000 | TOPICAL_CREAM | Freq: Two times a day (BID) | CUTANEOUS | 1 refills | Status: AC

## 2024-08-30 NOTE — Patient Instructions (Signed)
 I recommend taking Benefiber 2 teaspoons each morning in the beverage of your choice. This can help with stool frequency and good bowel regimen.   I have sent in Anusol  cream to use twice a day per rectum for the next 5-7 days. After that, just use as needed.   Avoid straining, limit toilet time to 2-3 minutes, and avoid constipation. If this persists, let me know.  Have a great time with family, and Gertha Christmas!  I will see you in 6 months!  We will repeat the ELF score then.  I enjoyed seeing you again today! I value our relationship and want to provide genuine, compassionate, and quality care. You may receive a survey regarding your visit with me, and I welcome your feedback! Thanks so much for taking the time to complete this. I look forward to seeing you again.      Therisa MICAEL Stager, PhD, ANP-BC [facility]

## 2024-08-30 NOTE — Progress Notes (Signed)
 "   Gastroenterology Office Note     Primary Care Physician:  Sheryle Carwin, MD  Primary Gastroenterologist: Dr. Shaaron    Chief Complaint   Chief Complaint  Patient presents with   Follow-up    Having issues with painful/itching hemorrhoids and has recent lab results viewable under [facility] tab.     History of Present Illness   Erica Lynch is a 63 y.o. female presenting today with a history of GERD, dyspepsia, hepatic steatosis, low risk of fibrosis and ELF score 8.22 Feb 2024, returning for follow-up.  Prior evaluation:  Negative celiac disease panel, no anemia, LFTs normal.  FIB 4: Oct 2025 Tbili 0.3, AST 21, ALT 17, alk phos 75 1.04 points Advanced fibrosis excluded Approximate fibrosis stage: Ishak 0-1 (Sterling et al 2006)  Erica Lynch, Nutritionist, is also her niece. She sees Erica Lynch routinely and is working on [employer], exercise, diet/behavior modification. She is purposefully losing weight. Down 7 lbs from earlier this year.   Moved some furniture 2 weeks ago. Lots of straining with this. No bleeding. Itching. Hemorrhoids flared. Straining occasionally.      EGD 2022:mild Schatzki ring, medium-sized hiatal hernia, duodenum normal, s/p dilation  Colonoscopy 2022: hyperplastic polyp.    No FH colon cancer or polyps Father: cirrhosis felt secondary to alcohol    Past Medical History:  Diagnosis Date   Anxiety    Atypical mole 04/03/1996   moderate atypia - upper anterior left thigh (no treatment)   Atypical mole 09/11/1996   slight to moderate atypia - left buttocks (no treatment)   Atypical mole 11/19/1996   slight atypia - mid left paraspinal (no treatment)   Atypical mole 04/04/2001   slight atypia - right scapula (no treatment)   Atypical mole 12/22/2001   mild atypia - right mid calf (no treatment)   Atypical mole 12/22/2001   moderate atypia - right anterior neck (widershave)   Atypical mole 11/23/2007   moderate atypia - right upper back  (widershave)   Atypical mole 07/21/2011   mild atypia - right upper inner thigh   Atypical mole 07/21/2011   mild atypia - right calf   Atypical mole 02/04/2016   moderate atypia - left calf (widershave)   Chronic kidney disease    Depression    Fatigue 12/04/2015   Fatty liver    GERD (gastroesophageal reflux disease)    Hematuria 05/22/2014   History of breast lump 02/19/2014   Hot flashes 12/04/2015   Hyperlipidemia    IBS (irritable bowel syndrome)    Left breast mass 11/07/2013   LLQ pain 05/22/2014   Lumbar herniated disc 07/2015   Other and unspecified ovarian cyst 05/23/2014   Squamous cell carcinoma of skin 12/22/2001   right inner cheek - CX3 + excision (01/26/2002)   Squamous cell carcinoma of skin 12/22/2001   left outer cheek - CX (02/23/2002)   Squamous cell carcinoma of skin 07/13/2017   right chest - tx p bx   Unspecified symptom associated with female genital organs 05/22/2014   Vitamin D  deficiency 12/05/2015    Past Surgical History:  Procedure Laterality Date   CESAREAN SECTION  1996   COLONOSCOPY  08/2011   Dr. Shaaron: normal, next colonoscopy December 2022.   COLONOSCOPY WITH PROPOFOL  N/A 09/07/2021   non-bleeding internal hemorrhoids, one 5 mm polyp in sigmoid, sigmoid and descending colon diverticulosis. Hyperplastic polyp.   ENDOMETRIAL ABLATION  2-3 yrs ago   [facility]   ESOPHAGOGASTRODUODENOSCOPY (EGD) WITH PROPOFOL  N/A 11/21/2017  Dr. Shaaron: Normal esophagus, status post dilation, small hiatal hernia   ESOPHAGOGASTRODUODENOSCOPY (EGD) WITH PROPOFOL  N/A 09/07/2021   mild Schatzki ring, medium-sized hiatal hernia, duodenum normal, s/p dilation.   MALONEY DILATION N/A 11/21/2017   Procedure: AGAPITO DILATION;  Surgeon: Shaaron Lamar HERO, MD;  [facility];  Service: Endoscopy;  Laterality: N/A;   MALONEY DILATION  09/07/2021   Procedure: AGAPITO DILATION;  Surgeon: Shaaron Lamar HERO, MD;  [facility];  Service: Endoscopy;;    OTHER SURGICAL HISTORY     Uterine ablation   POLYPECTOMY  09/07/2021   Procedure: POLYPECTOMY;  Surgeon: Shaaron Lamar HERO, MD;  [facility];  Service: Endoscopy;;   skin cancer removal Left 11/2023   left arm   TUBAL LIGATION  6 yrs ago   [facility]    Current Outpatient Medications  Medication Sig Dispense Refill   buPROPion  (WELLBUTRIN  XL) 150 MG 24 hr tablet Take 450 mg by mouth daily.     clonazePAM  (KLONOPIN ) 1 MG tablet Take 1 tablet (1 mg total) by mouth 2 (two) times daily as needed for anxiety. 60 tablet 5   estradiol  (ESTRACE ) 0.1 MG/GM vaginal cream USE ONE GRAM IN VAGINA TWICE WEEKLY 42.5 g 1   No current facility-administered medications for this visit.    Allergies as of 08/30/2024   (No Known Allergies)    Family History  Problem Relation Age of Onset   Lung cancer Mother        deceased from lung cancer at age 63   Breast cancer Mother 70 - 38   Cancer Mother        breast at age 52   Depression Mother    Cirrhosis Father        etoh    Anxiety disorder Father    Alcohol abuse Father    Depression Father    Depression Maternal Uncle    Depression Maternal Uncle    Depression Maternal Grandmother    Diabetes Maternal Grandfather    Heart disease Maternal Grandfather    Anesthesia problems Neg Hx    Hypotension Neg Hx    Malignant hyperthermia Neg Hx    Pseudochol deficiency Neg Hx    Colon cancer Neg Hx    Colon polyps Neg Hx     Social History   Socioeconomic History   Marital status: Married    Spouse name: Not on file   Number of children: 1   Years of education: Not on file   Highest education level: Not on file  Occupational History   Occupation: Librarian, Academic: [employer]  Tobacco Use   Smoking status: Never    Passive exposure: Past   Smokeless tobacco: Never  Vaping Use   Vaping status: Never Used  Substance and Sexual Activity   Alcohol use: Yes    Comment: rarely   Drug use: No    Sexual activity: Not Currently    Birth control/protection: Surgical, Post-menopausal    Comment: tubal and ablation  Other Topics Concern   Not on file  Social History Narrative   1 son-16         Social Drivers of Health   Financial Resource Strain: Low Risk  (12/09/2023)   Overall Financial Resource Strain (CARDIA)    Difficulty of Paying Living Expenses: Not hard at all  Food Insecurity: No Food Insecurity (12/09/2023)   Hunger Vital Sign    Worried About Running Out  of Food in the Last Year: Never true    Ran Out of Food in the Last Year: Never true  Transportation Needs: No Transportation Needs (12/09/2023)   PRAPARE - Administrator, Civil Service (Medical): No    Lack of Transportation (Non-Medical): No  Physical Activity: Insufficiently Active (12/09/2023)   Exercise Vital Sign    Days of Exercise per Week: 2 days    Minutes of Exercise per Session: 30 min  Stress: No Stress Concern Present (12/09/2023)   Erica Lynch of Occupational Health - Occupational Stress Questionnaire    Feeling of Stress : Only a little  Social Connections: Socially Integrated (12/09/2023)   Social Connection and Isolation Panel    Frequency of Communication with Friends and Family: More than three times a week    Frequency of Social Gatherings with Friends and Family: Once a week    Attends Religious Services: More than 4 times per year    Active Member of [employer] or Organizations: Yes    Attends Engineer, Structural: More than 4 times per year    Marital Status: Married  Catering Manager Violence: Not At Risk (12/09/2023)   Humiliation, Afraid, Rape, and Kick questionnaire    Fear of Current or Ex-Partner: No    Emotionally Abused: No    Physically Abused: No    Sexually Abused: No     Review of Systems   Gen: Denies any fever, chills, fatigue, weight loss, lack of appetite.  CV: Denies chest pain, heart palpitations, peripheral edema, syncope.  Resp: Denies  shortness of breath at rest or with exertion. Denies wheezing or cough.  GI: Denies dysphagia or odynophagia. Denies jaundice, hematemesis, fecal incontinence. GU : Denies urinary burning, urinary frequency, urinary hesitancy MS: Denies joint pain, muscle weakness, cramps, or limitation of movement.  Derm: Denies rash, itching, dry skin Psych: Denies depression, anxiety, memory loss, and confusion Heme: Denies bruising, bleeding, and enlarged lymph nodes.   Physical Exam   BP 136/86   Pulse 86   Temp 98 F (36.7 C) (Oral)   Ht 5' 4 (1.626 m)   Wt 192 lb (87.1 kg)   LMP  (LMP Unknown)   SpO2 97%   BMI 32.96 kg/m  General:   Alert and oriented. Pleasant and cooperative. Well-nourished and well-developed.  Head:  Normocephalic and atraumatic. Eyes:  Without icterus Abdomen:  +BS, soft, non-tender and non-distended. No HSM noted. No guarding or rebound. No masses appreciated.  Rectal:  no external hemorrhoids, no mass on DRE, slight prominence of left lateral and right anterior columns Msk:  Symmetrical without gross deformities. Normal posture. Extremities:  Without edema. Neurologic:  Alert and  oriented x4;  grossly normal neurologically. Skin:  Intact without significant lesions or rashes. Psych:  Alert and cooperative. Normal mood and affect.   Assessment   Hepatic steatosis without fibrosis  Symptomatic hemorrhoids s/p heavy lifting    PLAN    Return 6 months ELF in 6 months and will follow serially. Low risk fibrosis.  Continue follow-up with nutritionist, excellent diet/behavior modification Benefiber daily, anusol  for next 5-7 days, avoid straining, constipation Return in 6 months   Therisa MICAEL Stager, PhD, ANP-BC [facility]    "

## 2024-09-07 DIAGNOSIS — Z713 Dietary counseling and surveillance: Secondary | ICD-10-CM | POA: Diagnosis not present

## 2024-09-21 ENCOUNTER — Ambulatory Visit: Payer: Self-pay
